# Patient Record
Sex: Female | Born: 1955 | Race: White | Hispanic: No | Marital: Married | Smoking: Former smoker
Health system: Southern US, Community
[De-identification: ages and names within clinical notes are randomized; demographics above are authoritative.]

## PROBLEM LIST (undated history)

## (undated) DIAGNOSIS — R55 Syncope and collapse: Secondary | ICD-10-CM

## (undated) DIAGNOSIS — Z86010 Personal history of colonic polyps: Secondary | ICD-10-CM

## (undated) DIAGNOSIS — F32A Depression, unspecified: Secondary | ICD-10-CM

## (undated) DIAGNOSIS — Z860101 Personal history of adenomatous and serrated colon polyps: Secondary | ICD-10-CM

## (undated) DIAGNOSIS — F329 Major depressive disorder, single episode, unspecified: Secondary | ICD-10-CM

## (undated) DIAGNOSIS — I6523 Occlusion and stenosis of bilateral carotid arteries: Secondary | ICD-10-CM

## (undated) DIAGNOSIS — K219 Gastro-esophageal reflux disease without esophagitis: Secondary | ICD-10-CM

## (undated) DIAGNOSIS — C519 Malignant neoplasm of vulva, unspecified: Secondary | ICD-10-CM

## (undated) DIAGNOSIS — J439 Emphysema, unspecified: Secondary | ICD-10-CM

## (undated) DIAGNOSIS — I2510 Atherosclerotic heart disease of native coronary artery without angina pectoris: Secondary | ICD-10-CM

## (undated) DIAGNOSIS — I495 Sick sinus syndrome: Secondary | ICD-10-CM

## (undated) DIAGNOSIS — R001 Bradycardia, unspecified: Secondary | ICD-10-CM

## (undated) DIAGNOSIS — D649 Anemia, unspecified: Secondary | ICD-10-CM

## (undated) DIAGNOSIS — M1990 Unspecified osteoarthritis, unspecified site: Secondary | ICD-10-CM

## (undated) DIAGNOSIS — I341 Nonrheumatic mitral (valve) prolapse: Secondary | ICD-10-CM

## (undated) DIAGNOSIS — I639 Cerebral infarction, unspecified: Secondary | ICD-10-CM

## (undated) DIAGNOSIS — I4891 Unspecified atrial fibrillation: Secondary | ICD-10-CM

## (undated) DIAGNOSIS — F419 Anxiety disorder, unspecified: Secondary | ICD-10-CM

## (undated) DIAGNOSIS — I10 Essential (primary) hypertension: Secondary | ICD-10-CM

## (undated) DIAGNOSIS — G459 Transient cerebral ischemic attack, unspecified: Secondary | ICD-10-CM

## (undated) DIAGNOSIS — E785 Hyperlipidemia, unspecified: Secondary | ICD-10-CM

## (undated) DIAGNOSIS — F5104 Psychophysiologic insomnia: Secondary | ICD-10-CM

## (undated) HISTORY — PX: BACK SURGERY: SHX140

## (undated) HISTORY — PX: INTESTINAL MALROTATION REPAIR: SHX411

## (undated) HISTORY — PX: BREAST EXCISIONAL BIOPSY: SUR124

## (undated) HISTORY — PX: DILATION AND CURETTAGE OF UTERUS: SHX78

## (undated) HISTORY — PX: LUMBAR DISC SURGERY: SHX700

## (undated) HISTORY — PX: CARDIAC CATHETERIZATION: SHX172

## (undated) HISTORY — PX: DIAGNOSTIC LAPAROSCOPY: SUR761

## (undated) HISTORY — PX: COLONOSCOPY: SHX174

---

## 1995-01-23 HISTORY — PX: OOPHORECTOMY: SHX86

## 1995-01-23 HISTORY — PX: ABDOMINAL HYSTERECTOMY: SHX81

## 1996-01-23 DIAGNOSIS — C519 Malignant neoplasm of vulva, unspecified: Secondary | ICD-10-CM

## 1996-01-23 HISTORY — DX: Malignant neoplasm of vulva, unspecified: C51.9

## 1996-01-23 HISTORY — PX: VULVECTOMY PARTIAL: SHX6187

## 2003-01-23 HISTORY — PX: APPENDECTOMY: SHX54

## 2003-01-23 HISTORY — PX: CHOLECYSTECTOMY OPEN: SUR202

## 2003-03-02 ENCOUNTER — Other Ambulatory Visit: Payer: Self-pay

## 2004-06-21 ENCOUNTER — Observation Stay: Payer: Self-pay | Admitting: Internal Medicine

## 2004-06-21 ENCOUNTER — Other Ambulatory Visit: Payer: Self-pay

## 2006-01-22 DIAGNOSIS — R55 Syncope and collapse: Secondary | ICD-10-CM

## 2006-01-22 HISTORY — DX: Syncope and collapse: R55

## 2006-07-12 ENCOUNTER — Emergency Department (HOSPITAL_COMMUNITY): Admission: EM | Admit: 2006-07-12 | Discharge: 2006-07-13 | Payer: Self-pay | Admitting: Emergency Medicine

## 2006-10-24 ENCOUNTER — Other Ambulatory Visit: Payer: Self-pay

## 2006-10-24 ENCOUNTER — Inpatient Hospital Stay: Payer: Self-pay | Admitting: Internal Medicine

## 2006-10-29 ENCOUNTER — Ambulatory Visit: Payer: Self-pay | Admitting: Internal Medicine

## 2008-11-02 ENCOUNTER — Emergency Department: Payer: Self-pay | Admitting: Emergency Medicine

## 2008-11-03 ENCOUNTER — Ambulatory Visit: Payer: Self-pay | Admitting: Orthopedic Surgery

## 2008-11-05 ENCOUNTER — Ambulatory Visit: Payer: Self-pay | Admitting: Orthopedic Surgery

## 2008-11-12 ENCOUNTER — Ambulatory Visit: Payer: Self-pay | Admitting: Unknown Physician Specialty

## 2008-11-16 ENCOUNTER — Ambulatory Visit: Payer: Self-pay | Admitting: Unknown Physician Specialty

## 2009-05-07 ENCOUNTER — Ambulatory Visit: Payer: Self-pay

## 2009-09-02 ENCOUNTER — Emergency Department: Payer: Self-pay | Admitting: Emergency Medicine

## 2009-11-11 ENCOUNTER — Emergency Department: Payer: Self-pay | Admitting: Emergency Medicine

## 2010-06-02 ENCOUNTER — Ambulatory Visit: Payer: Self-pay | Admitting: Unknown Physician Specialty

## 2010-11-08 LAB — ALKALINE PHOSPHATASE: Alkaline Phosphatase: 80

## 2011-02-01 ENCOUNTER — Emergency Department: Payer: Self-pay | Admitting: Unknown Physician Specialty

## 2011-02-01 LAB — CBC
HGB: 14 g/dL (ref 12.0–16.0)
MCHC: 33.1 g/dL (ref 32.0–36.0)
Platelet: 243 10*3/uL (ref 150–440)
RDW: 14 % (ref 11.5–14.5)

## 2011-02-01 LAB — CK TOTAL AND CKMB (NOT AT ARMC)
CK, Total: 77 U/L (ref 21–215)
CK-MB: 1 ng/mL (ref 0.5–3.6)

## 2011-02-01 LAB — COMPREHENSIVE METABOLIC PANEL
Alkaline Phosphatase: 60 U/L (ref 50–136)
BUN: 19 mg/dL — ABNORMAL HIGH (ref 7–18)
Bilirubin,Total: 0.5 mg/dL (ref 0.2–1.0)
Creatinine: 0.74 mg/dL (ref 0.60–1.30)
EGFR (Non-African Amer.): >60
Glucose: 120 mg/dL — ABNORMAL HIGH (ref 65–99)
SGOT(AST): 17 U/L (ref 15–37)
SGPT (ALT): 23 U/L

## 2011-07-17 ENCOUNTER — Ambulatory Visit: Payer: Self-pay | Admitting: Internal Medicine

## 2012-01-11 ENCOUNTER — Ambulatory Visit: Payer: Self-pay | Admitting: General Practice

## 2012-09-16 ENCOUNTER — Ambulatory Visit: Payer: Self-pay

## 2012-09-26 ENCOUNTER — Ambulatory Visit: Payer: Self-pay

## 2012-10-02 ENCOUNTER — Ambulatory Visit: Payer: Self-pay

## 2012-10-02 HISTORY — PX: BREAST BIOPSY: SHX20

## 2013-03-30 ENCOUNTER — Ambulatory Visit: Payer: Self-pay | Admitting: Unknown Physician Specialty

## 2013-04-02 LAB — PATHOLOGY REPORT

## 2013-09-17 ENCOUNTER — Ambulatory Visit: Payer: Self-pay

## 2014-05-06 ENCOUNTER — Observation Stay
Admit: 2014-05-06 | Disposition: A | Discharge status: Home / Self Care | Payer: Self-pay | Attending: Internal Medicine | Admitting: Internal Medicine

## 2014-05-06 LAB — APTT: Activated PTT: 29 secs (ref 23.6–35.9)

## 2014-05-06 LAB — PROTIME-INR
INR: 1
PROTHROMBIN TIME: 13.6 s

## 2014-05-06 LAB — CBC
HCT: 38.1 % (ref 35.0–47.0)
HGB: 12.6 g/dL (ref 12.0–16.0)
MCH: 28.3 pg (ref 26.0–34.0)
MCHC: 33 g/dL (ref 32.0–36.0)
MCV: 86 fL (ref 80–100)
PLATELETS: 235 10*3/uL (ref 150–440)
RBC: 4.45 10*6/uL (ref 3.80–5.20)
RDW: 14 % (ref 11.5–14.5)
WBC: 7.5 10*3/uL (ref 3.6–11.0)

## 2014-05-06 LAB — BASIC METABOLIC PANEL
ANION GAP: 7 (ref 7–16)
BUN: 18 mg/dL
CALCIUM: 8.7 mg/dL — AB
CHLORIDE: 106 mmol/L
Co2: 24 mmol/L
Creatinine: 0.7 mg/dL
EGFR (African American): >60
Glucose: 124 mg/dL — ABNORMAL HIGH
Potassium: 3.5 mmol/L
Sodium: 137 mmol/L

## 2014-05-06 LAB — TROPONIN I: Troponin-I: <0.03 ng/mL

## 2014-05-06 LAB — CK TOTAL AND CKMB (NOT AT ARMC)
CK, Total: 58 U/L
CK, Total: 57 U/L
CK, Total: 58 U/L
CK-MB: 1.3 ng/mL
CK-MB: 1.4 ng/mL
CK-MB: 1.4 ng/mL

## 2014-05-06 LAB — PRO B NATRIURETIC PEPTIDE: B-TYPE NATIURETIC PEPTID: 65 pg/mL

## 2014-05-07 LAB — LIPID PANEL
CHOLESTEROL: 212 mg/dL — AB
HDL Cholesterol: 57 mg/dL
LDL CHOLESTEROL, CALC: 120 mg/dL — AB
Triglycerides: 177 mg/dL — ABNORMAL HIGH
VLDL CHOLESTEROL, CALC: 35 mg/dL

## 2014-05-07 LAB — BASIC METABOLIC PANEL
Anion Gap: 3 — ABNORMAL LOW (ref 7–16)
BUN: 15 mg/dL
CALCIUM: 8.4 mg/dL — AB
CHLORIDE: 107 mmol/L
CO2: 27 mmol/L
Creatinine: 0.63 mg/dL
EGFR (African American): >60
EGFR (Non-African Amer.): >60
Glucose: 112 mg/dL — ABNORMAL HIGH
POTASSIUM: 3.9 mmol/L
SODIUM: 137 mmol/L

## 2014-05-07 LAB — CBC WITH DIFFERENTIAL/PLATELET
BASOS PCT: 0.4 %
Basophil #: 0 10*3/uL (ref 0.0–0.1)
EOS PCT: 0.9 %
Eosinophil #: 0.1 10*3/uL (ref 0.0–0.7)
HCT: 36.9 % (ref 35.0–47.0)
HGB: 12.2 g/dL (ref 12.0–16.0)
LYMPHS ABS: 1.8 10*3/uL (ref 1.0–3.6)
Lymphocyte %: 26.7 %
MCH: 28.2 pg (ref 26.0–34.0)
MCHC: 33 g/dL (ref 32.0–36.0)
MCV: 86 fL (ref 80–100)
MONO ABS: 0.6 x10 3/mm (ref 0.2–0.9)
Monocyte %: 9.3 %
NEUTROS ABS: 4.3 10*3/uL (ref 1.4–6.5)
Neutrophil %: 62.7 %
Platelet: 227 10*3/uL (ref 150–440)
RBC: 4.32 10*6/uL (ref 3.80–5.20)
RDW: 14 % (ref 11.5–14.5)
WBC: 6.9 10*3/uL (ref 3.6–11.0)

## 2014-05-23 NOTE — Discharge Summary (Signed)
PATIENT NAME:  Renee [name], Renee [name] MR#:  [id number] DATE OF BIRTH:  1955/07/19  DATE OF ADMISSION:  05/06/2014 DATE OF DISCHARGE:  05/07/2014  PRESENTING COMPLAINT: Chest pain.   DISCHARGE DIAGNOSES: 1. Chest pain with cardiac work-up essentially negative.  2. Hypertension.  3. Myoview stress test negative. Ejection fraction 60%.   CODE STATUS: FULL CODE.   MEDICATIONS: 1. Zolpidem 5 mg 1 tablet at bedtime.  2. Aspirin 81 mg daily.  3. Fluoxetine 20 mg 2 tablets p.o. daily.  4. Folic acid 0.8 mg p.o. daily.  5. Acetaminophen/hydrocodone 325/5 one tablet 4 times a day as needed.  6. Ferrous fumarate 50 mg p.o. daily.  7. Losartan 50 mg daily.  8. Multivitamin p.o. daily.  9. Omeprazole 40 mg daily.  10. Premarin 0.625 p.o. daily.  11. Vitamin D3 at 1000 international units p.o. daily.  12. Alprazolam 0.25 p.o. every 8 hours as needed.  13. Zofran 4 mg 3 times a day as needed.   FOLLOW-UP:   1. Follow-up with Serafina Royals, MD, in 1-2 weeks.  2. Follow-up with Adrian Prows, MD next week.   LABORATORY AND IMAGING:  Cardiac enzymes x3 negative. Myoview stress test negative.   CONSULTATIONS: Cardiology consultation, Lujean Amel, MD.   [place] COURSE: Renee [name] is a 59 year old Caucasian female with history of hypertension,  complains of chest pain radiating to the shoulder associated with shortness of breath and dizziness. She was admitted with:  1. Chest pain with negative cardiac work-up including cardiac enzymes, no EKG changes, and  Myoview stress test. She was seen by Dr. Clayborn Bigness and recommends outpatient follow-up with Dr. [name]. She had a cardiac work-up with negative stress test 2 years ago and a cardiac catheterization of a few years ago was negative as well.  2. Hypertension. Resumed home medications.  3. Hyperlipidemia, statins.  4. Chronic low back pain.  P.r.n. Percocet.   Overall, the patient's hospital stay otherwise remained  stable. Discharge plan was discussed with the patient. She is agreeable to it.   TIME SPENT: 40 minutes.      ____________________________ Hart Rochester Posey Pronto, MD sap:tr D: 05/08/2014 06:56:32 ET T: 05/08/2014 12:11:23 ET JOB#: [id number]  cc: Cadon Raczka A. Posey Pronto, MD, <Dictator> Ilda Basset MD ELECTRONICALLY SIGNED 05/12/2014 10:34

## 2014-05-23 NOTE — Consult Note (Signed)
PATIENT NAME:  Renee [name], Renee [name] MR#:  [id number] DATE OF BIRTH:  [birth year]  DATE OF CONSULTATION:  05/06/2014  REFERRING PHYSICIAN:  Nicholes Mango, MD CONSULTING PHYSICIAN:  Dwayne D. Clayborn Bigness, MD  PRIMARY CARE PHYSICIAN: Adrian Prows, MD  INDICATION: Chest pain.   HISTORY OF PRESENT ILLNESS: The patient is a 59 year old female with past history of hypertension, hyperlipidemia, GERD, and family history of cardiac disease who presented to the Emergency Room with recurrent chest pain symptoms, sudden onset, intermittent episodes of shortness of breath and dyspnea, waxing and waning, radiation to the shoulder. The patient complains of dizziness, fainting feeling sensation. Her blood pressure was elevated up to 190. She has a significant family history for significant coronary artery disease. Had some chest discomfort. The patient was given Lovenox and was advised to be admitted. Ruled out for myocardial infarction. Now presents for functional study.   PAST MEDICAL HISTORY: Hypertension, hyperlipidemia, GERD, anxiety, mitral valve prolapse, arthritis, tachybrady syndrome.   PAST SURGICAL HISTORY: Appendectomy, cholecystectomy, hysterectomy, malrotation surgery.   ALLERGIES: MORPHINE, ACE INHIBITOR, PREDNISONE.   SOCIAL HISTORY: Lives with her husband. Stopped smoking a year ago. Occasional intake of alcohol. Denies any illicit drug use.   FAMILY HISTORY: Myocardial infarction, hypertension, coronary artery disease.  REVIEW OF SYSTEMS: No blackout spells or syncope. No nausea, no vomiting. No fever, no chills, no sweats. No weight loss, no weight gain. No hemoptysis or hematemesis. No bright red blood per rectum. No vision change or hearing change. Denies sputum production or cough.   PHYSICAL EXAMINATION: VITAL SIGNS: Blood pressure 175/70, pulse 60, respiratory rate 18, afebrile.  HEENT: Normocephalic, atraumatic. Pupils equal and reactive to light.  NECK: Supple. No significant JVD,  bruits or adenopathy.  LUNGS: Clear to auscultation and percussion. No significant wheeze, rhonchi, or rale.  HEART: Regular rate and rhythm.  ABDOMEN: Positive bowel sounds. No rebound tenderness.  EXTREMITY: Within normal limits.  NEUROLOGIC: Intact.  SKIN: Normal.  HOME MEDICATIONS: Percocet 5/325 four times a day as needed, alprazolam 0.25 every 8 hours for anxiety, iron once a day, fluoxetine 20 mg once a day, folic acid 0.8 mg once a day, losartan 50 mg daily, multivitamin once a day, omeprazole 40 mg a day, Zofran 4 mg 3 times as needed, Premarin 0.625 daily, vitamin D daily, Ambien p.r.n.  DIAGNOSTIC DATA: Chest x-ray negative.   Troponin 0.03. White count normal. PT/INR normal. Glucose 124. BNP 65. BMP normal.   EKG: Normal sinus rhythm with bradycardia, rate of 55, otherwise negative.   ASSESSMENT:  1.  Unstable angina, chest pain. 2.  Hypertension. 3.  Hyperlipidemia. 4.  Reflux. 5.  Chronic lower back pain. 6.  Bradycardia. 7.  Mitral valve prolapse. 8.  Mild obesity.   PLAN: Agree with admit. Rule out for myocardial infarction. Consider functional study. Continue treatment for anxiety. Echocardiogram may be helpful. Continue hypertension control with Losartan. Recommend statin therapy for lipids. Continue omeprazole or Protonix for reflux symptoms. Continue pain management for low back pain. Recommend weight loss and exercise. Consider low-dose beta-blockade for mitral valve prolapse. Continue deep venous thrombosis prophylaxis. Treatment for headaches. Treat the patient conservatively and medically unless symptoms persist or worsen. Have the patient follow up with cardiology as an outpatient.  ____________________________ Loran Senters. Clayborn Bigness, MD ddc:sb D: 05/07/2014 15:35:00 ET T: 05/07/2014 15:52:59 ET JOB#: [id number]  cc: Dwayne D. Clayborn Bigness, MD, <Dictator> Yolonda Kida MD ELECTRONICALLY SIGNED 05/08/2014 9:31

## 2014-05-23 NOTE — H&P (Signed)
PATIENT NAME:  Renee [name], COMMERFORD MR#:  [id number] DATE OF BIRTH:  [birth year]  DATE OF ADMISSION:  05/06/2014  PRIMARY CARE PHYSICIAN: Cheral Marker. Ola Spurr, MD   REFERRING EMERGENCY DEPARTMENT PHYSICIAN: Yetta Numbers. Karma Greaser, MD   CHIEF COMPLAINT: Chest pain.   HISTORY OF PRESENT ILLNESS: The patient is a 59 year old female with past medical history of hypertension, hyperlipidemia, and GERD who has presented to the ED with a chief complaint of chest pain. It is sudden onset with intermittent episodes of shortness of breath, waxing and waning kind of chest pain, radiating to the shoulder, back. The patient was also complaining of dizziness and fainting-like sensation. Her blood pressure is elevated at 187/78. The patient's significant family history, her sister has significant coronary artery disease and CABG. Initial troponin is negative. The patient is still complaining of chest discomfort. The patient was given Lovenox therapeutic dose and hospitalist team is called to admit the patient. During my examination, the patient is feeling better, but some chest discomfort is present, which is vague.  PAST MEDICAL HISTORY: Hypertension, hyperlipidemia, GERD, anxiety, mitral valve prolapse, takes antibiotics for dental procedures and other procedures, arthritis, probable history of tachybrady syndrome.   PAST SURGICAL HISTORY: Appendectomy, cholecystectomy, hysterectomy, malrotation surgery.  ALLERGIES: MORPHINE, ACE INHIBITOR, AND PREDNISONE.   PSYCHOSOCIAL HISTORY: Lives at home with husband. Stopped smoking a year ago. Occasional intake of alcohol. Denies any illicit drug usage.   FAMILY HISTORY: Sister with myocardial infarction at the age of 64 and hypertension runs in her family.  REVIEW OF SYSTEMS: CONSTITUTIONAL: Denies any fever, fatigue, weakness. EYES: Denies blurry vision, double vision. EARS, NOSE, AND THROAT: Denies epistaxis, discharge.  RESPIRATORY: Denies cough, COPD.  CARDIOVASCULAR:  Complaining of chest discomfort, dizziness. No palpitations.  GASTROINTESTINAL: Denies nausea, vomiting, diarrhea, abdominal pain, hematemesis.  GENITOURINARY: No dysuria, hematuria.  ENDOCRINOLOGIC: Denies polyuria, nocturia, thyroid problems.  HEMATOLOGIC AND LYMPHATIC: No anemia, easy bruising, bleeding.  INTEGUMENTARY: No rashes. No lesions.  MUSCULOSKELETAL: No joint pain in the neck and back. Denies any gout.  NEUROLOGIC: Denies vertigo or ataxia.  PSYCHIATRIC: No ADD or OCD.   HOME MEDICATIONS: Percocet 5/325 one tablet p.o. 4 times a day as needed for pain, alprazolam 0.25 mg p.o. every 8 hours as needed for anxiety, aspirin 81 mg once daily, iron sulfate 50 mg p.o. once daily, fluoxetine 20 mg p.o. once daily, fluoxetine 20 mg 2 tablets p.o. once daily, folic acid 0.8 mg once daily, losartan 50 mg p.o. once daily, multivitamin once daily, omeprazole 40 mg p.o. once daily, Zofran 4 mg p.o. 3 times a day as needed for nausea and vomiting, Premarin 0.625 mg 1 p.o. once daily, vitamin D3 at 1000 international units 1 capsule p.o. once daily, Ambien 5 mg p.o. once daily.   PHYSICAL EXAMINATION:  VITAL SIGNS: Temperature is 97.9, pulse 52 ,blood pressure is initially 187/78, subsequently it was 175/66, pulse oximetry is 97%, respirations 20.  GENERAL APPEARANCE: Not in acute distress. Moderately built and obese.  HEENT: Normocephalic, atraumatic. Pupils are equally reacting to light and accommodation. No scleral icterus. No conjunctival injection. No sinus tenderness. No postnasal drip. Moist mucous membranes.  NECK: Supple. No JVD. No thyromegaly. No masses.  LUNGS: Clear to auscultation bilaterally. No crackles, no wheezing.  CARDIAC: S1, S2 normal. Regular rate and rhythm. Positive murmur from mitral valve prolapse. No bruits. GASTROINTESTINAL: Soft, obese. Bowel sounds are positive in all 4 quadrants. Nontender, nondistended. No masses.  NEUROLOGICAL: Awake, alert, oriented x3. Cranial  nerves II-XII are  grossly intact. Motor and sensory are intact. Reflexes are 2+.  EXTREMITIES: No edema. No cyanosis. No clubbing.  SKIN: Warm to touch. Normal turgor. No rashes. No lesions.  MUSCULOSKELETAL: No joint effusion, tenderness, edema. PSYCHIATRIC: Normal mood and affect.   LABORATORY AND IMAGING STUDIES: Chest x-ray: No active disease. Troponin less than 0.03. CBC normal. PT-INR is normal. Glucose 124. BNP 65. The rest of the BMP is normal.   A 12-lead EKG: Sinus bradycardia at 54 beats per minute, normal PR interval, no acute ST-T wave changes.   ASSESSMENT AND PLAN: A 59 year old Caucasian female presenting to the ED with a chief complaint of chest pain radiating to the shoulder associated with shortness of breath and dizziness, somewhat nauseous with initial troponin negative, but still complaining of chest discomfort. Will be admitted with the following assessment and plan:  1.  Unstable angina. Admit her to telemetry. Cycle cardiac biomarkers. The patient will be on aspirin, beta blocker, statin, Lovenox 1 mg/kg subcutaneous q. 12 hours. The patient was seen by Dr. [name] in the past and had cardiac catheterization 9 years ago which was normal, according to the patient. Stress test 2 years ago is normal. We will cycle cardiac biomarkers and repeat stress test in a.m. Cardiology consult is placed with Dr. [name]. Continue Lovenox 1 mg/kg subcutaneous q. 12 hours while cycling cardiac biomarkers.  2.  Hypertension. Resume her home medication of losartan, and the patient will be continued on small dose of beta blocker. Provide nitroglycerin as needed basis.  3.  Hyperlipidemia. Check fasting lipid panel and the patient will be on statin.  4.  Gastroesophageal reflux disease. The patient will be on Protonix.  5.  Chronic low back pain. Pain management with Percocet as needed basis.  6.  Has sinus bradycardia with history of tachybrady syndrome, currently asymptomatic. Will continue  close monitoring on telemetry. 7.  History of mitral valve prolapse and uses prophylactic antibiotics before procedures.  8.  We will provide gastrointestinal prophylaxis. Deep venous thrombosis prophylaxis is not needed as the patient is on full dose of Lovenox.   CODE STATUS:  She is full code. Her husband is the medical power of attorney.   Plan of care discussed in detail with the patient and her husband at bedside. They both verbalized understanding of the plan.   ____________________________ Nicholes Mango, MD ag:TM D: 05/06/2014 17:54:30 ET T: 05/06/2014 19:19:22 ET JOB#: [id number]  cc: Nicholes Mango, MD, <Dictator> Cheral Marker. Ola Spurr, MD Corey Skains, MD Nicholes Mango MD ELECTRONICALLY SIGNED 05/10/2014 17:02

## 2014-07-03 ENCOUNTER — Emergency Department: Payer: PRIVATE HEALTH INSURANCE

## 2014-07-03 ENCOUNTER — Inpatient Hospital Stay
Admit: 2014-07-03 | Discharge: 2014-07-03 | Disposition: A | Discharge status: Home / Self Care | Payer: PRIVATE HEALTH INSURANCE | Attending: Internal Medicine | Admitting: Internal Medicine

## 2014-07-03 ENCOUNTER — Inpatient Hospital Stay: Payer: PRIVATE HEALTH INSURANCE

## 2014-07-03 ENCOUNTER — Encounter: Payer: Self-pay | Admitting: Emergency Medicine

## 2014-07-03 ENCOUNTER — Inpatient Hospital Stay
Admission: EM | Admit: 2014-07-03 | Discharge: 2014-07-05 | DRG: 310 | Disposition: A | Discharge status: Home / Self Care | Payer: PRIVATE HEALTH INSURANCE | Attending: Internal Medicine | Admitting: Internal Medicine

## 2014-07-03 DIAGNOSIS — E669 Obesity, unspecified: Secondary | ICD-10-CM | POA: Diagnosis present

## 2014-07-03 DIAGNOSIS — I4891 Unspecified atrial fibrillation: Principal | ICD-10-CM | POA: Diagnosis present

## 2014-07-03 DIAGNOSIS — R000 Tachycardia, unspecified: Secondary | ICD-10-CM | POA: Diagnosis present

## 2014-07-03 DIAGNOSIS — R001 Bradycardia, unspecified: Secondary | ICD-10-CM | POA: Diagnosis present

## 2014-07-03 DIAGNOSIS — I639 Cerebral infarction, unspecified: Secondary | ICD-10-CM

## 2014-07-03 DIAGNOSIS — I10 Essential (primary) hypertension: Secondary | ICD-10-CM | POA: Diagnosis present

## 2014-07-03 DIAGNOSIS — F419 Anxiety disorder, unspecified: Secondary | ICD-10-CM | POA: Diagnosis present

## 2014-07-03 DIAGNOSIS — I341 Nonrheumatic mitral (valve) prolapse: Secondary | ICD-10-CM | POA: Diagnosis present

## 2014-07-03 DIAGNOSIS — Z87891 Personal history of nicotine dependence: Secondary | ICD-10-CM | POA: Diagnosis not present

## 2014-07-03 DIAGNOSIS — Z9049 Acquired absence of other specified parts of digestive tract: Secondary | ICD-10-CM | POA: Diagnosis present

## 2014-07-03 DIAGNOSIS — Z8249 Family history of ischemic heart disease and other diseases of the circulatory system: Secondary | ICD-10-CM | POA: Diagnosis not present

## 2014-07-03 DIAGNOSIS — Z79899 Other long term (current) drug therapy: Secondary | ICD-10-CM

## 2014-07-03 DIAGNOSIS — K219 Gastro-esophageal reflux disease without esophagitis: Secondary | ICD-10-CM | POA: Diagnosis present

## 2014-07-03 DIAGNOSIS — R42 Dizziness and giddiness: Secondary | ICD-10-CM

## 2014-07-03 DIAGNOSIS — E785 Hyperlipidemia, unspecified: Secondary | ICD-10-CM | POA: Diagnosis present

## 2014-07-03 DIAGNOSIS — Z90710 Acquired absence of both cervix and uterus: Secondary | ICD-10-CM | POA: Diagnosis not present

## 2014-07-03 DIAGNOSIS — Z7982 Long term (current) use of aspirin: Secondary | ICD-10-CM | POA: Diagnosis not present

## 2014-07-03 DIAGNOSIS — Z6834 Body mass index (BMI) 34.0-34.9, adult: Secondary | ICD-10-CM

## 2014-07-03 DIAGNOSIS — I481 Persistent atrial fibrillation: Secondary | ICD-10-CM | POA: Diagnosis not present

## 2014-07-03 HISTORY — DX: Bradycardia, unspecified: R00.1

## 2014-07-03 HISTORY — DX: Nonrheumatic mitral (valve) prolapse: I34.1

## 2014-07-03 HISTORY — DX: Hyperlipidemia, unspecified: E78.5

## 2014-07-03 HISTORY — DX: Essential (primary) hypertension: I10

## 2014-07-03 LAB — BRAIN NATRIURETIC PEPTIDE: B Natriuretic Peptide: 114 pg/mL — ABNORMAL HIGH (ref 0.0–100.0)

## 2014-07-03 LAB — CBC
HCT: 42.8 % (ref 35.0–47.0)
HEMATOCRIT: 42.8 % (ref 35.0–47.0)
HEMOGLOBIN: 14.1 g/dL (ref 12.0–16.0)
Hemoglobin: 14 g/dL (ref 12.0–16.0)
MCH: 28.1 pg (ref 26.0–34.0)
MCH: 28.1 pg (ref 26.0–34.0)
MCHC: 32.9 g/dL (ref 32.0–36.0)
MCHC: 32.8 g/dL (ref 32.0–36.0)
MCV: 85.6 fL (ref 80.0–100.0)
MCV: 85.6 fL (ref 80.0–100.0)
Platelets: 266 10*3/uL (ref 150–440)
Platelets: 262 10*3/uL (ref 150–440)
RBC: 5 MIL/uL (ref 3.80–5.20)
RBC: 5 MIL/uL (ref 3.80–5.20)
RDW: 14 % (ref 11.5–14.5)
RDW: 14.2 % (ref 11.5–14.5)
WBC: 8.5 10*3/uL (ref 3.6–11.0)
WBC: 8 10*3/uL (ref 3.6–11.0)

## 2014-07-03 LAB — TROPONIN I
TROPONIN I: 0.03 ng/mL (ref <0.031)
TROPONIN I: 0.04 ng/mL — AB (ref <0.031)
Troponin I: <0.03 ng/mL (ref <0.031)
Troponin I: 0.03 ng/mL (ref <0.031)

## 2014-07-03 LAB — COMPREHENSIVE METABOLIC PANEL
ALBUMIN: 4 g/dL (ref 3.5–5.0)
ALT: 25 U/L (ref 14–54)
AST: 27 U/L (ref 15–41)
Alkaline Phosphatase: 72 U/L (ref 38–126)
Anion gap: 10 (ref 5–15)
BILIRUBIN TOTAL: 0.4 mg/dL (ref 0.3–1.2)
BUN: 16 mg/dL (ref 6–20)
CO2: 23 mmol/L (ref 22–32)
CREATININE: 0.69 mg/dL (ref 0.44–1.00)
Calcium: 9.3 mg/dL (ref 8.9–10.3)
Chloride: 107 mmol/L (ref 101–111)
GFR calc Af Amer: >60 mL/min (ref >60)
GFR calc non Af Amer: >60 mL/min (ref >60)
GLUCOSE: 112 mg/dL — AB (ref 65–99)
POTASSIUM: 3.8 mmol/L (ref 3.5–5.1)
SODIUM: 140 mmol/L (ref 135–145)
Total Protein: 7.9 g/dL (ref 6.5–8.1)

## 2014-07-03 LAB — MRSA PCR SCREENING: MRSA by PCR: NEGATIVE

## 2014-07-03 LAB — LIPID PANEL
CHOL/HDL RATIO: 3.4 ratio
CHOLESTEROL: 251 mg/dL — AB (ref 0–200)
HDL: 74 mg/dL (ref >40)
LDL CALC: 156 mg/dL — AB (ref 0–99)
Triglycerides: 103 mg/dL (ref <150)
VLDL: 21 mg/dL (ref 0–40)

## 2014-07-03 LAB — CREATININE, SERUM
Creatinine, Ser: 0.68 mg/dL (ref 0.44–1.00)
GFR calc Af Amer: >60 mL/min (ref >60)

## 2014-07-03 LAB — GLUCOSE, CAPILLARY: Glucose-Capillary: 85 mg/dL (ref 65–99)

## 2014-07-03 MED ORDER — SODIUM CHLORIDE 0.9 % IJ SOLN
3.0000 mL | Freq: Two times a day (BID) | INTRAMUSCULAR | Status: DC
  Administered 2014-07-03 – 2014-07-04 (×3): 3 mL via INTRAVENOUS

## 2014-07-03 MED ORDER — ASPIRIN 81 MG PO CHEW: 324.0000 mg | CHEWABLE_TABLET | Freq: Once | ORAL | Status: DC

## 2014-07-03 MED ORDER — ALPRAZOLAM 0.25 MG PO TABS
0.2500 mg | ORAL_TABLET | Freq: Every evening | ORAL | Status: DC | PRN
  Administered 2014-07-04: 0.25 mg via ORAL
  Filled 2014-07-03: qty 1

## 2014-07-03 MED ORDER — ATORVASTATIN CALCIUM 20 MG PO TABS
40.0000 mg | ORAL_TABLET | Freq: Every day | ORAL | Status: DC
  Administered 2014-07-03 – 2014-07-04 (×2): 40 mg via ORAL
  Filled 2014-07-03 (×2): qty 2

## 2014-07-03 MED ORDER — SODIUM CHLORIDE 0.9 % IV SOLN
1000.0000 mL | Freq: Once | INTRAVENOUS | Status: AC
  Administered 2014-07-03: 1000 mL via INTRAVENOUS

## 2014-07-03 MED ORDER — HYDROMORPHONE HCL 1 MG/ML IJ SOLN
0.5000 mg | Freq: Once | INTRAMUSCULAR | Status: AC
  Administered 2014-07-03: 0.5 mg via INTRAVENOUS

## 2014-07-03 MED ORDER — PANTOPRAZOLE SODIUM 40 MG PO TBEC
40.0000 mg | DELAYED_RELEASE_TABLET | Freq: Every day | ORAL | Status: DC
  Administered 2014-07-03 – 2014-07-05 (×3): 40 mg via ORAL
  Filled 2014-07-03 (×3): qty 1

## 2014-07-03 MED ORDER — HEPARIN SODIUM (PORCINE) 5000 UNIT/ML IJ SOLN
5000.0000 [IU] | Freq: Three times a day (TID) | INTRAMUSCULAR | Status: DC
  Administered 2014-07-03 – 2014-07-05 (×7): 5000 [IU] via SUBCUTANEOUS
  Filled 2014-07-03 (×7): qty 1

## 2014-07-03 MED ORDER — AMIODARONE HCL IN DEXTROSE 360-4.14 MG/200ML-% IV SOLN
60.0000 mg/h | INTRAVENOUS | Status: AC
  Administered 2014-07-03: 60 mg/h via INTRAVENOUS
  Filled 2014-07-03: qty 200

## 2014-07-03 MED ORDER — CLOPIDOGREL BISULFATE 75 MG PO TABS
ORAL_TABLET | ORAL | Status: AC
  Filled 2014-07-03: qty 1

## 2014-07-03 MED ORDER — ASPIRIN 81 MG PO CHEW
CHEWABLE_TABLET | ORAL | Status: AC
  Filled 2014-07-03: qty 4

## 2014-07-03 MED ORDER — OXYCODONE HCL 5 MG PO TABS
5.0000 mg | ORAL_TABLET | Freq: Once | ORAL | Status: AC
  Administered 2014-07-03: 5 mg via ORAL

## 2014-07-03 MED ORDER — CLOPIDOGREL BISULFATE 75 MG PO TABS
75.0000 mg | ORAL_TABLET | Freq: Every day | ORAL | Status: DC
  Administered 2014-07-03 – 2014-07-05 (×3): 75 mg via ORAL
  Filled 2014-07-03 (×2): qty 1

## 2014-07-03 MED ORDER — ASPIRIN 81 MG PO CHEW
162.0000 mg | CHEWABLE_TABLET | Freq: Once | ORAL | Status: AC
  Administered 2014-07-03: 162 mg via ORAL

## 2014-07-03 MED ORDER — FLUOXETINE HCL 20 MG PO TABS
40.0000 mg | ORAL_TABLET | Freq: Every day | ORAL | Status: DC
  Administered 2014-07-03 – 2014-07-05 (×3): 40 mg via ORAL
  Filled 2014-07-03 (×6): qty 2

## 2014-07-03 MED ORDER — FUROSEMIDE 10 MG/ML IJ SOLN
INTRAMUSCULAR | Status: AC
  Filled 2014-07-03: qty 4

## 2014-07-03 MED ORDER — VITAMIN D 1000 UNITS PO TABS
1000.0000 [IU] | ORAL_TABLET | Freq: Every day | ORAL | Status: DC
  Administered 2014-07-03 – 2014-07-05 (×3): 1000 [IU] via ORAL
  Filled 2014-07-03 (×3): qty 1

## 2014-07-03 MED ORDER — AMIODARONE HCL IN DEXTROSE 360-4.14 MG/200ML-% IV SOLN
INTRAVENOUS | Status: AC
  Filled 2014-07-03: qty 200

## 2014-07-03 MED ORDER — ONDANSETRON HCL 4 MG/2ML IJ SOLN
INTRAMUSCULAR | Status: AC
  Filled 2014-07-03: qty 2

## 2014-07-03 MED ORDER — HYDROMORPHONE HCL 1 MG/ML IJ SOLN
INTRAMUSCULAR | Status: AC
  Filled 2014-07-03: qty 1

## 2014-07-03 MED ORDER — METOPROLOL TARTRATE 1 MG/ML IV SOLN
2.5000 mg | Freq: Once | INTRAVENOUS | Status: AC
  Administered 2014-07-03: 2.5 mg via INTRAVENOUS

## 2014-07-03 MED ORDER — METOPROLOL TARTRATE 1 MG/ML IV SOLN
INTRAVENOUS | Status: AC
  Filled 2014-07-03: qty 5

## 2014-07-03 MED ORDER — HYDROCODONE-ACETAMINOPHEN 5-325 MG PO TABS
1.0000 | ORAL_TABLET | Freq: Four times a day (QID) | ORAL | Status: DC | PRN
  Administered 2014-07-03 – 2014-07-04 (×2): 1 via ORAL
  Filled 2014-07-03 (×2): qty 1

## 2014-07-03 MED ORDER — ONDANSETRON HCL 4 MG/2ML IJ SOLN
4.0000 mg | Freq: Once | INTRAMUSCULAR | Status: AC
  Administered 2014-07-03: 4 mg via INTRAVENOUS

## 2014-07-03 MED ORDER — ALPRAZOLAM 0.5 MG PO TABS
0.5000 mg | ORAL_TABLET | Freq: Once | ORAL | Status: AC
  Administered 2014-07-05: 0.5 mg via ORAL
  Filled 2014-07-03: qty 1

## 2014-07-03 MED ORDER — FUROSEMIDE 10 MG/ML IJ SOLN
40.0000 mg | Freq: Once | INTRAMUSCULAR | Status: AC
  Administered 2014-07-03: 40 mg via INTRAVENOUS

## 2014-07-03 MED ORDER — AMIODARONE HCL IN DEXTROSE 360-4.14 MG/200ML-% IV SOLN
30.0000 mg/h | INTRAVENOUS | Status: DC
  Administered 2014-07-03 – 2014-07-05 (×4): 30 mg/h via INTRAVENOUS
  Filled 2014-07-03 (×10): qty 200

## 2014-07-03 MED ORDER — FERROUS SULFATE 325 (65 FE) MG PO TABS
325.0000 mg | ORAL_TABLET | Freq: Every day | ORAL | Status: DC
  Administered 2014-07-03 – 2014-07-05 (×3): 325 mg via ORAL
  Filled 2014-07-03 (×3): qty 1

## 2014-07-03 MED ORDER — ASPIRIN EC 81 MG PO TBEC
81.0000 mg | DELAYED_RELEASE_TABLET | Freq: Every day | ORAL | Status: DC
Start: 2014-07-03 — End: 2014-07-05
  Administered 2014-07-03 – 2014-07-05 (×3): 81 mg via ORAL
  Filled 2014-07-03 (×4): qty 1

## 2014-07-03 MED ORDER — ZOLPIDEM TARTRATE 5 MG PO TABS: 5.0000 mg | ORAL_TABLET | Freq: Every evening | ORAL | Status: DC | PRN

## 2014-07-03 MED ORDER — OXYCODONE HCL 5 MG PO TABS
ORAL_TABLET | ORAL | Status: AC
  Filled 2014-07-03: qty 1

## 2014-07-03 NOTE — ED Notes (Signed)
Patient reports "not feeling good" yesterday.  This morning woke with mid sternal chest pain.  Patient reports pain radiates up into right jaw and up the back of her neck.

## 2014-07-03 NOTE — ED Provider Notes (Signed)
[facility] Emergency Department Provider Note   ____________________________________________  Time seen: 7 AM I have reviewed the triage vital signs and the triage nursing note.  HISTORY  Chief Complaint Tachycardia   Historian Patient and spouse  HPI MERSADEZ LINDEN is a 59 y.o. female who woke up this morning around 5 AM feeling a sensation of dizziness and trouble catching her breath and then developed moderate jaw pain which went to her left shoulder. She's never had an MI and had a negative stress test recently. She had a negative catheterization within the last few years. Patient tells me she's had an irregular heartbeat, but can't tell me what it was. She's not on a blood thinner and she is not on a heart rate controlling medication. Patient states that she's had bradycardia. Currently she is not having any jaw pain or chest pain, just draw right-sided numbness of the face. She is currently having a headache.    Past Medical History  Diagnosis Date  . Mitral valve anterior leaflet prolapse   . Hypertension   . Bradycardia     There are no active problems to display for this patient.   Past Surgical History  Procedure Laterality Date  . Appendectomy    . Back surgery    . Abdominal hysterectomy    . Cholecystectomy      Current Outpatient Rx  Name  Route  Sig  Dispense  Refill  . FLUoxetine (PROZAC) 10 MG capsule   Oral   Take by mouth daily.         Marland Kitchen omeprazole (PRILOSEC) 40 MG capsule   Oral   Take 40 mg by mouth daily.           Allergies Ace inhibitors  No family history on file.  Social History History  Substance Use Topics  . Smoking status: Former Smoker    Quit date: 11/01/1996  . Smokeless tobacco: Not on file  . Alcohol Use: 0.6 oz/week    1 Glasses of wine per week    Review of Systems  Constitutional: Negative for fever. Eyes: Negative for visual changes. ENT: Negative for sore  throat. Cardiovascular: Positive for palpitations today Respiratory: Positive for feeling that she can't catch her breath Gastrointestinal: Negative for abdominal pain, vomiting and diarrhea. Genitourinary: Negative for dysuria. Musculoskeletal: Negative for back pain. Skin: Negative for rash. Neurological: Negative for headaches, focal weakness or numbness.  ____________________________________________   PHYSICAL EXAM:  VITAL SIGNS: ED Triage Vitals  Enc Vitals Group     BP 07/03/14 0637 181/106 mmHg     Pulse Rate 07/03/14 0629 132     Resp 07/03/14 0629 25     Temp --      Temp src --      SpO2 --      Weight 07/03/14 0629 240 lb (108.863 kg)     Height 07/03/14 0629 5' 10.5" (1.791 m)     Head Cir --      Peak Flow --      Pain Score 07/03/14 0629 7     Pain Loc --      Pain Edu? --      Excl. in [place]? --      Constitutional: Alert and oriented. Well appearing and in no distress. Appears slightly anxious Eyes: Conjunctivae are normal. PERRL. Normal extraocular movements. ENT   Head: Normocephalic and atraumatic.   Nose: No congestion/rhinnorhea.   Mouth/Throat: Mucous membranes are moist.   Neck: No  stridor. Cardiovascular: Tachycardic and irregularly irregular. No murmurs, rubs, or gallops. Respiratory: Normal respiratory effort without tachypnea nor retractions. Breath sounds are clear and equal bilaterally. No wheezes/rales/rhonchi. Gastrointestinal: Soft. No distention, no guarding, no rebound. Nontender and obese  Genitourinary/rectal: Deferred Musculoskeletal: Nontender with normal range of motion in all extremities. No joint effusions.  No lower extremity tenderness. 1+ lower extremity edema bilaterally Neurologic:  Normal speech and language. Right-sided facial droop, paresthesias of the right face, right arm, and right leg. Bilateral lower extremities are 4-5 strength.  Right upper extremity grip strength 4-5, left upper extremity grip strength 5  out of 5  Skin:  Skin is warm, dry and intact. No rash noted. Psychiatric: Mood and affect are normal. Speech and behavior are normal. Patient exhibits appropriate insight and judgment.  ____________________________________________   EKG  I, Lisa Roca, MD, the attending physician have personally viewed and interpreted this ECG.  154 bpm. Undetermined rhythm that appears to be A. Fib with rapid ventricular response. Due to irregularly irregular. Left axis axis. Narrow QRS. Nonspecific ST and T-wave.  I, Lisa Roca, MD, the attending physician have personally viewed and interpreted this ECG.   126 bpm. A. fib with rapid ventricular response. Left axis deviation. Narrow QRS. Nonspecific ST and T-wave.  ____________________________________________  LABS (pertinent positives/negatives)  White blood count 8.5, hemoglobin 14.1 Troponin less than 0.03 Electrolytes within normal limits BNP 114 ____________________________________________  RADIOLOGY Radiologist results reviewed  Chest x-ray: Lung cardiomegaly with no acute pulmonary process. __________________________________________  PROCEDURES  Procedure(s) performed: None Critical Care performed: None  ____________________________________________   ED COURSE / ASSESSMENT AND PLAN  Pertinent labs & imaging results that were available during my care of the patient were reviewed by me and considered in my medical decision making (see chart for details).  Patient's initial complaints of chest and jaw discomfort with dizziness and heart racing/palpitations seem all symptomatic due to a new onset A. fib with rapid ventricular response. The chest discomfort has eased off, but she still complaining of right jaw numbness. When I look at her she does have a mild right-sided facial droop which the husband does think is probably new. On physical exam she also has paresthesias of the right face arm and leg raising possibly of stroke.  Time of onset is unknown given that she woke up at 5 AM with these symptoms. She is not a TPA candidate due to time of onset being unknown.  Clinically it looks like she has evidence of congestive heart failure with lower extremity edema and the new onset A. fib with RVR. Patient was given Lasix prior to BNP returning.  After head CT confirmed no hemorrhage, the additional 2 baby aspirins were given to supplement the 2 baby aspirin she took at home.  After the dose of metoprolol, her heart rate was anywhere between 106 and 120 which is some improvement.  Hospitalist was consultation for admission.   ___________________________________________   FINAL CLINICAL IMPRESSION(S) / ED DIAGNOSES   Final diagnoses:  Atrial fibrillation with rapid ventricular response  New onset atrial fibrillation  Ischemic stroke      Lisa Roca, MD 07/03/14 [phone]

## 2014-07-03 NOTE — H&P (Signed)
[place] [facility] [place] NAME: Renee [name]    MR#:  [id number]  DATE OF BIRTH:  1956-01-18  DATE OF ADMISSION:  07/03/2014  PRIMARY CARE PHYSICIAN: Dr. Ola Spurr   Primary cardiologist: Dr. [name]  REQUESTING/REFERRING PHYSICIAN: Dr. Reita Cliche  CHIEF COMPLAINT:   Chief Complaint  Patient presents with  . Tachycardia    HISTORY OF PRESENT ILLNESS: Renee [name]  is a 59 y.o. female with a known history of hypertension hyperlipidemia, anxiety, mitral valve prolapse. She was admitted 1 month ago in hospital with complaining of chest pain and dizziness but after negative troponins results was discharged with cardiology fellow IN  the office. She was seen by Dr. [name] but no further workup was ordered.  Since yesterday afternoon she feels more dizzy and weak last night she did not had good sleep because of feeling of palpitation. Her husband was on work as a Administrator in the night, and she called him multiple times because of not feeling well. Around 5 in the morning she woke up with palpitation and heaviness in her chest, also had right arm pain and she felt heaviness on her jaw on the right side. So came to emergency room.  In emergency room she was noted to have atrial fibrillation with heart rate up to 140, right-sided facial and limbs weakness, given injection metoprolol 1 time in ER, checked a CT head which is negative. Hospitalist service was called in to admit her for possible stroke and A. fib.  PAST MEDICAL HISTORY:   Past Medical History  Diagnosis Date  . Mitral valve anterior leaflet prolapse   . Hypertension   . Bradycardia   . Hyperlipidemia     PAST SURGICAL HISTORY:  Past Surgical History  Procedure Laterality Date  . Appendectomy    . Back surgery    . Abdominal hysterectomy    . Cholecystectomy      SOCIAL HISTORY:  History  Substance Use Topics  . Smoking status: Former Smoker    Quit date: 11/01/1996   . Smokeless tobacco: Not on file  . Alcohol Use: 0.6 oz/week    1 Glasses of wine per week    FAMILY HISTORY:  Family History  Problem Relation Age of Onset  . Coronary artery disease Sister  66 years    DRUG ALLERGIES:  Allergies  Allergen Reactions  . 2,4-D Dimethylamine (Amisol) Nausea Only  . Ace Inhibitors Other (See Comments)    Cramps and cough  . Codeine Nausea Only    Other reaction(s): Hallucination  . Nsaids Other (See Comments)    dyspepsia  . Telmisartan     Other reaction(s): Other (See Comments)  . Prednisone Palpitations    tachycardia    REVIEW OF SYSTEMS:   CONSTITUTIONAL: No fever, fatigue or weakness. Positive for headache. EYES: No blurred or double vision.  EARS, NOSE, AND THROAT: No tinnitus or ear pain.  RESPIRATORY: No cough, shortness of breath, wheezing or hemoptysis.  CARDIOVASCULAR: No chest pain, orthopnea, edema. Positive for palpitation. GASTROINTESTINAL: No nausea, vomiting, diarrhea or abdominal pain.  GENITOURINARY: No dysuria, hematuria.  ENDOCRINE: No polyuria, nocturia,  HEMATOLOGY: No anemia, easy bruising or bleeding SKIN: No rash or lesion. MUSCULOSKELETAL: No joint pain or arthritis.   NEUROLOGIC: No tingling, numbness, weakness.  PSYCHIATRY: No anxiety or depression.   MEDICATIONS AT HOME:  Prior to Admission medications   Medication Sig Start Date End Date Taking? Authorizing Provider  ALPRAZolam Duanne Moron) 0.25 MG  tablet Take 0.25 mg by mouth at bedtime as needed. 05/05/14  Yes Historical Provider, MD  aspirin EC 81 MG tablet Take 81 mg by mouth daily.   Yes Historical Provider, MD  Cholecalciferol (VITAMIN D3) 1000 UNITS CAPS Take 1,000 Units by mouth daily.   Yes Historical Provider, MD  ferrous sulfate 325 (65 FE) MG tablet Take 325 mg by mouth daily.   Yes Historical Provider, MD  FLUoxetine (PROZAC) 20 MG tablet Take 40 mg by mouth daily. 03/01/14  Yes Historical Provider, MD  HYDROcodone-acetaminophen (NORCO/VICODIN)  5-325 MG per tablet Take 1 tablet by mouth every 6 (six) hours as needed. 12/23/13  Yes Historical Provider, MD  losartan (COZAAR) 50 MG tablet Take 50 mg by mouth daily. 06/04/14  Yes Historical Provider, MD  Multiple Vitamin (MULTI-VITAMINS) TABS Take 1 tablet by mouth daily.   Yes Historical Provider, MD  omeprazole (PRILOSEC) 40 MG capsule Take 40 mg by mouth daily. 08/17/13  Yes Historical Provider, MD  ondansetron (ZOFRAN) 4 MG tablet Take 4 mg by mouth every 8 (eight) hours as needed. 06/03/14  Yes Historical Provider, MD  PREMARIN 0.625 MG tablet Take 0.625 mg by mouth daily. 06/03/14  Yes Historical Provider, MD  zolpidem (AMBIEN) 5 MG tablet Take 5 mg by mouth daily as needed. 03/01/14  Yes Historical Provider, MD      PHYSICAL EXAMINATION:   VITAL SIGNS: Blood pressure 146/31, pulse 144, temperature 97.7 F (36.5 C), resp. rate 20, height 5' 10.5" (1.791 m), weight 108.863 kg (240 lb), SpO2 97 %.  GENERAL:  59 y.o.-year-old patient lying in the bed with no acute distress.  EYES: Pupils equal, round, reactive to light and accommodation. No scleral icterus. Extraocular muscles intact.  HEENT: Head atraumatic, normocephalic. Oropharynx and nasopharynx clear.  NECK:  Supple, no jugular venous distention. No thyroid enlargement, no tenderness.  LUNGS: Normal breath sounds bilaterally, no wheezing, rales,rhonchi or crepitation. No use of accessory muscles of respiration.  CARDIOVASCULAR: S1, S2 normal.Irregular rhythm. No murmurs, rubs, or gallops.  ABDOMEN: Soft, nontender, nondistended. Bowel sounds present. No organomegaly or mass.  EXTREMITIES: No pedal edema, cyanosis, small echymosis on right lower leg and around ankle.  NEUROLOGIC: Right side Eyelid drooping, facial weakness on right side, right Upper and lower limbs power 4/5. PSYCHIATRIC: The patient is alert and oriented x 3.  SKIN: No obvious rash, lesion, or ulcer.   LABORATORY PANEL:   CBC  Recent Labs Lab 07/03/14 0635   WBC 8.5  HGB 14.1  HCT 42.8  PLT 266  MCV 85.6  MCH 28.1  MCHC 32.9  RDW 14.0   ------------------------------------------------------------------------------------------------------------------  Chemistries   Recent Labs Lab 07/03/14 0635  NA 140  K 3.8  CL 107  CO2 23  GLUCOSE 112*  BUN 16  CREATININE 0.69  CALCIUM 9.3  AST 27  ALT 25  ALKPHOS 72  BILITOT 0.4   ------------------------------------------------------------------------------------------------------------------ estimated creatinine clearance is 104.6 mL/min (by C-G formula based on Cr of 0.69).  Cardiac Enzymes  Recent Labs Lab 07/03/14 0635  TROPONINI <0.03    Urinalysis No results found for: COLORURINE, APPEARANCEUR, LABSPEC, PHURINE, GLUCOSEU, HGBUR, BILIRUBINUR, KETONESUR, PROTEINUR, UROBILINOGEN, NITRITE, LEUKOCYTESUR   RADIOLOGY: Ct Head Wo Contrast  07/03/2014   CLINICAL DATA:  Right-sided facial droop. Right face, arm, and leg tingling.  EXAM: CT HEAD WITHOUT CONTRAST  TECHNIQUE: Contiguous axial images were obtained from the base of the skull through the vertex without intravenous contrast.  COMPARISON:  10/24/2006  FINDINGS: The ventricles  and sulci are within normal limits for age. There is no evidence of acute infarct, intracranial hemorrhage, mass, midline shift, or extra-axial collection.  The orbits are unremarkable. The visualized paranasal sinuses and mastoid air cells are clear. No skull fracture is identified.  IMPRESSION: Unremarkable head CT.  These results were called by telephone at the time of interpretation on 07/03/2014 at 7:56 am to Dr. Lisa Roca , who verbally acknowledged these results.   Electronically Signed   By: Logan Bores   On: 07/03/2014 07:56   Dg Chest Port 1 View  07/03/2014   CLINICAL DATA:  Tachycardia.  History of hypertension, bradycardia.  EXAM: PORTABLE CHEST - 1 VIEW  COMPARISON:  Chest radiograph May 06, 2014  FINDINGS: The cardiac silhouette is  upper limits of normal in size, mediastinal silhouette is unremarkable. The lungs are clear without pleural effusions or focal consolidations. Trachea projects midline and there is no pneumothorax. Soft tissue planes and included osseous structures are non-suspicious.  IMPRESSION: Borderline cardiomegaly, no acute pulmonary process.   Electronically Signed   By: Elon Alas M.D.   On: 07/03/2014 06:54    EKG: Shows Atrial fibrillation with irregular response, and rate around 120-130/ min.  IMPRESSION AND PLAN:  * Atrial fibrillation with rapid ventricular response Already given metoprolol injection by ER, still heart rate is 120 to 130. Will start on amiodarone IV drip and place and stepdown unit. Consult cardiology by Dr. [name], monitor on telemetry, follow serial troponin, echocardiogram. Due to suspicious stroke, I will not start on anticoagulation at this time.  * Suspected stroke CT head negative, check MRI brain, echocardiogram, carotid Doppler studies. She already takes aspirin at home, I will start Plavix. Check lipid panel, start on statins. Continue neuro checks, get physical therapy evaluation.  * Hypertension Currently we will allow her blood pressure to be maintained around 150-160. We may resume hypertensive medication tomorrow.  * Anxiety Continue Xanax and fluoxetine.   All the records are reviewed and case discussed with ED provider. Management plans discussed with the patient, family and they are in agreement.  CODE STATUS: Full  TOTAL TIME TAKING CARE OF THIS PATIENT:critical care 60 minutes.    Vaughan Basta M.D on 07/03/2014   Between 7am to 6pm - Pager - [phone]  After 6pm go to [email or web address] - password [email or web address] [facility]  [facility]  Office  [phone]  CC: Primary care physician; No primary care provider on file.

## 2014-07-03 NOTE — ED Notes (Signed)
Patient with complaint of elevated blood pressure, feeling like her heart is racing and right side jaw pain.

## 2014-07-04 DIAGNOSIS — R42 Dizziness and giddiness: Secondary | ICD-10-CM

## 2014-07-04 DIAGNOSIS — I481 Persistent atrial fibrillation: Secondary | ICD-10-CM

## 2014-07-04 LAB — BASIC METABOLIC PANEL
Anion gap: 7 (ref 5–15)
BUN: 15 mg/dL (ref 6–20)
CALCIUM: 8.8 mg/dL — AB (ref 8.9–10.3)
CO2: 28 mmol/L (ref 22–32)
Chloride: 101 mmol/L (ref 101–111)
Creatinine, Ser: 0.92 mg/dL (ref 0.44–1.00)
GFR calc Af Amer: >60 mL/min (ref >60)
GFR calc non Af Amer: >60 mL/min (ref >60)
Glucose, Bld: 118 mg/dL — ABNORMAL HIGH (ref 65–99)
Potassium: 3.9 mmol/L (ref 3.5–5.1)
Sodium: 136 mmol/L (ref 135–145)

## 2014-07-04 LAB — CBC
HEMATOCRIT: 41.8 % (ref 35.0–47.0)
HEMOGLOBIN: 14 g/dL (ref 12.0–16.0)
MCH: 28.8 pg (ref 26.0–34.0)
MCHC: 33.4 g/dL (ref 32.0–36.0)
MCV: 86.3 fL (ref 80.0–100.0)
Platelets: 242 10*3/uL (ref 150–440)
RBC: 4.85 MIL/uL (ref 3.80–5.20)
RDW: 14.4 % (ref 11.5–14.5)
WBC: 7.2 10*3/uL (ref 3.6–11.0)

## 2014-07-04 MED ORDER — AMIODARONE HCL 200 MG PO TABS
400.0000 mg | ORAL_TABLET | Freq: Every day | ORAL | Status: DC
  Administered 2014-07-04 – 2014-07-05 (×2): 400 mg via ORAL
  Filled 2014-07-04 (×2): qty 2

## 2014-07-04 MED ORDER — HYDROCODONE-ACETAMINOPHEN 5-325 MG PO TABS
1.0000 | ORAL_TABLET | Freq: Four times a day (QID) | ORAL | Status: DC | PRN
Start: 2014-07-04 — End: 2014-07-05

## 2014-07-04 MED ORDER — ONDANSETRON HCL 4 MG/2ML IJ SOLN
4.0000 mg | Freq: Four times a day (QID) | INTRAMUSCULAR | Status: DC | PRN
  Filled 2014-07-04: qty 2

## 2014-07-04 MED ORDER — ONDANSETRON HCL 4 MG/2ML IJ SOLN
4.0000 mg | Freq: Four times a day (QID) | INTRAMUSCULAR | Status: DC | PRN
  Administered 2014-07-04 – 2014-07-05 (×2): 4 mg via INTRAVENOUS

## 2014-07-04 MED ORDER — ONDANSETRON HCL 4 MG/2ML IJ SOLN
INTRAMUSCULAR | Status: AC
  Administered 2014-07-04: 10:00:00
  Filled 2014-07-04: qty 2

## 2014-07-04 NOTE — Evaluation (Signed)
Physical Therapy Evaluation Patient Details Name: Renee [name] MRN: [id number] DOB: July 31, 1955 Today's Date: 07/04/2014   History of Present Illness   (elevated HR and dizziness, chest pain - (-) CT for CVA)  Clinical Impression  Pt has some veering L with ambulation, she reports some head ache/dizziness t/o session, but is able to ambulate >100 ft reporting that she feels close to her baseline.  She is not interested in home or outpatient PT, and though she is functional would benefit from continued PT here in the hospital secondary to having some minimal weakness/concerns.    Follow Up Recommendations No PT follow up    Equipment Recommendations       Recommendations for Other Services       Precautions / Restrictions Precautions Precautions: Fall Restrictions Weight Bearing Restrictions: No      Mobility  Bed Mobility Overal bed mobility: Independent                Transfers Overall transfer level: Independent                  Ambulation/Gait Ambulation/Gait assistance: Min guard Ambulation Distance (Feet): 150 Feet Assistive device: None       General Gait Details: PT with occasional veering to the L, she reports to be near her baseline  Stairs Stairs: Yes Stairs assistance: Min guard Stair Management: One rail Left Number of Stairs: 6 General stair comments: Pt does well on steps, is able to negotiate 2 w/o UEs, reports feeling near her baseline  Wheelchair Mobility    Modified Rankin (Stroke Patients Only)       Balance                                             Pertinent Vitals/Pain Pain Assessment: No/denies pain    Home Living Family/patient expects to be discharged to:: Private residence Living Arrangements: Spouse/significant other Available Help at Discharge: Family Type of Home: House           Additional Comments: 6 steps with rails    Prior Function Level of Independence: Independent                Hand Dominance        Extremity/Trunk Assessment   Upper Extremity Assessment: Generalized weakness           Lower Extremity Assessment: Generalized weakness (L side feels minimally weaker, but is [facility])         Communication   Communication: No difficulties  Cognition Arousal/Alertness: Awake/alert Behavior During Therapy: WFL for tasks assessed/performed Overall Cognitive Status: Within Functional Limits for tasks assessed                      General Comments      Exercises        Assessment/Plan    PT Assessment Patient needs continued PT services  PT Diagnosis Generalized weakness;Difficulty walking   PT Problem List Decreased strength;Decreased balance;Decreased safety awareness;Decreased mobility  PT Treatment Interventions Gait training;Neuromuscular re-education;Therapeutic exercise;Therapeutic activities   PT Goals (Current goals can be found in the Care Plan section) Acute Rehab PT Goals Patient Stated Goal: "I just want to go home PT Goal Formulation: With patient/family Time For Goal Achievement: 07/18/14 Potential to Achieve Goals: Good    Frequency Min 2X/week   Barriers to discharge  Co-evaluation               End of Session Equipment Utilized During Treatment: Gait belt               Time: 8485-9276 PT Time Calculation (min) (ACUTE ONLY): 21 min   Charges:   PT Evaluation $Initial PT Evaluation Tier I: 1 Procedure     PT G Codes:       Wayne Both, PT, DPT [phone]  Kreg Shropshire 07/04/2014, 2:55 PM

## 2014-07-04 NOTE — Progress Notes (Signed)
[place] [facility] [place] NAME: Renee [name]    MR#:  [id number]  DATE OF BIRTH:  Nov 18, 1955  SUBJECTIVE:  CHIEF COMPLAINT:   Chief Complaint  Patient presents with  . Tachycardia   Feels much better today.  REVIEW OF SYSTEMS:  CONSTITUTIONAL: No fever, fatigue or weakness.  EYES: No blurred or double vision.  EARS, NOSE, AND THROAT: No tinnitus or ear pain.  RESPIRATORY: No cough, shortness of breath, wheezing or hemoptysis.  CARDIOVASCULAR: No chest pain, orthopnea, edema.  GASTROINTESTINAL: No nausea, vomiting, diarrhea or abdominal pain.  GENITOURINARY: No dysuria, hematuria.  ENDOCRINE: No polyuria, nocturia,  HEMATOLOGY: No anemia, easy bruising or bleeding SKIN: No rash or lesion. MUSCULOSKELETAL: No joint pain or arthritis.   NEUROLOGIC: No tingling, numbness, slight weakness on right side.  PSYCHIATRY: No anxiety or depression.   ROS  DRUG ALLERGIES:   Allergies  Allergen Reactions  . 2,4-D Dimethylamine (Amisol) Nausea Only  . Ace Inhibitors Other (See Comments)    Cramps and cough  . Codeine Nausea Only    Other reaction(s): Hallucination  . Nsaids Other (See Comments)    dyspepsia  . Telmisartan     Other reaction(s): Other (See Comments)  . Prednisone Palpitations    tachycardia    VITALS:  Blood pressure 130/66, pulse 58, temperature 97.6 F (36.4 C), temperature source Oral, resp. rate 17, height 5\' 10"  (1.778 m), weight 110.678 kg (244 lb), SpO2 97 %.  PHYSICAL EXAMINATION:  GENERAL:  59 y.o.-year-old patient lying in the bed with no acute distress.  EYES: Pupils equal, round, reactive to light and accommodation. No scleral icterus. Extraocular muscles intact.  HEENT: Head atraumatic, normocephalic. Oropharynx and nasopharynx clear.  NECK:  Supple, no jugular venous distention. No thyroid enlargement, no tenderness.  LUNGS: Normal breath sounds bilaterally, no wheezing, rales,rhonchi or  crepitation. No use of accessory muscles of respiration.  CARDIOVASCULAR: S1, S2 normal. No murmurs, rubs, or gallops.  ABDOMEN: Soft, nontender, nondistended. Bowel sounds present. No organomegaly or mass.  EXTREMITIES: No pedal edema, cyanosis, or clubbing.  NEUROLOGIC: right side face weakness. Muscle strength 4/5 in right side extremities. Sensation intact. Gait not checked.  PSYCHIATRIC: The patient is alert and oriented x 3.  SKIN: No obvious rash, lesion, or ulcer.   Physical Exam LABORATORY PANEL:   CBC  Recent Labs Lab 07/04/14 0433  WBC 7.2  HGB 14.0  HCT 41.8  PLT 242   ------------------------------------------------------------------------------------------------------------------  Chemistries   Recent Labs Lab 07/03/14 0635  07/04/14 0433  NA 140  --  136  K 3.8  --  3.9  CL 107  --  101  CO2 23  --  28  GLUCOSE 112*  --  118*  BUN 16  --  15  CREATININE 0.69  < > 0.92  CALCIUM 9.3  --  8.8*  AST 27  --   --   ALT 25  --   --   ALKPHOS 72  --   --   BILITOT 0.4  --   --   < > = values in this interval not displayed. ------------------------------------------------------------------------------------------------------------------  Cardiac Enzymes  Recent Labs Lab 07/03/14 1301 07/03/14 1941  TROPONINI 0.04* 0.03   ------------------------------------------------------------------------------------------------------------------  RADIOLOGY:  Ct Head Wo Contrast  07/03/2014   CLINICAL DATA:  Right-sided facial droop. Right face, arm, and leg tingling.  EXAM: CT HEAD WITHOUT CONTRAST  TECHNIQUE: Contiguous axial images were obtained from the base of the  skull through the vertex without intravenous contrast.  COMPARISON:  10/24/2006  FINDINGS: The ventricles and sulci are within normal limits for age. There is no evidence of acute infarct, intracranial hemorrhage, mass, midline shift, or extra-axial collection.  The orbits are unremarkable. The  visualized paranasal sinuses and mastoid air cells are clear. No skull fracture is identified.  IMPRESSION: Unremarkable head CT.  These results were called by telephone at the time of interpretation on 07/03/2014 at 7:56 am to Dr. Lisa Roca , who verbally acknowledged these results.   Electronically Signed   By: Logan Bores   On: 07/03/2014 07:56   US Carotid Bilateral  07/03/2014   CLINICAL DATA:  59 year old female with right-sided facial droop and right face, arm and leg tingling as well as increased heart rate and dizziness  EXAM: BILATERAL CAROTID DUPLEX ULTRASOUND  TECHNIQUE: Pearline Cables scale imaging, color Doppler and duplex ultrasound were performed of bilateral carotid and vertebral arteries in the neck.  COMPARISON:  Head CT performed earlier today  FINDINGS: Criteria: Quantification of carotid stenosis is based on velocity parameters that correlate the residual internal carotid diameter with NASCET-based stenosis levels, using the diameter of the distal internal carotid lumen as the denominator for stenosis measurement.  The following velocity measurements were obtained:  RIGHT  ICA:  67/33 cm/sec  CCA:  49/44 cm/sec  SYSTOLIC ICA/CCA RATIO:  1.1  DIASTOLIC ICA/CCA RATIO:  1.6  ECA:  89 cm/sec  LEFT  ICA:  90/38 cm/sec  CCA:  96/75 cm/sec  SYSTOLIC ICA/CCA RATIO:  1.6  DIASTOLIC ICA/CCA RATIO:  1.7  ECA:  93 cm/sec  RIGHT CAROTID ARTERY: Mild focal smooth heterogeneous atherosclerotic plaque in the proximal internal carotid artery. By peak systolic velocity criteria the estimated stenosis remains less than 50%. The heart rate is irregular.  RIGHT VERTEBRAL ARTERY: Patent with antegrade flow. The heartbeat is regular.  LEFT CAROTID ARTERY: Trace heterogeneous atherosclerotic plaque in the carotid bifurcation. Intimal medial thickening extends into the proximal internal carotid artery. No evidence of significant stenosis. The heart beat is irregular.  LEFT VERTEBRAL ARTERY:  Patent with antegrade flow.   IMPRESSION: 1. Mild (1-49%) stenosis of the proximal right internal carotid artery secondary to heterogeneous atherosclerotic plaque. 2. Mild heterogeneous plaque in the left carotid bifurcation without significant internal carotid stenosis. 3. Vertebral arteries are patent with antegrade flow bilaterally. 4. Nonspecific cardiac arrhythmia. Signed,  Criselda Peaches, MD  [facility]  [facility]   Electronically Signed   By: Jacqulynn Cadet M.D.   On: 07/03/2014 15:34   Dg Chest Port 1 View  07/03/2014   CLINICAL DATA:  Tachycardia.  History of hypertension, bradycardia.  EXAM: PORTABLE CHEST - 1 VIEW  COMPARISON:  Chest radiograph May 06, 2014  FINDINGS: The cardiac silhouette is upper limits of normal in size, mediastinal silhouette is unremarkable. The lungs are clear without pleural effusions or focal consolidations. Trachea projects midline and there is no pneumothorax. Soft tissue planes and included osseous structures are non-suspicious.  IMPRESSION: Borderline cardiomegaly, no acute pulmonary process.   Electronically Signed   By: Elon Alas M.D.   On: 07/03/2014 06:54     ASSESSMENT AND PLAN:   * Atrial fibrillation with rapid ventricular response Already given metoprolol injection by ER, still heart rate was 120 to 130. started on amiodarone IV drip and place and stepdown unit. Converted to NSR- switched to oral. Consult cardiology by Dr. [name], monitor on telemetry, followed serial troponin, echocardiogram. Due  to suspicious stroke, I will not start on anticoagulation at this time.  * Suspected stroke CT head negative, check MRI brain, echocardiogram, negative carotid Doppler studies. She already takes aspirin at home, start Plavix. Checked lipid panel- LDL high , started on statins. Continue neuro checks, get physical therapy evaluation and neurology consult.  * Hypertension Currently we will allow her blood pressure to  be maintained around 150-160. BP under control.  * Anxiety Continue Xanax and fluoxetine.  All the records are reviewed and case discussed with Care Management/Social Workerr. Management plans discussed with the patient, family and they are in agreement.  CODE STATUS: full  TOTAL TIME TAKING CARE OF THIS PATIENT: 35 minutes.   POSSIBLE D/C IN 1-2 DAYS, DEPENDING ON CLINICAL CONDITION.   Vaughan Basta M.D on 07/04/2014   Between 7am to 6pm - Pager - [phone]  After 6pm go to [email or web address] - password [email or web address] [facility]  [place] [facility]  Office  [phone]  CC: Primary care physician; No primary care provider on file.

## 2014-07-04 NOTE — Consult Note (Signed)
CC: dizziness   HPI: Renee [name] is an 59 y.o. female  known history of hypertension hyperlipidemia anxiety mitral valve prolapse admitted about 2 months ago complained of chest pain. Patient comes in with generalized weakness, dizziness and A-fib with RVR. For A-fib pt was managed on ASA only.   When questioned about dizziness pt does state she has been having non positional dizziness for the past 6-8 months worse last night.    Past Medical History  Diagnosis Date  . Mitral valve anterior leaflet prolapse   . Hypertension   . Bradycardia   . Hyperlipidemia     Past Surgical History  Procedure Laterality Date  . Appendectomy    . Back surgery    . Abdominal hysterectomy    . Cholecystectomy      Family History  Problem Relation Age of Onset  . Coronary artery disease Sister     Social History:  reports that she quit smoking about 17 years ago. She does not have any smokeless tobacco history on file. She reports that she drinks about 0.6 oz of alcohol per week. She reports that she does not use illicit drugs.  Allergies  Allergen Reactions  . 2,4-D Dimethylamine (Amisol) Nausea Only  . Ace Inhibitors Other (See Comments)    Cramps and cough  . Codeine Nausea Only    Other reaction(s): Hallucination  . Nsaids Other (See Comments)    dyspepsia  . Telmisartan     Other reaction(s): Other (See Comments)  . Prednisone Palpitations    tachycardia    Medications: I have reviewed the patient's current medications.  ROS: History obtained from the patient  General ROS: negative for - chills, fatigue, fever, night sweats, weight gain or weight loss Psychological ROS: negative for - behavioral disorder, hallucinations, memory difficulties, mood swings or suicidal ideation Ophthalmic ROS: negative for - blurry vision, double vision, eye pain or loss of vision ENT ROS: negative for - epistaxis, nasal discharge, oral lesions, sore throat, tinnitus or vertigo Allergy and  Immunology ROS: negative for - hives or itchy/watery eyes Hematological and Lymphatic ROS: negative for - bleeding problems, bruising or swollen lymph nodes Endocrine ROS: negative for - galactorrhea, hair pattern changes, polydipsia/polyuria or temperature intolerance Respiratory ROS: negative for - cough, hemoptysis, shortness of breath or wheezing Cardiovascular ROS: negative for - chest pain, dyspnea on exertion, edema or irregular heartbeat Gastrointestinal ROS: negative for - abdominal pain, diarrhea, hematemesis, nausea/vomiting or stool incontinence Genito-Urinary ROS: negative for - dysuria, hematuria, incontinence or urinary frequency/urgency Musculoskeletal ROS: negative for - joint swelling or muscular weakness Neurological ROS: as noted in HPI Dermatological ROS: negative for rash and skin lesion changes  Physical Examination: Blood pressure 130/66, pulse 56, temperature 98.2 F (36.8 C), temperature source Oral, resp. rate 18, height 5\' 10"  (1.778 m), weight 110.678 kg (244 lb), SpO2 98 %.  History obtained from the patient  HEENT-  Normocephalic, no lesions, without obvious abnormality.  Normal external eye and conjunctiva.  Normal TM's bilaterally.  Normal auditory canals and external ears. Normal external nose, mucus membranes and septum.  Normal pharynx. Cardiovascular- irregularly irregular rhythm, pulses palpable throughout   Lungs- Heart exam - S1, S2 normal, no murmur, no gallop, rate regular Abdomen- soft, non-tender; bowel sounds normal; no masses,  no organomegaly Extremities- less then 2 second capillary refill Lymph-no adenopathy palpable Musculoskeletal-no joint tenderness, deformity or swelling Skin-warm and dry, no hyperpigmentation, vitiligo, or suspicious lesions  Neurological Examination Mental Status: Alert, oriented, thought  content appropriate.  Speech fluent without evidence of aphasia.  Able to follow 3 step commands without difficulty. Cranial  Nerves: II: Discs flat bilaterally; Visual fields grossly normal, pupils equal, round, reactive to light and accommodation III,IV, VI: ptosis not present, extra-ocular motions intact bilaterally V,VII: smile symmetric, facial light touch sensation normal bilaterally VIII: hearing normal bilaterally IX,X: gag reflex present XI: bilateral shoulder shrug XII: midline tongue extension Motor: Right : Upper extremity   4+/5    Left:     Upper extremity   4+/5  Lower extremity   5/5     Lower extremity   5/5 Tone and bulk:normal tone throughout; no atrophy noted Sensory: Pinprick and light touch intact throughout, bilaterally Deep Tendon Reflexes: 2+ and symmetric throughout Plantars: Right: downgoing   Left: downgoing Cerebellar: normal finger-to-nose, normal rapid alternating movements and normal heel-to-shin test Gait: normal gait and station     Laboratory Studies:   Basic Metabolic Panel:  Recent Labs Lab 07/03/14 0635 07/03/14 1301 07/04/14 0433  NA 140  --  136  K 3.8  --  3.9  CL 107  --  101  CO2 23  --  28  GLUCOSE 112*  --  118*  BUN 16  --  15  CREATININE 0.69 0.68 0.92  CALCIUM 9.3  --  8.8*    Liver Function Tests:  Recent Labs Lab 07/03/14 0635  AST 27  ALT 25  ALKPHOS 72  BILITOT 0.4  PROT 7.9  ALBUMIN 4.0   No results for input(s): LIPASE, AMYLASE in the last 168 hours. No results for input(s): AMMONIA in the last 168 hours.  CBC:  Recent Labs Lab 07/03/14 0635 07/03/14 1301 07/04/14 0433  WBC 8.5 8.0 7.2  HGB 14.1 14.0 14.0  HCT 42.8 42.8 41.8  MCV 85.6 85.6 86.3  PLT 266 262 242    Cardiac Enzymes:  Recent Labs Lab 07/03/14 0635 07/03/14 1041 07/03/14 1301 07/03/14 1941  TROPONINI <0.03 0.03 0.04* 0.03    BNP: Invalid input(s): POCBNP  CBG:  Recent Labs Lab 07/03/14 1154  GLUCAP 85    Microbiology: Results for orders placed or performed during the hospital encounter of 07/03/14  MRSA PCR Screening     Status:  None   Collection Time: 07/03/14  1:02 PM  Result Value Ref Range Status   MRSA by PCR NEGATIVE NEGATIVE Final    Comment:        The GeneXpert MRSA Assay (FDA approved for NASAL specimens only), is one component of a comprehensive MRSA colonization surveillance program. It is not intended to diagnose MRSA infection nor to guide or monitor treatment for MRSA infections.     Coagulation Studies: No results for input(s): LABPROT, INR in the last 72 hours.  Urinalysis: No results for input(s): COLORURINE, LABSPEC, PHURINE, GLUCOSEU, HGBUR, BILIRUBINUR, KETONESUR, PROTEINUR, UROBILINOGEN, NITRITE, LEUKOCYTESUR in the last 168 hours.  Invalid input(s): APPERANCEUR  Lipid Panel:     Component Value Date/Time   CHOL 251* 07/03/2014 1041   TRIG 103 07/03/2014 1041   HDL 74 07/03/2014 1041   CHOLHDL 3.4 07/03/2014 1041   VLDL 21 07/03/2014 1041   LDLCALC 156* 07/03/2014 1041    HgbA1C: No results found for: HGBA1C  Urine Drug Screen:  No results found for: LABOPIA, COCAINSCRNUR, LABBENZ, AMPHETMU, THCU, LABBARB  Alcohol Level: No results for input(s): ETH in the last 168 hours.  Other results: EKG: atrial fibrillation, rate 130.  Imaging: Ct Head Wo Contrast  07/03/2014  CLINICAL DATA:  Right-sided facial droop. Right face, arm, and leg tingling.  EXAM: CT HEAD WITHOUT CONTRAST  TECHNIQUE: Contiguous axial images were obtained from the base of the skull through the vertex without intravenous contrast.  COMPARISON:  10/24/2006  FINDINGS: The ventricles and sulci are within normal limits for age. There is no evidence of acute infarct, intracranial hemorrhage, mass, midline shift, or extra-axial collection.  The orbits are unremarkable. The visualized paranasal sinuses and mastoid air cells are clear. No skull fracture is identified.  IMPRESSION: Unremarkable head CT.  These results were called by telephone at the time of interpretation on 07/03/2014 at 7:56 am to Dr. Lisa Roca ,  who verbally acknowledged these results.   Electronically Signed   By: Logan Bores   On: 07/03/2014 07:56   US Carotid Bilateral  07/03/2014   CLINICAL DATA:  59 year old female with right-sided facial droop and right face, arm and leg tingling as well as increased heart rate and dizziness  EXAM: BILATERAL CAROTID DUPLEX ULTRASOUND  TECHNIQUE: Pearline Cables scale imaging, color Doppler and duplex ultrasound were performed of bilateral carotid and vertebral arteries in the neck.  COMPARISON:  Head CT performed earlier today  FINDINGS: Criteria: Quantification of carotid stenosis is based on velocity parameters that correlate the residual internal carotid diameter with NASCET-based stenosis levels, using the diameter of the distal internal carotid lumen as the denominator for stenosis measurement.  The following velocity measurements were obtained:  RIGHT  ICA:  67/33 cm/sec  CCA:  25/85 cm/sec  SYSTOLIC ICA/CCA RATIO:  1.1  DIASTOLIC ICA/CCA RATIO:  1.6  ECA:  89 cm/sec  LEFT  ICA:  90/38 cm/sec  CCA:  27/78 cm/sec  SYSTOLIC ICA/CCA RATIO:  1.6  DIASTOLIC ICA/CCA RATIO:  1.7  ECA:  93 cm/sec  RIGHT CAROTID ARTERY: Mild focal smooth heterogeneous atherosclerotic plaque in the proximal internal carotid artery. By peak systolic velocity criteria the estimated stenosis remains less than 50%. The heart rate is irregular.  RIGHT VERTEBRAL ARTERY: Patent with antegrade flow. The heartbeat is regular.  LEFT CAROTID ARTERY: Trace heterogeneous atherosclerotic plaque in the carotid bifurcation. Intimal medial thickening extends into the proximal internal carotid artery. No evidence of significant stenosis. The heart beat is irregular.  LEFT VERTEBRAL ARTERY:  Patent with antegrade flow.  IMPRESSION: 1. Mild (1-49%) stenosis of the proximal right internal carotid artery secondary to heterogeneous atherosclerotic plaque. 2. Mild heterogeneous plaque in the left carotid bifurcation without significant internal carotid stenosis. 3.  Vertebral arteries are patent with antegrade flow bilaterally. 4. Nonspecific cardiac arrhythmia. Signed,  Criselda Peaches, MD  [facility]  [facility]   Electronically Signed   By: Jacqulynn Cadet M.D.   On: 07/03/2014 15:34   Dg Chest Port 1 View  07/03/2014   CLINICAL DATA:  Tachycardia.  History of hypertension, bradycardia.  EXAM: PORTABLE CHEST - 1 VIEW  COMPARISON:  Chest radiograph May 06, 2014  FINDINGS: The cardiac silhouette is upper limits of normal in size, mediastinal silhouette is unremarkable. The lungs are clear without pleural effusions or focal consolidations. Trachea projects midline and there is no pneumothorax. Soft tissue planes and included osseous structures are non-suspicious.  IMPRESSION: Borderline cardiomegaly, no acute pulmonary process.   Electronically Signed   By: Elon Alas M.D.   On: 07/03/2014 06:54     Assessment/Plan: 59 y.o. female  known history of hypertension hyperlipidemia anxiety mitral valve prolapse admitted about 2 months ago complained of chest pain. Patient  comes in with generalized weakness, dizziness and A-fib with RVR. For A-fib pt was managed on ASA only.   When questioned about dizziness pt does state she has been having non positional dizziness for the past 6-8 months worse last night.    - Currently on amiodarone gtt. Started on dual anti platelet therapy - I do not think this is a peripheral problem such as inner ear that is contributing to dizziness, but likely intracranial - MRI ordered, I did order MRA to look at posterior circulation - Pt also has slight drift Right side.  - I do think that pt should be started on anticoagulation, but will hold off until imaging is complete since if there is ischemia, would hold of for 5-7 days.  Leotis Pain    07/04/2014, 5:09 PM

## 2014-07-04 NOTE — Consult Note (Signed)
Reason for Consult: atrial fibrillation, transient ischemic attack, chest pain Referring Physician: Dr Anselm Jungling  primary cardiologists Dr. Tana Conch is an 59 y.o. female.  HPI:  59 year old female known history of hypertension hyperlipidemia anxiety mitral valve prolapse admitted about 2 months ago complained of chest pain dizziness after negative troponins patient was discharged home for follow-up in the office patient had of Myoview in the hospital which was okay. The patient states she had had generalized weakness was unable move the some concern that she may have it may have had a TIA so she was admitted for further evaluation she complained of dizziness weakness fatigue. Patient is had recurrent chest pain symptoms slightly improved now now on amiodarone with palpitation and rhythm much more improved. Patient is seen Dr. [name] in the office and with the last episode of atrial fibrillation she was treated conservatively for now as the 2nd breath and about a month and amiodarone is being added. Patient is not on anticoagulation because of a relatively low [place] score and is being maintained on aspirin only.  Past Medical History  Diagnosis Date  . Mitral valve anterior leaflet prolapse   . Hypertension   . Bradycardia   . Hyperlipidemia     Past Surgical History  Procedure Laterality Date  . Appendectomy    . Back surgery    . Abdominal hysterectomy    . Cholecystectomy      Family History  Problem Relation Age of Onset  . Coronary artery disease Sister     Social History:  reports that she quit smoking about 17 years ago. She does not have any smokeless tobacco history on file. She reports that she drinks about 0.6 oz of alcohol per week. She reports that she does not use illicit drugs.  Allergies:  Allergies  Allergen Reactions  . 2,4-D Dimethylamine (Amisol) Nausea Only  . Ace Inhibitors Other (See Comments)    Cramps and cough  . Codeine Nausea Only     Other reaction(s): Hallucination  . Nsaids Other (See Comments)    dyspepsia  . Telmisartan     Other reaction(s): Other (See Comments)  . Prednisone Palpitations    tachycardia    Medications:  Prior to Admission:  Prescriptions prior to admission  Medication Sig Dispense Refill Last Dose  . ALPRAZolam (XANAX) 0.25 MG tablet Take 0.25 mg by mouth at bedtime as needed.  5 Past Week at Unknown time  . aspirin EC 81 MG tablet Take 81 mg by mouth daily.   07/02/2014 at Unknown time  . Cholecalciferol (VITAMIN D3) 1000 UNITS CAPS Take 1,000 Units by mouth daily.   07/02/2014 at Unknown time  . ferrous sulfate 325 (65 FE) MG tablet Take 325 mg by mouth daily.   07/02/2014 at Unknown time  . FLUoxetine (PROZAC) 20 MG tablet Take 40 mg by mouth daily.   07/02/2014 at Unknown time  . HYDROcodone-acetaminophen (NORCO/VICODIN) 5-325 MG per tablet Take 1 tablet by mouth every 6 (six) hours as needed.   Past Week at Unknown time  . losartan (COZAAR) 50 MG tablet Take 50 mg by mouth daily.  1 07/02/2014 at Unknown time  . Multiple Vitamin (MULTI-VITAMINS) TABS Take 1 tablet by mouth daily.   07/02/2014 at Unknown time  . omeprazole (PRILOSEC) 40 MG capsule Take 40 mg by mouth daily.   07/02/2014 at Unknown time  . ondansetron (ZOFRAN) 4 MG tablet Take 4 mg by mouth every 8 (eight) hours as needed.  11 Past Week at Unknown time  . PREMARIN 0.625 MG tablet Take 0.625 mg by mouth daily.  11 07/02/2014 at Unknown time  . zolpidem (AMBIEN) 5 MG tablet Take 5 mg by mouth daily as needed.   Past Week at Unknown time    Results for orders placed or performed during the hospital encounter of 07/03/14 (from the past 48 hour(s))  CBC     Status: None   Collection Time: 07/03/14  6:35 AM  Result Value Ref Range   WBC 8.5 3.6 - 11.0 K/uL   RBC 5.00 3.80 - 5.20 MIL/uL   Hemoglobin 14.1 12.0 - 16.0 g/dL   HCT 42.8 35.0 - 47.0 %   MCV 85.6 80.0 - 100.0 fL   MCH 28.1 26.0 - 34.0 pg   MCHC 32.9 32.0 - 36.0 g/dL   RDW  14.0 11.5 - 14.5 %   Platelets 266 150 - 440 K/uL  Troponin I     Status: None   Collection Time: 07/03/14  6:35 AM  Result Value Ref Range   Troponin I <0.03 <0.031 ng/mL    Comment:        NO INDICATION OF MYOCARDIAL INJURY.   Comprehensive metabolic panel     Status: Abnormal   Collection Time: 07/03/14  6:35 AM  Result Value Ref Range   Sodium 140 135 - 145 mmol/L   Potassium 3.8 3.5 - 5.1 mmol/L   Chloride 107 101 - 111 mmol/L   CO2 23 22 - 32 mmol/L   Glucose, Bld 112 (H) 65 - 99 mg/dL   BUN 16 6 - 20 mg/dL   Creatinine, Ser 0.69 0.44 - 1.00 mg/dL   Calcium 9.3 8.9 - 10.3 mg/dL   Total Protein 7.9 6.5 - 8.1 g/dL   Albumin 4.0 3.5 - 5.0 g/dL   AST 27 15 - 41 U/L   ALT 25 14 - 54 U/L   Alkaline Phosphatase 72 38 - 126 U/L   Total Bilirubin 0.4 0.3 - 1.2 mg/dL   GFR calc non Af Amer >60 >60 mL/min   GFR calc Af Amer >60 >60 mL/min    Comment: (NOTE) The eGFR has been calculated using the CKD EPI equation. This calculation has not been validated in all clinical situations. eGFR's persistently <60 mL/min signify possible Chronic Kidney Disease.    Anion gap 10 5 - 15  Brain natriuretic peptide     Status: Abnormal   Collection Time: 07/03/14  6:35 AM  Result Value Ref Range   B Natriuretic Peptide 114.0 (H) 0.0 - 100.0 pg/mL  Lipid panel     Status: Abnormal   Collection Time: 07/03/14 10:41 AM  Result Value Ref Range   Cholesterol 251 (H) 0 - 200 mg/dL   Triglycerides 103 <150 mg/dL   HDL 74 >40 mg/dL   Total CHOL/HDL Ratio 3.4 RATIO   VLDL 21 0 - 40 mg/dL   LDL Cholesterol 156 (H) 0 - 99 mg/dL    Comment:        Total Cholesterol/HDL:CHD Risk Coronary Heart Disease Risk Table                     Men   Women  1/2 Average Risk   3.4   3.3  Average Risk       5.0   4.4  2 X Average Risk   9.6   7.1  3 X Average Risk  23.4   11.0  Use the calculated Patient Ratio above and the CHD Risk Table to determine the patient's CHD Risk.        ATP III  CLASSIFICATION (LDL):  <100     mg/dL   Optimal  100-129  mg/dL   Near or Above                    Optimal  130-159  mg/dL   Borderline  160-189  mg/dL   High  >190     mg/dL   Very High   Troponin I     Status: None   Collection Time: 07/03/14 10:41 AM  Result Value Ref Range   Troponin I 0.03 <0.031 ng/mL    Comment:        NO INDICATION OF MYOCARDIAL INJURY.   Glucose, capillary     Status: None   Collection Time: 07/03/14 11:54 AM  Result Value Ref Range   Glucose-Capillary 85 65 - 99 mg/dL  Troponin I     Status: Abnormal   Collection Time: 07/03/14  1:01 PM  Result Value Ref Range   Troponin I 0.04 (H) <0.031 ng/mL    Comment: CRITICAL RESULT CALLED TO, READ BACK BY AND VERIFIED WITH BRITNEY KILLINGSWORTH @ 1411 ON 07/03/2014 [facility]        PERSISTENTLY INCREASED TROPONIN VALUES IN THE RANGE OF 0.04-0.49 ng/mL CAN BE SEEN IN:       -UNSTABLE ANGINA       -CONGESTIVE HEART FAILURE       -MYOCARDITIS       -CHEST TRAUMA       -ARRYHTHMIAS       -LATE PRESENTING MYOCARDIAL INFARCTION       -COPD   CLINICAL FOLLOW-UP RECOMMENDED.   CBC     Status: None   Collection Time: 07/03/14  1:01 PM  Result Value Ref Range   WBC 8.0 3.6 - 11.0 K/uL   RBC 5.00 3.80 - 5.20 MIL/uL   Hemoglobin 14.0 12.0 - 16.0 g/dL   HCT 42.8 35.0 - 47.0 %   MCV 85.6 80.0 - 100.0 fL   MCH 28.1 26.0 - 34.0 pg   MCHC 32.8 32.0 - 36.0 g/dL   RDW 14.2 11.5 - 14.5 %   Platelets 262 150 - 440 K/uL  Creatinine, serum     Status: None   Collection Time: 07/03/14  1:01 PM  Result Value Ref Range   Creatinine, Ser 0.68 0.44 - 1.00 mg/dL   GFR calc non Af Amer >60 >60 mL/min   GFR calc Af Amer >60 >60 mL/min    Comment: (NOTE) The eGFR has been calculated using the CKD EPI equation. This calculation has not been validated in all clinical situations. eGFR's persistently <60 mL/min signify possible Chronic Kidney Disease.   MRSA PCR Screening     Status: None   Collection Time: 07/03/14  1:02 PM   Result Value Ref Range   MRSA by PCR NEGATIVE NEGATIVE    Comment:        The GeneXpert MRSA Assay (FDA approved for NASAL specimens only), is one component of a comprehensive MRSA colonization surveillance program. It is not intended to diagnose MRSA infection nor to guide or monitor treatment for MRSA infections.   Troponin I     Status: None   Collection Time: 07/03/14  7:41 PM  Result Value Ref Range   Troponin I 0.03 <0.031 ng/mL    Comment:  NO INDICATION OF MYOCARDIAL INJURY.   Basic metabolic panel     Status: Abnormal   Collection Time: 07/04/14  4:33 AM  Result Value Ref Range   Sodium 136 135 - 145 mmol/L   Potassium 3.9 3.5 - 5.1 mmol/L   Chloride 101 101 - 111 mmol/L   CO2 28 22 - 32 mmol/L   Glucose, Bld 118 (H) 65 - 99 mg/dL   BUN 15 6 - 20 mg/dL   Creatinine, Ser 0.92 0.44 - 1.00 mg/dL   Calcium 8.8 (L) 8.9 - 10.3 mg/dL   GFR calc non Af Amer >60 >60 mL/min   GFR calc Af Amer >60 >60 mL/min    Comment: (NOTE) The eGFR has been calculated using the CKD EPI equation. This calculation has not been validated in all clinical situations. eGFR's persistently <60 mL/min signify possible Chronic Kidney Disease.    Anion gap 7 5 - 15  CBC     Status: None   Collection Time: 07/04/14  4:33 AM  Result Value Ref Range   WBC 7.2 3.6 - 11.0 K/uL   RBC 4.85 3.80 - 5.20 MIL/uL   Hemoglobin 14.0 12.0 - 16.0 g/dL   HCT 41.8 35.0 - 47.0 %   MCV 86.3 80.0 - 100.0 fL   MCH 28.8 26.0 - 34.0 pg   MCHC 33.4 32.0 - 36.0 g/dL   RDW 14.4 11.5 - 14.5 %   Platelets 242 150 - 440 K/uL    Ct Head Wo Contrast  07/03/2014   CLINICAL DATA:  Right-sided facial droop. Right face, arm, and leg tingling.  EXAM: CT HEAD WITHOUT CONTRAST  TECHNIQUE: Contiguous axial images were obtained from the base of the skull through the vertex without intravenous contrast.  COMPARISON:  10/24/2006  FINDINGS: The ventricles and sulci are within normal limits for age. There is no evidence of  acute infarct, intracranial hemorrhage, mass, midline shift, or extra-axial collection.  The orbits are unremarkable. The visualized paranasal sinuses and mastoid air cells are clear. No skull fracture is identified.  IMPRESSION: Unremarkable head CT.  These results were called by telephone at the time of interpretation on 07/03/2014 at 7:56 am to Dr. Lisa Roca , who verbally acknowledged these results.   Electronically Signed   By: Logan Bores   On: 07/03/2014 07:56   US Carotid Bilateral  07/03/2014   CLINICAL DATA:  59 year old female with right-sided facial droop and right face, arm and leg tingling as well as increased heart rate and dizziness  EXAM: BILATERAL CAROTID DUPLEX ULTRASOUND  TECHNIQUE: Pearline Cables scale imaging, color Doppler and duplex ultrasound were performed of bilateral carotid and vertebral arteries in the neck.  COMPARISON:  Head CT performed earlier today  FINDINGS: Criteria: Quantification of carotid stenosis is based on velocity parameters that correlate the residual internal carotid diameter with NASCET-based stenosis levels, using the diameter of the distal internal carotid lumen as the denominator for stenosis measurement.  The following velocity measurements were obtained:  RIGHT  ICA:  67/33 cm/sec  CCA:  65/68 cm/sec  SYSTOLIC ICA/CCA RATIO:  1.1  DIASTOLIC ICA/CCA RATIO:  1.6  ECA:  89 cm/sec  LEFT  ICA:  90/38 cm/sec  CCA:  12/75 cm/sec  SYSTOLIC ICA/CCA RATIO:  1.6  DIASTOLIC ICA/CCA RATIO:  1.7  ECA:  93 cm/sec  RIGHT CAROTID ARTERY: Mild focal smooth heterogeneous atherosclerotic plaque in the proximal internal carotid artery. By peak systolic velocity criteria the estimated stenosis remains less than 50%. The heart  rate is irregular.  RIGHT VERTEBRAL ARTERY: Patent with antegrade flow. The heartbeat is regular.  LEFT CAROTID ARTERY: Trace heterogeneous atherosclerotic plaque in the carotid bifurcation. Intimal medial thickening extends into the proximal internal carotid artery.  No evidence of significant stenosis. The heart beat is irregular.  LEFT VERTEBRAL ARTERY:  Patent with antegrade flow.  IMPRESSION: 1. Mild (1-49%) stenosis of the proximal right internal carotid artery secondary to heterogeneous atherosclerotic plaque. 2. Mild heterogeneous plaque in the left carotid bifurcation without significant internal carotid stenosis. 3. Vertebral arteries are patent with antegrade flow bilaterally. 4. Nonspecific cardiac arrhythmia. Signed,  Criselda Peaches, MD  [facility]  [facility]   Electronically Signed   By: Jacqulynn Cadet M.D.   On: 07/03/2014 15:34   Dg Chest Port 1 View  07/03/2014   CLINICAL DATA:  Tachycardia.  History of hypertension, bradycardia.  EXAM: PORTABLE CHEST - 1 VIEW  COMPARISON:  Chest radiograph May 06, 2014  FINDINGS: The cardiac silhouette is upper limits of normal in size, mediastinal silhouette is unremarkable. The lungs are clear without pleural effusions or focal consolidations. Trachea projects midline and there is no pneumothorax. Soft tissue planes and included osseous structures are non-suspicious.  IMPRESSION: Borderline cardiomegaly, no acute pulmonary process.   Electronically Signed   By: Elon Alas M.D.   On: 07/03/2014 06:54    Review of Systems  Eyes: Negative.   Respiratory: Negative.   Cardiovascular: Positive for chest pain.  Gastrointestinal: Negative.   Genitourinary: Negative.   Musculoskeletal: Negative.   Skin: Negative.   Neurological: Positive for weakness and headaches.  Endo/Heme/Allergies: Negative.   Psychiatric/Behavioral: Positive for depression. The patient is nervous/anxious.    Blood pressure 130/66, pulse 56, temperature 98.2 F (36.8 C), temperature source Oral, resp. rate 18, height _0  (1.778 m), weight 110.678 kg (244 lb), SpO2 98 %. Physical Exam  Constitutional: She is oriented to person, place, and time. She appears well-developed and  well-nourished.  HENT:  Head: Normocephalic and atraumatic.  Right Ear: External ear normal.  Eyes: Conjunctivae are normal. Pupils are equal, round, and reactive to light.  Neck: Normal range of motion. Neck supple.  Cardiovascular: S1 normal, S2 normal, intact distal pulses and normal pulses.  An irregularly irregular rhythm present.  Respiratory: Effort normal and breath sounds normal.  GI: Soft. Bowel sounds are normal.  Musculoskeletal: Normal range of motion.  Neurological: She is alert and oriented to person, place, and time.  Skin: Skin is warm and dry.  Psychiatric: She has a normal mood and affect. Her behavior is normal.    Assessment/Plan:  atrial fibrillation rapid ventricular response  hypertension  obesity  GERD  mitral valve prolapse  hyperlipidemia  anxiety  near-syncope  possible TIA  chest pain  possible angina . PLAN  agree with the mid left myocardial infarction  agree with amiodarone loading and switched to p.o. At a dose of maybe 200 once a day  continue lipid management  With Lipitor  continue anticoagulation with aspirin and Plavix  consider long-term anticoagulation if it is determined that this episode was a TIA  awaiting for MRI results  And Neurology consukt  continue Xanax for anxiety symptoms  recommend Neurology consultation evaluation of possible near-syncope  consider cardiac catheterization because of recurrent chest pain and possible anginal symptoms recently had a negative Myoview  continue Protonix for GERD symptoms  CALLWOOD,DWAYNE D. 07/04/2014, 3:19 PM

## 2014-07-05 ENCOUNTER — Inpatient Hospital Stay: Payer: PRIVATE HEALTH INSURANCE

## 2014-07-05 MED ORDER — APIXABAN 5 MG PO TABS: 5.0000 mg | ORAL_TABLET | Freq: Two times a day (BID) | ORAL | Status: DC

## 2014-07-05 MED ORDER — ATORVASTATIN CALCIUM 40 MG PO TABS: 40.0000 mg | ORAL_TABLET | Freq: Every day | ORAL | Status: DC

## 2014-07-05 NOTE — Progress Notes (Signed)
Discharge instructions explained to pt/ verbalized an understanding/ iv and tele removed/ stroke prevention education given to pt/ transported off unit via wheelchair.

## 2014-07-05 NOTE — Discharge Summary (Signed)
[facility] NAME: Renee [name]    MR#:  [id number]  DATE OF BIRTH:  1956-01-03  DATE OF ADMISSION:  07/03/2014 ADMITTING PHYSICIAN: Vaughan Basta, MD  DATE OF DISCHARGE: 07/05/2014  PRIMARY CARE PHYSICIAN: Dr. Ola Spurr   ADMISSION DIAGNOSIS:  CVA (cerebral infarction) [I63.9] New onset atrial fibrillation [I48.91] Atrial fibrillation with rapid ventricular response [I48.91] Ischemic stroke [I63.50]  DISCHARGE DIAGNOSIS:  Principal Problem:   Atrial fibrillation Active Problems:   CVA (cerebral vascular accident)- ruled out by negative MRI and MRA.   Atrial fibrillation with RVR   SECONDARY DIAGNOSIS:   Past Medical History  Diagnosis Date  . Mitral valve anterior leaflet prolapse   . Hypertension   . Bradycardia   . Hyperlipidemia     HOSPITAL COURSE:   * Atrial fibrillation with rapid ventricular response Already given metoprolol injection by ER, still heart rate was 120 to 130. started on amiodarone IV drip and place and stepdown unit. Converted to NSR- switched to oral. Consult cardiology by Dr. Clayborn Bigness, monitor on telemetry, followed serial troponin, echocardiogram. As stroke ruled out, but she is started on eliquis for future stoke prevention per neurology recommendations.  * Suspected stroke CT head negative, negative MRI brain, echocardiogram, negative carotid Doppler studies. She already takes aspirin at home, Checked lipid panel- LDL high , started on statins. Continue neuro checks, physical therapy evaluation and neurology consult done.   Stroke ruled out by negative studies.  * Hypertension Currently we will allow her blood pressure to be maintained around 150-160. BP under control.  * Anxiety Continue Xanax and fluoxetine.  DISCHARGE CONDITIONS:   Stable.  CONSULTS OBTAINED:  Treatment Team:  Leotis Pain, MD Yolonda Kida, MD Corey Skains, MD  DRUG  ALLERGIES:   Allergies  Allergen Reactions  . 2,4-D Dimethylamine (Amisol) Nausea Only  . Ace Inhibitors Other (See Comments)    Cramps and cough  . Codeine Nausea Only    Other reaction(s): Hallucination  . Nsaids Other (See Comments)    dyspepsia  . Telmisartan     Other reaction(s): Other (See Comments)  . Prednisone Palpitations    tachycardia    DISCHARGE MEDICATIONS:   Current Discharge Medication List    START taking these medications   Details  apixaban (ELIQUIS) 5 MG TABS tablet Take 1 tablet (5 mg total) by mouth 2 (two) times daily. Qty: 60 tablet, Refills: 0    atorvastatin (LIPITOR) 40 MG tablet Take 1 tablet (40 mg total) by mouth daily at 6 PM. Qty: 30 tablet, Refills: 0      CONTINUE these medications which have NOT CHANGED   Details  ALPRAZolam (XANAX) 0.25 MG tablet Take 0.25 mg by mouth at bedtime as needed. Refills: 5    aspirin EC 81 MG tablet Take 81 mg by mouth daily.    Cholecalciferol (VITAMIN D3) 1000 UNITS CAPS Take 1,000 Units by mouth daily.    ferrous sulfate 325 (65 FE) MG tablet Take 325 mg by mouth daily.    FLUoxetine (PROZAC) 20 MG tablet Take 40 mg by mouth daily.    HYDROcodone-acetaminophen (NORCO/VICODIN) 5-325 MG per tablet Take 1 tablet by mouth every 6 (six) hours as needed.    losartan (COZAAR) 50 MG tablet Take 50 mg by mouth daily. Refills: 1    Multiple Vitamin (MULTI-VITAMINS) TABS Take 1 tablet by mouth daily.    omeprazole (PRILOSEC) 40 MG capsule Take 40 mg by mouth daily.  ondansetron (ZOFRAN) 4 MG tablet Take 4 mg by mouth every 8 (eight) hours as needed. Refills: 11    PREMARIN 0.625 MG tablet Take 0.625 mg by mouth daily. Refills: 11    zolpidem (AMBIEN) 5 MG tablet Take 5 mg by mouth daily as needed.         DISCHARGE INSTRUCTIONS:    Follow with cardiology clinic in 2 weeks.  If you experience worsening of your admission symptoms, develop shortness of breath, life threatening emergency,  suicidal or homicidal thoughts you must seek medical attention immediately by calling 911 or calling your MD immediately  if symptoms less severe.  You Must read complete instructions/literature along with all the possible adverse reactions/side effects for all the Medicines you take and that have been prescribed to you. Take any new Medicines after you have completely understood and accept all the possible adverse reactions/side effects.   Please note  You were cared for by a hospitalist during your hospital stay. If you have any questions about your discharge medications or the care you received while you were in the hospital after you are discharged, you can call the unit and asked to speak with the hospitalist on call if the hospitalist that took care of you is not available. Once you are discharged, your primary care physician will handle any further medical issues. Please note that NO REFILLS for any discharge medications will be authorized once you are discharged, as it is imperative that you return to your primary care physician (or establish a relationship with a primary care physician if you do not have one) for your aftercare needs so that they can reassess your need for medications and monitor your lab values.    Today   CHIEF COMPLAINT:   Chief Complaint  Patient presents with  . Tachycardia    HISTORY OF PRESENT ILLNESS:  Renee [name]  is a 59 y.o. female with a known history of hypertension hyperlipidemia, anxiety, mitral valve prolapse. She was admitted 1 month ago in hospital with complaining of chest pain and dizziness but after negative troponins results was discharged with cardiology fellow IN the office. She was seen by Dr. [name] but no further workup was ordered.  Since yesterday afternoon she feels more dizzy and weak last night she did not had good sleep because of feeling of palpitation. Her husband was on work as a Administrator in the night, and she called him  multiple times because of not feeling well. Around 5 in the morning she woke up with palpitation and heaviness in her chest, also had right arm pain and she felt heaviness on her jaw on the right side. So came to emergency room.  In emergency room she was noted to have atrial fibrillation with heart rate up to 140, right-sided facial and limbs weakness, given injection metoprolol 1 time in ER, checked a CT head which is negative. Hospitalist service was called in to admit her for possible stroke and A. fib.   VITAL SIGNS:  Blood pressure 134/71, pulse 56, temperature 97.6 F (36.4 C), temperature source Oral, resp. rate 18, height 5\' 10"  (1.778 m), weight 110.406 kg (243 lb 6.4 oz), SpO2 97 %.  I/O:   Intake/Output Summary (Last 24 hours) at 07/05/14 1405 Last data filed at 07/05/14 1350  Gross per 24 hour  Intake    360 ml  Output   2650 ml  Net  -2290 ml    PHYSICAL EXAMINATION:  GENERAL: 59 y.o.-year-old patient lying  in the bed with no acute distress.  EYES: Pupils equal, round, reactive to light and accommodation. No scleral icterus. Extraocular muscles intact.  HEENT: Head atraumatic, normocephalic. Oropharynx and nasopharynx clear.  NECK: Supple, no jugular venous distention. No thyroid enlargement, no tenderness.  LUNGS: Normal breath sounds bilaterally, no wheezing, rales,rhonchi or crepitation. No use of accessory muscles of respiration.  CARDIOVASCULAR: S1, S2 normal. No murmurs, rubs, or gallops.  ABDOMEN: Soft, nontender, nondistended. Bowel sounds present. No organomegaly or mass.  EXTREMITIES: No pedal edema, cyanosis, or clubbing.  NEUROLOGIC: right side face weakness almost resolved now. Muscle strength 4/5 in right side extremities. Sensation intact. Gait not checked.  PSYCHIATRIC: The patient is alert and oriented x 3.  SKIN: No obvious rash, lesion, or ulcer.   DATA REVIEW:   CBC  Recent Labs Lab 07/04/14 0433  WBC 7.2  HGB 14.0  HCT 41.8  PLT  242    Chemistries   Recent Labs Lab 07/03/14 0635  07/04/14 0433  NA 140  --  136  K 3.8  --  3.9  CL 107  --  101  CO2 23  --  28  GLUCOSE 112*  --  118*  BUN 16  --  15  CREATININE 0.69  < > 0.92  CALCIUM 9.3  --  8.8*  AST 27  --   --   ALT 25  --   --   ALKPHOS 72  --   --   BILITOT 0.4  --   --   < > = values in this interval not displayed.  Cardiac Enzymes  Recent Labs Lab 07/03/14 1941  TROPONINI 0.03    Microbiology Results  Results for orders placed or performed during the hospital encounter of 07/03/14  MRSA PCR Screening     Status: None   Collection Time: 07/03/14  1:02 PM  Result Value Ref Range Status   MRSA by PCR NEGATIVE NEGATIVE Final    Comment:        The GeneXpert MRSA Assay (FDA approved for NASAL specimens only), is one component of a comprehensive MRSA colonization surveillance program. It is not intended to diagnose MRSA infection nor to guide or monitor treatment for MRSA infections.     RADIOLOGY:  Mr Virgel Paling Wo Contrast  07/05/2014   CLINICAL DATA:  59 year old female with dizziness, shortness of breath, right side jaw pain and numbness, headache, tachycardia. Initial encounter.  EXAM: MRI HEAD WITHOUT CONTRAST  MRA HEAD WITHOUT CONTRAST  TECHNIQUE: Multiplanar, multiecho pulse sequences of the brain and surrounding structures were obtained without intravenous contrast. Angiographic images of the head were obtained using MRA technique without contrast.  COMPARISON:  Head CT without contrast 07/03/2014 and earlier  FINDINGS: MRI HEAD FINDINGS  Cerebral volume is within normal limits for age. No restricted diffusion to suggest acute infarction. No midline shift, mass effect, evidence of mass lesion, ventriculomegaly, extra-axial collection or acute intracranial hemorrhage. Cervicomedullary junction and pituitary are within normal limits. Major intracranial vascular flow voids are within normal limits. Negative visualized cervical spine.   Scattered small mostly subcortical cerebral white matter T2 and FLAIR hyperintensity in both hemispheres. No chronic blood products or cortical encephalomalacia. Deep gray matter nuclei, brainstem and cerebellum are normal. Visible internal auditory structures appear normal. Mastoids are clear.  Visualized orbit soft tissues are within normal limits. Trace paranasal sinus mucosal thickening. Visualized scalp soft tissues are within normal limits. Normal bone marrow signal. No right mandible abnormality is evident.  MRA HEAD FINDINGS  Antegrade flow in the posterior circulation. Codominant distal vertebral arteries. Normal left PICA origin. The right PICA origin is not identified but the visible right PICA appears normal. Patent vertebrobasilar junction and basilar artery without stenosis. SCA and PCA origins are normal. Posterior communicating arteries are present. Bilateral PCA branches are normal.  Antegrade flow in both ICA siphons no siphon stenosis. Ophthalmic and posterior communicating artery origins are normal. Normal carotid termini, MCA and ACA origins. Diminutive anterior communicating artery. Visualized bilateral ACA and MCA branches are within normal limits.  IMPRESSION: 1.  No acute intracranial abnormality. 2. Mild to moderate for age nonspecific cerebral white matter signal changes. Otherwise normal noncontrast MRI appearance of the brain. 3.  Negative intracranial MRA.   Electronically Signed   By: Genevie Ann M.D.   On: 07/05/2014 08:52   Mr Brain Wo Contrast  07/05/2014   CLINICAL DATA:  59 year old female with dizziness, shortness of breath, right side jaw pain and numbness, headache, tachycardia. Initial encounter.  EXAM: MRI HEAD WITHOUT CONTRAST  MRA HEAD WITHOUT CONTRAST  TECHNIQUE: Multiplanar, multiecho pulse sequences of the brain and surrounding structures were obtained without intravenous contrast. Angiographic images of the head were obtained using MRA technique without contrast.   COMPARISON:  Head CT without contrast 07/03/2014 and earlier  FINDINGS: MRI HEAD FINDINGS  Cerebral volume is within normal limits for age. No restricted diffusion to suggest acute infarction. No midline shift, mass effect, evidence of mass lesion, ventriculomegaly, extra-axial collection or acute intracranial hemorrhage. Cervicomedullary junction and pituitary are within normal limits. Major intracranial vascular flow voids are within normal limits. Negative visualized cervical spine.  Scattered small mostly subcortical cerebral white matter T2 and FLAIR hyperintensity in both hemispheres. No chronic blood products or cortical encephalomalacia. Deep gray matter nuclei, brainstem and cerebellum are normal. Visible internal auditory structures appear normal. Mastoids are clear.  Visualized orbit soft tissues are within normal limits. Trace paranasal sinus mucosal thickening. Visualized scalp soft tissues are within normal limits. Normal bone marrow signal. No right mandible abnormality is evident.  MRA HEAD FINDINGS  Antegrade flow in the posterior circulation. Codominant distal vertebral arteries. Normal left PICA origin. The right PICA origin is not identified but the visible right PICA appears normal. Patent vertebrobasilar junction and basilar artery without stenosis. SCA and PCA origins are normal. Posterior communicating arteries are present. Bilateral PCA branches are normal.  Antegrade flow in both ICA siphons no siphon stenosis. Ophthalmic and posterior communicating artery origins are normal. Normal carotid termini, MCA and ACA origins. Diminutive anterior communicating artery. Visualized bilateral ACA and MCA branches are within normal limits.  IMPRESSION: 1.  No acute intracranial abnormality. 2. Mild to moderate for age nonspecific cerebral white matter signal changes. Otherwise normal noncontrast MRI appearance of the brain. 3.  Negative intracranial MRA.   Electronically Signed   By: Genevie Ann M.D.   On:  07/05/2014 08:52    Management plans discussed with the patient, family and they are in agreement.  CODE STATUS:     Code Status Orders        Start     Ordered   07/03/14 1205  Full code   Continuous     07/03/14 1204      TOTAL TIME TAKING CARE OF THIS PATIENT: 40 minutes.   Vaughan Basta M.D on 07/05/2014 at 2:05 PM  Between 7am to 6pm - Pager - [phone]  After 6pm go to [email or web address] - password [email or web address] [facility]  [facility]  Office  [phone]  CC: Primary care physician; No primary care provider on file.

## 2014-07-05 NOTE — Consult Note (Signed)
CC: dizziness   HPI: Renee [name] is an 59 y.o. female  known history of hypertension hyperlipidemia anxiety mitral valve prolapse admitted about 2 months ago complained of chest pain. Patient comes in with generalized weakness, dizziness and A-fib with RVR. For A-fib pt was managed on ASA only.   When questioned about dizziness pt does state she has been having non positional dizziness for the past 6-8 months worse last night.    HA has resolved s/p steroids and magnesium  Past Medical History  Diagnosis Date  . Mitral valve anterior leaflet prolapse   . Hypertension   . Bradycardia   . Hyperlipidemia     Past Surgical History  Procedure Laterality Date  . Appendectomy    . Back surgery    . Abdominal hysterectomy    . Cholecystectomy      Family History  Problem Relation Age of Onset  . Coronary artery disease Sister     Social History:  reports that she quit smoking about 17 years ago. She does not have any smokeless tobacco history on file. She reports that she drinks about 0.6 oz of alcohol per week. She reports that she does not use illicit drugs.  Allergies  Allergen Reactions  . 2,4-D Dimethylamine (Amisol) Nausea Only  . Ace Inhibitors Other (See Comments)    Cramps and cough  . Codeine Nausea Only    Other reaction(s): Hallucination  . Nsaids Other (See Comments)    dyspepsia  . Telmisartan     Other reaction(s): Other (See Comments)  . Prednisone Palpitations    tachycardia    Medications: I have reviewed the patient's current medications.  ROS: History obtained from the patient  General ROS: negative for - chills, fatigue, fever, night sweats, weight gain or weight loss Psychological ROS: negative for - behavioral disorder, hallucinations, memory difficulties, mood swings or suicidal ideation Ophthalmic ROS: negative for - blurry vision, double vision, eye pain or loss of vision ENT ROS: negative for - epistaxis, nasal discharge, oral lesions,  sore throat, tinnitus or vertigo Allergy and Immunology ROS: negative for - hives or itchy/watery eyes Hematological and Lymphatic ROS: negative for - bleeding problems, bruising or swollen lymph nodes Endocrine ROS: negative for - galactorrhea, hair pattern changes, polydipsia/polyuria or temperature intolerance Respiratory ROS: negative for - cough, hemoptysis, shortness of breath or wheezing Cardiovascular ROS: negative for - chest pain, dyspnea on exertion, edema or irregular heartbeat Gastrointestinal ROS: negative for - abdominal pain, diarrhea, hematemesis, nausea/vomiting or stool incontinence Genito-Urinary ROS: negative for - dysuria, hematuria, incontinence or urinary frequency/urgency Musculoskeletal ROS: negative for - joint swelling or muscular weakness Neurological ROS: as noted in HPI Dermatological ROS: negative for rash and skin lesion changes  Physical Examination: Blood pressure 134/71, pulse 56, temperature 97.6 F (36.4 C), temperature source Oral, resp. rate 18, height 5\' 10"  (1.778 m), weight 110.406 kg (243 lb 6.4 oz), SpO2 97 %.  History obtained from the patient  HEENT-  Normocephalic, no lesions, without obvious abnormality.  Normal external eye and conjunctiva.  Normal TM's bilaterally.  Normal auditory canals and external ears. Normal external nose, mucus membranes and septum.  Normal pharynx. Cardiovascular- irregularly irregular rhythm, pulses palpable throughout   Lungs- Heart exam - S1, S2 normal, no murmur, no gallop, rate regular Abdomen- soft, non-tender; bowel sounds normal; no masses,  no organomegaly Extremities- less then 2 second capillary refill Lymph-no adenopathy palpable Musculoskeletal-no joint tenderness, deformity or swelling Skin-warm and dry, no hyperpigmentation, vitiligo, or  suspicious lesions  Neurological Examination Mental Status: Alert, oriented, thought content appropriate.  Speech fluent without evidence of aphasia.  Able to  follow 3 step commands without difficulty. Cranial Nerves: II: Discs flat bilaterally; Visual fields grossly normal, pupils equal, round, reactive to light and accommodation III,IV, VI: ptosis not present, extra-ocular motions intact bilaterally V,VII: smile symmetric, facial light touch sensation normal bilaterally VIII: hearing normal bilaterally IX,X: gag reflex present XI: bilateral shoulder shrug XII: midline tongue extension Motor: Right : Upper extremity   4+/5    Left:     Upper extremity   4+/5  Lower extremity   5/5     Lower extremity   5/5 Tone and bulk:normal tone throughout; no atrophy noted Sensory: Pinprick and light touch intact throughout, bilaterally Deep Tendon Reflexes: 2+ and symmetric throughout Plantars: Right: downgoing   Left: downgoing Cerebellar: normal finger-to-nose, normal rapid alternating movements and normal heel-to-shin test Gait: normal gait and station     Laboratory Studies:   Basic Metabolic Panel:  Recent Labs Lab 07/03/14 0635 07/03/14 1301 07/04/14 0433  NA 140  --  136  K 3.8  --  3.9  CL 107  --  101  CO2 23  --  28  GLUCOSE 112*  --  118*  BUN 16  --  15  CREATININE 0.69 0.68 0.92  CALCIUM 9.3  --  8.8*    Liver Function Tests:  Recent Labs Lab 07/03/14 0635  AST 27  ALT 25  ALKPHOS 72  BILITOT 0.4  PROT 7.9  ALBUMIN 4.0   No results for input(s): LIPASE, AMYLASE in the last 168 hours. No results for input(s): AMMONIA in the last 168 hours.  CBC:  Recent Labs Lab 07/03/14 0635 07/03/14 1301 07/04/14 0433  WBC 8.5 8.0 7.2  HGB 14.1 14.0 14.0  HCT 42.8 42.8 41.8  MCV 85.6 85.6 86.3  PLT 266 262 242    Cardiac Enzymes:  Recent Labs Lab 07/03/14 0635 07/03/14 1041 07/03/14 1301 07/03/14 1941  TROPONINI <0.03 0.03 0.04* 0.03    BNP: Invalid input(s): POCBNP  CBG:  Recent Labs Lab 07/03/14 1154  GLUCAP 85    Microbiology: Results for orders placed or performed during the hospital  encounter of 07/03/14  MRSA PCR Screening     Status: None   Collection Time: 07/03/14  1:02 PM  Result Value Ref Range Status   MRSA by PCR NEGATIVE NEGATIVE Final    Comment:        The GeneXpert MRSA Assay (FDA approved for NASAL specimens only), is one component of a comprehensive MRSA colonization surveillance program. It is not intended to diagnose MRSA infection nor to guide or monitor treatment for MRSA infections.     Coagulation Studies: No results for input(s): LABPROT, INR in the last 72 hours.  Urinalysis: No results for input(s): COLORURINE, LABSPEC, PHURINE, GLUCOSEU, HGBUR, BILIRUBINUR, KETONESUR, PROTEINUR, UROBILINOGEN, NITRITE, LEUKOCYTESUR in the last 168 hours.  Invalid input(s): APPERANCEUR  Lipid Panel:     Component Value Date/Time   CHOL 251* 07/03/2014 1041   TRIG 103 07/03/2014 1041   HDL 74 07/03/2014 1041   CHOLHDL 3.4 07/03/2014 1041   VLDL 21 07/03/2014 1041   LDLCALC 156* 07/03/2014 1041    HgbA1C: No results found for: HGBA1C  Urine Drug Screen:  No results found for: LABOPIA, COCAINSCRNUR, LABBENZ, AMPHETMU, THCU, LABBARB  Alcohol Level: No results for input(s): ETH in the last 168 hours.  Other results: EKG: atrial fibrillation, rate 130.  Imaging: Mr Virgel Paling Wo Contrast  07/05/2014   CLINICAL DATA:  59 year old female with dizziness, shortness of breath, right side jaw pain and numbness, headache, tachycardia. Initial encounter.  EXAM: MRI HEAD WITHOUT CONTRAST  MRA HEAD WITHOUT CONTRAST  TECHNIQUE: Multiplanar, multiecho pulse sequences of the brain and surrounding structures were obtained without intravenous contrast. Angiographic images of the head were obtained using MRA technique without contrast.  COMPARISON:  Head CT without contrast 07/03/2014 and earlier  FINDINGS: MRI HEAD FINDINGS  Cerebral volume is within normal limits for age. No restricted diffusion to suggest acute infarction. No midline shift, mass effect, evidence of  mass lesion, ventriculomegaly, extra-axial collection or acute intracranial hemorrhage. Cervicomedullary junction and pituitary are within normal limits. Major intracranial vascular flow voids are within normal limits. Negative visualized cervical spine.  Scattered small mostly subcortical cerebral white matter T2 and FLAIR hyperintensity in both hemispheres. No chronic blood products or cortical encephalomalacia. Deep gray matter nuclei, brainstem and cerebellum are normal. Visible internal auditory structures appear normal. Mastoids are clear.  Visualized orbit soft tissues are within normal limits. Trace paranasal sinus mucosal thickening. Visualized scalp soft tissues are within normal limits. Normal bone marrow signal. No right mandible abnormality is evident.  MRA HEAD FINDINGS  Antegrade flow in the posterior circulation. Codominant distal vertebral arteries. Normal left PICA origin. The right PICA origin is not identified but the visible right PICA appears normal. Patent vertebrobasilar junction and basilar artery without stenosis. SCA and PCA origins are normal. Posterior communicating arteries are present. Bilateral PCA branches are normal.  Antegrade flow in both ICA siphons no siphon stenosis. Ophthalmic and posterior communicating artery origins are normal. Normal carotid termini, MCA and ACA origins. Diminutive anterior communicating artery. Visualized bilateral ACA and MCA branches are within normal limits.  IMPRESSION: 1.  No acute intracranial abnormality. 2. Mild to moderate for age nonspecific cerebral white matter signal changes. Otherwise normal noncontrast MRI appearance of the brain. 3.  Negative intracranial MRA.   Electronically Signed   By: Genevie Ann M.D.   On: 07/05/2014 08:52   Mr Brain Wo Contrast  07/05/2014   CLINICAL DATA:  59 year old female with dizziness, shortness of breath, right side jaw pain and numbness, headache, tachycardia. Initial encounter.  EXAM: MRI HEAD WITHOUT  CONTRAST  MRA HEAD WITHOUT CONTRAST  TECHNIQUE: Multiplanar, multiecho pulse sequences of the brain and surrounding structures were obtained without intravenous contrast. Angiographic images of the head were obtained using MRA technique without contrast.  COMPARISON:  Head CT without contrast 07/03/2014 and earlier  FINDINGS: MRI HEAD FINDINGS  Cerebral volume is within normal limits for age. No restricted diffusion to suggest acute infarction. No midline shift, mass effect, evidence of mass lesion, ventriculomegaly, extra-axial collection or acute intracranial hemorrhage. Cervicomedullary junction and pituitary are within normal limits. Major intracranial vascular flow voids are within normal limits. Negative visualized cervical spine.  Scattered small mostly subcortical cerebral white matter T2 and FLAIR hyperintensity in both hemispheres. No chronic blood products or cortical encephalomalacia. Deep gray matter nuclei, brainstem and cerebellum are normal. Visible internal auditory structures appear normal. Mastoids are clear.  Visualized orbit soft tissues are within normal limits. Trace paranasal sinus mucosal thickening. Visualized scalp soft tissues are within normal limits. Normal bone marrow signal. No right mandible abnormality is evident.  MRA HEAD FINDINGS  Antegrade flow in the posterior circulation. Codominant distal vertebral arteries. Normal left PICA origin. The right PICA origin is not identified but the visible right PICA appears normal.  Patent vertebrobasilar junction and basilar artery without stenosis. SCA and PCA origins are normal. Posterior communicating arteries are present. Bilateral PCA branches are normal.  Antegrade flow in both ICA siphons no siphon stenosis. Ophthalmic and posterior communicating artery origins are normal. Normal carotid termini, MCA and ACA origins. Diminutive anterior communicating artery. Visualized bilateral ACA and MCA branches are within normal limits.   IMPRESSION: 1.  No acute intracranial abnormality. 2. Mild to moderate for age nonspecific cerebral white matter signal changes. Otherwise normal noncontrast MRI appearance of the brain. 3.  Negative intracranial MRA.   Electronically Signed   By: Genevie Ann M.D.   On: 07/05/2014 08:52     Assessment/Plan: 59 y.o. female  known history of hypertension hyperlipidemia anxiety mitral valve prolapse admitted about 2 months ago complained of chest pain. Patient comes in with generalized weakness, dizziness and A-fib with RVR. For A-fib pt was managed on ASA only.     HA resolved currently back to baseline.   - d/c ASA and plavix - start elaquis 5mg  BID - d/c planning - on PO amiodarone - MRI and MRA no acute abnormality - d/w cardiology.    Leotis Pain    07/05/2014, 2:20 PM

## 2014-07-05 NOTE — Progress Notes (Signed)
Physical Therapy Treatment Patient Details Name: Renee [name] MRN: [id number] DOB: [birth year] Today's Date: 07/05/2014    History of Present Illness presented to ER secondary to elevated HR and dizziness, chest pain; admitted with a-fib and to rule out CVA (CT negative)    PT Comments    HR stable and WFL (50-60s) throughout session; negative orthostatics.  Minimal/no reports of dizziness this date.  Continues with mild instability during dynamic gait components (esp with speed modulation and vertical head turns), but patient able to self-correct and self-initiate compensatory strategies as needed. Continues to decline follow up PT services upon discharge.  Follow Up Recommendations  No PT follow up (patient declining, "I have to work")     Equipment Recommendations       Recommendations for Other Services       Precautions / Restrictions Precautions Precautions: Fall Restrictions Weight Bearing Restrictions: No    Mobility  Bed Mobility Overal bed mobility: Independent                Transfers Overall transfer level: Independent                  Ambulation/Gait Ambulation/Gait assistance: Min guard Ambulation Distance (Feet): 440 Feet Assistive device: None       General Gait Details: reciprocal stepping with mild inconsistency in foot placement, occasionally scissoring and crossing midline (able to self-correct LOB).  No noted episodes of veering laterally this date, though generally unsteady with dynamic gait components.   Stairs            Wheelchair Mobility    Modified Rankin (Stroke Patients Only)       Balance                                    Cognition                            Exercises Other Exercises Other Exercises: Additional exercises deferred this date; patients lunch tray present    General Comments        Pertinent Vitals/Pain Pain Assessment: No/denies pain    Home Living                       Prior Function            PT Goals (current goals can now be found in the care plan section) Acute Rehab PT Goals Patient Stated Goal: "I just want to go home PT Goal Formulation: With patient/family Time For Goal Achievement: 07/18/14 Potential to Achieve Goals: Good Progress towards PT goals: Progressing toward goals    Frequency  Min 2X/week    PT Plan Current plan remains appropriate    Co-evaluation             End of Session Equipment Utilized During Treatment: Gait belt Activity Tolerance: Patient tolerated treatment well Patient left: in chair;with call bell/phone within reach     Time: 1141-1158 PT Time Calculation (min) (ACUTE ONLY): 17 min  Charges:  $Gait Training: 8-22 mins                    G Codes:      Amarya Kuehl H. Owens Shark, PT, DPT 07/05/2014, 1:10 PM [phone]

## 2014-07-06 NOTE — Progress Notes (Signed)
I spoke to pt about missing amiodarone prescription. Got her pharmacy's number- [facility] at [address]- [phone] I called in prescription for amiodarone 200 mg oral daily for pt to pick up from pharmacy.

## 2014-08-06 ENCOUNTER — Ambulatory Visit
Admission: RE | Admit: 2014-08-06 | Payer: No Typology Code available for payment source | Source: Ambulatory Visit | Admitting: Unknown Physician Specialty

## 2014-08-06 ENCOUNTER — Encounter: Admission: RE | Payer: Self-pay | Source: Ambulatory Visit

## 2014-08-06 SURGERY — COLONOSCOPY WITH PROPOFOL
Anesthesia: General

## 2014-10-22 ENCOUNTER — Other Ambulatory Visit: Payer: Self-pay | Admitting: Infectious Diseases

## 2014-10-22 DIAGNOSIS — Z1231 Encounter for screening mammogram for malignant neoplasm of breast: Secondary | ICD-10-CM

## 2014-10-22 DIAGNOSIS — Z1239 Encounter for other screening for malignant neoplasm of breast: Secondary | ICD-10-CM

## 2014-11-03 ENCOUNTER — Ambulatory Visit
Admission: RE | Admit: 2014-11-03 | Discharge: 2014-11-03 | Disposition: A | Discharge status: Home / Self Care | Payer: PRIVATE HEALTH INSURANCE | Source: Ambulatory Visit | Attending: Infectious Diseases | Admitting: Infectious Diseases

## 2014-11-03 DIAGNOSIS — Z1231 Encounter for screening mammogram for malignant neoplasm of breast: Secondary | ICD-10-CM | POA: Insufficient documentation

## 2014-11-03 DIAGNOSIS — Z1239 Encounter for other screening for malignant neoplasm of breast: Secondary | ICD-10-CM

## 2014-11-04 ENCOUNTER — Other Ambulatory Visit: Payer: Self-pay | Admitting: Infectious Diseases

## 2014-11-04 DIAGNOSIS — R928 Other abnormal and inconclusive findings on diagnostic imaging of breast: Secondary | ICD-10-CM

## 2014-11-10 ENCOUNTER — Other Ambulatory Visit: Payer: No Typology Code available for payment source

## 2014-11-10 ENCOUNTER — Ambulatory Visit
Admission: RE | Admit: 2014-11-10 | Discharge: 2014-11-10 | Disposition: A | Discharge status: Home / Self Care | Payer: PRIVATE HEALTH INSURANCE | Source: Ambulatory Visit | Attending: Infectious Diseases | Admitting: Infectious Diseases

## 2014-11-10 DIAGNOSIS — R928 Other abnormal and inconclusive findings on diagnostic imaging of breast: Secondary | ICD-10-CM | POA: Diagnosis not present

## 2015-03-18 ENCOUNTER — Encounter: Payer: Self-pay | Admitting: *Deleted

## 2015-03-21 ENCOUNTER — Encounter: Payer: Self-pay | Admitting: Anesthesiology

## 2015-03-21 ENCOUNTER — Ambulatory Visit
Admission: RE | Admit: 2015-03-21 | Discharge: 2015-03-21 | Disposition: A | Discharge status: Home / Self Care | Payer: Managed Care, Other (non HMO) | Source: Ambulatory Visit | Attending: Unknown Physician Specialty | Admitting: Unknown Physician Specialty

## 2015-03-21 ENCOUNTER — Ambulatory Visit: Payer: Managed Care, Other (non HMO) | Admitting: Anesthesiology

## 2015-03-21 ENCOUNTER — Encounter
Admission: RE | Disposition: A | Discharge status: Home / Self Care | Payer: Self-pay | Source: Ambulatory Visit | Attending: Unknown Physician Specialty

## 2015-03-21 DIAGNOSIS — Z1211 Encounter for screening for malignant neoplasm of colon: Secondary | ICD-10-CM | POA: Insufficient documentation

## 2015-03-21 DIAGNOSIS — Z87891 Personal history of nicotine dependence: Secondary | ICD-10-CM | POA: Insufficient documentation

## 2015-03-21 DIAGNOSIS — Z8673 Personal history of transient ischemic attack (TIA), and cerebral infarction without residual deficits: Secondary | ICD-10-CM | POA: Diagnosis not present

## 2015-03-21 DIAGNOSIS — I10 Essential (primary) hypertension: Secondary | ICD-10-CM | POA: Insufficient documentation

## 2015-03-21 DIAGNOSIS — Z888 Allergy status to other drugs, medicaments and biological substances status: Secondary | ICD-10-CM | POA: Diagnosis not present

## 2015-03-21 DIAGNOSIS — E785 Hyperlipidemia, unspecified: Secondary | ICD-10-CM | POA: Insufficient documentation

## 2015-03-21 DIAGNOSIS — Z7982 Long term (current) use of aspirin: Secondary | ICD-10-CM | POA: Diagnosis not present

## 2015-03-21 DIAGNOSIS — Z79899 Other long term (current) drug therapy: Secondary | ICD-10-CM | POA: Diagnosis not present

## 2015-03-21 DIAGNOSIS — Z885 Allergy status to narcotic agent status: Secondary | ICD-10-CM | POA: Diagnosis not present

## 2015-03-21 DIAGNOSIS — I4891 Unspecified atrial fibrillation: Secondary | ICD-10-CM | POA: Insufficient documentation

## 2015-03-21 DIAGNOSIS — K648 Other hemorrhoids: Secondary | ICD-10-CM | POA: Diagnosis not present

## 2015-03-21 DIAGNOSIS — D120 Benign neoplasm of cecum: Secondary | ICD-10-CM | POA: Diagnosis not present

## 2015-03-21 DIAGNOSIS — Z7901 Long term (current) use of anticoagulants: Secondary | ICD-10-CM | POA: Diagnosis not present

## 2015-03-21 DIAGNOSIS — Z886 Allergy status to analgesic agent status: Secondary | ICD-10-CM | POA: Diagnosis not present

## 2015-03-21 DIAGNOSIS — K219 Gastro-esophageal reflux disease without esophagitis: Secondary | ICD-10-CM | POA: Insufficient documentation

## 2015-03-21 DIAGNOSIS — F329 Major depressive disorder, single episode, unspecified: Secondary | ICD-10-CM | POA: Insufficient documentation

## 2015-03-21 HISTORY — DX: Sick sinus syndrome: I49.5

## 2015-03-21 HISTORY — PX: COLONOSCOPY WITH PROPOFOL: SHX5780

## 2015-03-21 HISTORY — DX: Gastro-esophageal reflux disease without esophagitis: K21.9

## 2015-03-21 HISTORY — DX: Transient cerebral ischemic attack, unspecified: G45.9

## 2015-03-21 HISTORY — DX: Unspecified atrial fibrillation: I48.91

## 2015-03-21 HISTORY — DX: Depression, unspecified: F32.A

## 2015-03-21 HISTORY — DX: Major depressive disorder, single episode, unspecified: F32.9

## 2015-03-21 HISTORY — DX: Personal history of adenomatous and serrated colon polyps: Z86.0101

## 2015-03-21 HISTORY — DX: Syncope and collapse: R55

## 2015-03-21 HISTORY — DX: Personal history of colonic polyps: Z86.010

## 2015-03-21 SURGERY — COLONOSCOPY WITH PROPOFOL
Anesthesia: General

## 2015-03-21 MED ORDER — PROPOFOL 10 MG/ML IV BOLUS
INTRAVENOUS | Status: DC | PRN
  Administered 2015-03-21: 20 mg via INTRAVENOUS
  Administered 2015-03-21: 50 mg via INTRAVENOUS
  Administered 2015-03-21 (×2): 20 mg via INTRAVENOUS

## 2015-03-21 MED ORDER — SODIUM CHLORIDE 0.9 % IV SOLN: INTRAVENOUS | Status: DC

## 2015-03-21 MED ORDER — LIDOCAINE HCL (PF) 2 % IJ SOLN
INTRAMUSCULAR | Status: DC | PRN
  Administered 2015-03-21: 60 mg

## 2015-03-21 MED ORDER — ONDANSETRON HCL 4 MG/2ML IJ SOLN
4.0000 mg | Freq: Once | INTRAMUSCULAR | Status: AC | PRN
  Administered 2015-03-21: 4 mg via INTRAVENOUS

## 2015-03-21 MED ORDER — PROPOFOL 500 MG/50ML IV EMUL
INTRAVENOUS | Status: DC | PRN
  Administered 2015-03-21: 100 ug/kg/min via INTRAVENOUS

## 2015-03-21 MED ORDER — FENTANYL CITRATE (PF) 100 MCG/2ML IJ SOLN
INTRAMUSCULAR | Status: DC | PRN
  Administered 2015-03-21: 50 ug via INTRAVENOUS
  Administered 2015-03-21: 25 ug via INTRAVENOUS
  Administered 2015-03-21: 50 ug via INTRAVENOUS

## 2015-03-21 MED ORDER — FENTANYL CITRATE (PF) 100 MCG/2ML IJ SOLN: 25.0000 ug | INTRAMUSCULAR | Status: DC | PRN

## 2015-03-21 MED ORDER — SODIUM CHLORIDE 0.9 % IV SOLN
INTRAVENOUS | Status: DC
  Administered 2015-03-21: 1000 mL via INTRAVENOUS

## 2015-03-21 MED ORDER — MIDAZOLAM HCL 5 MG/5ML IJ SOLN
INTRAMUSCULAR | Status: DC | PRN
  Administered 2015-03-21 (×3): 1 mg via INTRAVENOUS

## 2015-03-21 NOTE — Op Note (Addendum)
[facility] Patient Name: Renee [name] Procedure Date: 03/21/2015 10:01 AM MRN: [id number] Account #: [id number] Date of Birth: March 24, 1955 Admit Type: Outpatient Age: 60 Room: [facility] ENDO ROOM 1 Gender: Female Note Status: Supervisor Override Procedure:            Colonoscopy Indications:          High risk colon cancer surveillance: Personal history                        of colonic polyps Providers:            Manya Silvas, MD Referring MD:         Youlanda Roys. Ola Spurr, MD (Referring MD) Medicines:            Propofol per Anesthesia Complications:        No immediate complications. Procedure:            Pre-Anesthesia Assessment:                       - After reviewing the risks and benefits, the patient                        was deemed in satisfactory condition to undergo the                        procedure.                       After obtaining informed consent, the colonoscope was                        passed under direct vision. Throughout the procedure,                        the patient's blood pressure, pulse, and oxygen                        saturations were monitored continuously. The                        Colonoscope was introduced through the anus and                        advanced to the the cecum, identified by appendiceal                        orifice and ileocecal valve. The colonoscopy was                        technically difficult and complex due to significant                        looping and a tortuous colon. Successful completion of                        the procedure was aided by applying abdominal pressure.                        The patient tolerated the procedure well. The quality  of the bowel preparation was excellent. Findings:      The colon was long and tortuous and difficult with the 190 scope it was       easier than the previous exam.      A 12 mm polyp was found in the  cecum. The polyp was sessile. The polyp       was removed with a hot snare. Resection and retrieval were complete. To       prevent bleeding after the polypectomy, two hemostatic clips were       successfully placed. There was no bleeding during, or at the end, of the       procedure. Done with 3 bites of the snare.      A small polyp was found in the cecum. The polyp was sessile. The polyp       was removed with a hot snare. Resection and retrieval were complete. To       prevent bleeding after the polypectomy, two hemostatic clips were       successfully placed. There was no bleeding during, or at the end, of the       procedure.      Internal hemorrhoids were found during endoscopy. The hemorrhoids were       small. Impression:           - One 12 mm polyp in the cecum, removed with a hot                        snare. Resected and retrieved. Clips were placed.                       - One small polyp in the cecum, removed with a hot                        snare. Resected and retrieved. Clips were placed.                       - Internal hemorrhoids. Recommendation:       - Await pathology results. Manya Silvas, MD 03/21/2015 11:00:18 AM This report has been signed electronically. Number of Addenda: 0 Note Initiated On: 03/21/2015 10:01 AM Scope Withdrawal Time: 0 hours 18 minutes 25 seconds  Total Procedure Duration: 0 hours 48 minutes 38 seconds       [facility]
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
## 2015-03-21 NOTE — OR Nursing (Signed)
Patient admitted to postop with c/o nausea, no vomiting.  zofran 4mg . Iv given as ordered.  Patient c/o abd cramping with some relief after turning on side with pillow to abdominal area. Tolerating sips of water per patient request despite nausea.

## 2015-03-21 NOTE — OR Nursing (Signed)
Patient asleep. Appears comfortable. Husband at bedside

## 2015-03-21 NOTE — Anesthesia Postprocedure Evaluation (Signed)
Anesthesia Post Note  Patient: Renee [name]  Procedure(s) Performed: Procedure(s) (LRB): COLONOSCOPY WITH PROPOFOL (N/A)  Patient location during evaluation: PACU Anesthesia Type: General Level of consciousness: awake and alert and oriented Pain management: pain level controlled Vital Signs Assessment: post-procedure vital signs reviewed and stable Respiratory status: spontaneous breathing Cardiovascular status: blood pressure returned to baseline Anesthetic complications: no    Last Vitals:  Filed Vitals:   03/21/15 1142 03/21/15 1152  BP: 125/52 107/58  Pulse: 55 52  Temp:    Resp: 13 19    Last Pain:  Filed Vitals:   03/21/15 1156  PainSc: Asleep                 Tycho Cheramie

## 2015-03-21 NOTE — Transfer of Care (Signed)
Immediate Anesthesia Transfer of Care Note  Patient: Renee [name]  Procedure(s) Performed: Procedure(s): COLONOSCOPY WITH PROPOFOL (N/A)  Patient Location: PACU  Anesthesia Type:General  Level of Consciousness: sedated  Airway & Oxygen Therapy: Patient Spontanous Breathing and Patient connected to nasal cannula oxygen  Post-op Assessment: Report given to RN and Post -op Vital signs reviewed and stable  Post vital signs: Reviewed and stable  Last Vitals:  Filed Vitals:   03/21/15 0941  BP: 191/92  Pulse: 56  Temp: 36.8 C  Resp: 16    Complications: No apparent anesthesia complications

## 2015-03-21 NOTE — Anesthesia Preprocedure Evaluation (Signed)
Anesthesia Evaluation  Patient identified by MRN, date of birth, ID band Patient awake    Reviewed: Allergy & Precautions, NPO status , Patient's Chart, lab work & pertinent test results  Airway Mallampati: II  TM Distance: <3 FB Neck ROM: Full    Dental  (+) Partial Upper, Caps   Pulmonary former smoker,    Pulmonary exam normal        Cardiovascular hypertension, Pt. on medications + angina with exertion Normal cardiovascular exam+ dysrhythmias Atrial Fibrillation + Valvular Problems/Murmurs MVP      Neuro/Psych Depression TIA   GI/Hepatic Neg liver ROS, GERD  Medicated and Controlled,Colon polyp   Endo/Other  negative endocrine ROS  Renal/GU negative Renal ROS  negative genitourinary   Musculoskeletal negative musculoskeletal ROS (+)   Abdominal Normal abdominal exam  (+)   Peds negative pediatric ROS (+)  Hematology negative hematology ROS (+)   Anesthesia Other Findings   Reproductive/Obstetrics                             Anesthesia Physical Anesthesia Plan  ASA: III  Anesthesia Plan: General   Post-op Pain Management:    Induction: Intravenous  Airway Management Planned: Nasal Cannula  Additional Equipment:   Intra-op Plan:   Post-operative Plan:   Informed Consent: I have reviewed the patients History and Physical, chart, labs and discussed the procedure including the risks, benefits and alternatives for the proposed anesthesia with the patient or authorized representative who has indicated his/her understanding and acceptance.   Dental advisory given  Plan Discussed with: CRNA and Surgeon  Anesthesia Plan Comments:         Anesthesia Quick Evaluation

## 2015-03-22 LAB — SURGICAL PATHOLOGY

## 2015-03-23 ENCOUNTER — Encounter: Payer: Self-pay | Admitting: Unknown Physician Specialty

## 2015-04-06 ENCOUNTER — Encounter: Payer: Self-pay | Admitting: Medical Oncology

## 2015-04-06 ENCOUNTER — Emergency Department: Payer: Managed Care, Other (non HMO)

## 2015-04-06 ENCOUNTER — Emergency Department
Admission: EM | Admit: 2015-04-06 | Discharge: 2015-04-06 | Disposition: A | Discharge status: Home / Self Care | Payer: Managed Care, Other (non HMO) | Attending: Emergency Medicine | Admitting: Emergency Medicine

## 2015-04-06 DIAGNOSIS — G5621 Lesion of ulnar nerve, right upper limb: Secondary | ICD-10-CM

## 2015-04-06 DIAGNOSIS — Z79899 Other long term (current) drug therapy: Secondary | ICD-10-CM | POA: Diagnosis not present

## 2015-04-06 DIAGNOSIS — Z87891 Personal history of nicotine dependence: Secondary | ICD-10-CM | POA: Diagnosis not present

## 2015-04-06 DIAGNOSIS — Z7982 Long term (current) use of aspirin: Secondary | ICD-10-CM | POA: Insufficient documentation

## 2015-04-06 DIAGNOSIS — M79601 Pain in right arm: Secondary | ICD-10-CM | POA: Insufficient documentation

## 2015-04-06 DIAGNOSIS — R51 Headache: Secondary | ICD-10-CM | POA: Diagnosis present

## 2015-04-06 DIAGNOSIS — M6281 Muscle weakness (generalized): Secondary | ICD-10-CM | POA: Diagnosis not present

## 2015-04-06 DIAGNOSIS — I10 Essential (primary) hypertension: Secondary | ICD-10-CM | POA: Diagnosis not present

## 2015-04-06 DIAGNOSIS — G629 Polyneuropathy, unspecified: Secondary | ICD-10-CM | POA: Diagnosis not present

## 2015-04-06 DIAGNOSIS — J209 Acute bronchitis, unspecified: Secondary | ICD-10-CM | POA: Diagnosis not present

## 2015-04-06 DIAGNOSIS — R0789 Other chest pain: Secondary | ICD-10-CM | POA: Insufficient documentation

## 2015-04-06 DIAGNOSIS — Z7901 Long term (current) use of anticoagulants: Secondary | ICD-10-CM | POA: Diagnosis not present

## 2015-04-06 LAB — CBC WITH DIFFERENTIAL/PLATELET
BASOS ABS: 0 10*3/uL (ref 0–0.1)
BASOS PCT: 1 %
Eosinophils Absolute: 0.1 10*3/uL (ref 0–0.7)
Eosinophils Relative: 2 %
HEMATOCRIT: 37.5 % (ref 35.0–47.0)
HEMOGLOBIN: 12.4 g/dL (ref 12.0–16.0)
Lymphocytes Relative: 20 %
Lymphs Abs: 1 10*3/uL (ref 1.0–3.6)
MCH: 27.9 pg (ref 26.0–34.0)
MCHC: 33 g/dL (ref 32.0–36.0)
MCV: 84.5 fL (ref 80.0–100.0)
MONOS PCT: 9 %
Monocytes Absolute: 0.4 10*3/uL (ref 0.2–0.9)
NEUTROS ABS: 3.4 10*3/uL (ref 1.4–6.5)
NEUTROS PCT: 68 %
Platelets: 184 10*3/uL (ref 150–440)
RBC: 4.44 MIL/uL (ref 3.80–5.20)
RDW: 14.5 % (ref 11.5–14.5)
WBC: 4.9 10*3/uL (ref 3.6–11.0)

## 2015-04-06 LAB — COMPREHENSIVE METABOLIC PANEL
ALBUMIN: 3.8 g/dL (ref 3.5–5.0)
ALK PHOS: 69 U/L (ref 38–126)
ALT: 20 U/L (ref 14–54)
AST: 26 U/L (ref 15–41)
Anion gap: 5 (ref 5–15)
BILIRUBIN TOTAL: 0.5 mg/dL (ref 0.3–1.2)
BUN: 10 mg/dL (ref 6–20)
CALCIUM: 8.5 mg/dL — AB (ref 8.9–10.3)
CO2: 28 mmol/L (ref 22–32)
Chloride: 101 mmol/L (ref 101–111)
Creatinine, Ser: 0.88 mg/dL (ref 0.44–1.00)
GFR calc Af Amer: >60 mL/min (ref >60)
GFR calc non Af Amer: >60 mL/min (ref >60)
GLUCOSE: 114 mg/dL — AB (ref 65–99)
Potassium: 3.5 mmol/L (ref 3.5–5.1)
Sodium: 134 mmol/L — ABNORMAL LOW (ref 135–145)
TOTAL PROTEIN: 7.1 g/dL (ref 6.5–8.1)

## 2015-04-06 LAB — TROPONIN I: Troponin I: <0.03 ng/mL (ref <0.031)

## 2015-04-06 MED ORDER — ALBUTEROL SULFATE HFA 108 (90 BASE) MCG/ACT IN AERS: 2.0000 | INHALATION_SPRAY | Freq: Four times a day (QID) | RESPIRATORY_TRACT | Status: DC | PRN

## 2015-04-06 MED ORDER — IOHEXOL 300 MG/ML  SOLN
75.0000 mL | Freq: Once | INTRAMUSCULAR | Status: AC | PRN
  Administered 2015-04-06: 75 mL via INTRAVENOUS

## 2015-04-06 MED ORDER — LORAZEPAM 1 MG PO TABS: 1.0000 mg | ORAL_TABLET | Freq: Two times a day (BID) | ORAL | Status: DC | PRN

## 2015-04-06 MED ORDER — LORAZEPAM 2 MG/ML IJ SOLN
0.5000 mg | Freq: Once | INTRAMUSCULAR | Status: AC
  Administered 2015-04-06: 0.5 mg via INTRAVENOUS
  Filled 2015-04-06: qty 1

## 2015-04-06 MED ORDER — IPRATROPIUM-ALBUTEROL 0.5-2.5 (3) MG/3ML IN SOLN
3.0000 mL | Freq: Once | RESPIRATORY_TRACT | Status: AC
  Administered 2015-04-06: 3 mL via RESPIRATORY_TRACT
  Filled 2015-04-06: qty 3

## 2015-04-06 NOTE — Discharge Instructions (Signed)
How to Use an Inhaler Proper inhaler technique is very important. Good technique ensures that the medicine reaches the lungs. Poor technique results in depositing the medicine on the tongue and back of the throat rather than in the airways. If you do not use the inhaler with good technique, the medicine will not help you. STEPS TO FOLLOW IF USING AN INHALER WITHOUT AN EXTENSION TUBE  Remove the cap from the inhaler.  If you are using the inhaler for the first time, you will need to prime it. Shake the inhaler for 5 seconds and release four puffs into the air, away from your face. Ask your health care provider or pharmacist if you have questions about priming your inhaler.  Shake the inhaler for 5 seconds before each breath in (inhalation).  Position the inhaler so that the top of the canister faces up.  Put your index finger on the top of the medicine canister. Your thumb supports the bottom of the inhaler.  Open your mouth.  Either place the inhaler between your teeth and place your lips tightly around the mouthpiece, or hold the inhaler 1-2 inches away from your open mouth. If you are unsure of which technique to use, ask your health care provider.  Breathe out (exhale) normally and as completely as possible.  Press the canister down with your index finger to release the medicine.  At the same time as the canister is pressed, inhale deeply and slowly until your lungs are completely filled. This should take 4-6 seconds. Keep your tongue down.  Hold the medicine in your lungs for 5-10 seconds (10 seconds is best). This helps the medicine get into the small airways of your lungs.  Breathe out slowly, through pursed lips. Whistling is an example of pursed lips.  Wait at least 15-30 seconds between puffs. Continue with the above steps until you have taken the number of puffs your health care provider has ordered. Do not use the inhaler more than your health care provider tells  you.  Replace the cap on the inhaler.  Follow the directions from your health care provider or the inhaler insert for cleaning the inhaler. STEPS TO FOLLOW IF USING AN INHALER WITH AN EXTENSION (SPACER)  Remove the cap from the inhaler.  If you are using the inhaler for the first time, you will need to prime it. Shake the inhaler for 5 seconds and release four puffs into the air, away from your face. Ask your health care provider or pharmacist if you have questions about priming your inhaler.  Shake the inhaler for 5 seconds before each breath in (inhalation).  Place the open end of the spacer onto the mouthpiece of the inhaler.  Position the inhaler so that the top of the canister faces up and the spacer mouthpiece faces you.  Put your index finger on the top of the medicine canister. Your thumb supports the bottom of the inhaler and the spacer.  Breathe out (exhale) normally and as completely as possible.  Immediately after exhaling, place the spacer between your teeth and into your mouth. Close your lips tightly around the spacer.  Press the canister down with your index finger to release the medicine.  At the same time as the canister is pressed, inhale deeply and slowly until your lungs are completely filled. This should take 4-6 seconds. Keep your tongue down and out of the way.  Hold the medicine in your lungs for 5-10 seconds (10 seconds is best). This helps the  medicine get into the small airways of your lungs. Exhale.  Repeat inhaling deeply through the spacer mouthpiece. Again hold that breath for up to 10 seconds (10 seconds is best). Exhale slowly. If it is difficult to take this second deep breath through the spacer, breathe normally several times through the spacer. Remove the spacer from your mouth.  Wait at least 15-30 seconds between puffs. Continue with the above steps until you have taken the number of puffs your health care provider has ordered. Do not use the  inhaler more than your health care provider tells you.  Remove the spacer from the inhaler, and place the cap on the inhaler.  Follow the directions from your health care provider or the inhaler insert for cleaning the inhaler and spacer. If you are using different kinds of inhalers, use your quick relief medicine to open the airways 10-15 minutes before using a steroid if instructed to do so by your health care provider. If you are unsure which inhalers to use and the order of using them, ask your health care provider, nurse, or respiratory therapist. If you are using a steroid inhaler, always rinse your mouth with water after your last puff, then gargle and spit out the water. Do not swallow the water. AVOID:  Inhaling before or after starting the spray of medicine. It takes practice to coordinate your breathing with triggering the spray.  Inhaling through the nose (rather than the mouth) when triggering the spray. HOW TO DETERMINE IF YOUR INHALER IS FULL OR NEARLY EMPTY You cannot know when an inhaler is empty by shaking it. A few inhalers are now being made with dose counters. Ask your health care provider for a prescription that has a dose counter if you feel you need that extra help. If your inhaler does not have a counter, ask your health care provider to help you determine the date you need to refill your inhaler. Write the refill date on a calendar or your inhaler canister. Refill your inhaler 7-10 days before it runs out. Be sure to keep an adequate supply of medicine. This includes making sure it is not expired, and that you have a spare inhaler.  SEEK MEDICAL CARE IF:   Your symptoms are only partially relieved with your inhaler.  You are having trouble using your inhaler.  You have some increase in phlegm. SEEK IMMEDIATE MEDICAL CARE IF:   You feel little or no relief with your inhalers. You are still wheezing and are feeling shortness of breath or tightness in your chest or  both.  You have dizziness, headaches, or a fast heart rate.  You have chills, fever, or night sweats.  You have a noticeable increase in phlegm production, or there is blood in the phlegm. MAKE SURE YOU:   Understand these instructions.  Will watch your condition.  Will get help right away if you are not doing well or get worse.   This information is not intended to replace advice given to you by your health care provider. Make sure you discuss any questions you have with your health care provider.   Document Released: 01/06/2000 Document Revised: 10/29/2012 Document Reviewed: 08/07/2012 Elsevier Interactive Patient Education 2016 Elsevier Inc.  Upper Respiratory Infection, Adult Most upper respiratory infections (URIs) are caused by a virus. A URI affects the nose, throat, and upper air passages. The most common type of URI is often called "the common cold." HOME CARE   Take medicines only as told by your doctor.  Gargle warm saltwater or take cough drops to comfort your throat as told by your doctor.  Use a warm mist humidifier or inhale steam from a shower to increase air moisture. This may make it easier to breathe.  Drink enough fluid to keep your pee (urine) clear or pale yellow.  Eat soups and other clear broths.  Have a healthy diet.  Rest as needed.  Go back to work when your fever is gone or your doctor says it is okay.  You may need to stay home longer to avoid giving your URI to others.  You can also wear a face mask and wash your hands often to prevent spread of the virus.  Use your inhaler more if you have asthma.  Do not use any tobacco products, including cigarettes, chewing tobacco, or electronic cigarettes. If you need help quitting, ask your doctor. GET HELP IF:  You are getting worse, not better.  Your symptoms are not helped by medicine.  You have chills.  You are getting more short of breath.  You have brown or red mucus.  You have  yellow or brown discharge from your nose.  You have pain in your face, especially when you bend forward.  You have a fever.  You have puffy (swollen) neck glands.  You have pain while swallowing.  You have white areas in the back of your throat. GET HELP RIGHT AWAY IF:   You have very bad or constant:  Headache.  Ear pain.  Pain in your forehead, behind your eyes, and over your cheekbones (sinus pain).  Chest pain.  You have long-lasting (chronic) lung disease and any of the following:  Wheezing.  Long-lasting cough.  Coughing up blood.  A change in your usual mucus.  You have a stiff neck.  You have changes in your:  Vision.  Hearing.  Thinking.  Mood. MAKE SURE YOU:   Understand these instructions.  Will watch your condition.  Will get help right away if you are not doing well or get worse.   This information is not intended to replace advice given to you by your health care provider. Make sure you discuss any questions you have with your health care provider.   Document Released: 06/27/2007 Document Revised: 05/25/2014 Document Reviewed: 04/15/2013 Elsevier Interactive Patient Education [employer].  Please return immediately if condition worsens. Please contact her primary physician or the physician you were given for referral. If you have any specialist physicians involved in her treatment and plan please also contact them. Thank you for using [facility].
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
## 2015-04-06 NOTE — ED Notes (Signed)
Pt reports this am she began having rt arm pain that radiates into shoulder and up into neck and base of her head. Pt reports she has also been having dizziness and central chest tightness. Pt states that she was seen yesterday by pcp and told she had bronchitis, was placed on abx.

## 2015-10-09 ENCOUNTER — Encounter (HOSPITAL_COMMUNITY): Payer: Self-pay | Admitting: Neurology

## 2015-10-09 ENCOUNTER — Inpatient Hospital Stay (HOSPITAL_COMMUNITY)
Admission: EM | Admit: 2015-10-09 | Discharge: 2015-10-11 | DRG: 287 | Disposition: A | Discharge status: Home / Self Care | Payer: Managed Care, Other (non HMO) | Attending: Cardiology | Admitting: Cardiology

## 2015-10-09 DIAGNOSIS — I209 Angina pectoris, unspecified: Secondary | ICD-10-CM

## 2015-10-09 DIAGNOSIS — Z8673 Personal history of transient ischemic attack (TIA), and cerebral infarction without residual deficits: Secondary | ICD-10-CM

## 2015-10-09 DIAGNOSIS — M8580 Other specified disorders of bone density and structure, unspecified site: Secondary | ICD-10-CM

## 2015-10-09 DIAGNOSIS — Z79899 Other long term (current) drug therapy: Secondary | ICD-10-CM

## 2015-10-09 DIAGNOSIS — K219 Gastro-esophageal reflux disease without esophagitis: Secondary | ICD-10-CM | POA: Diagnosis present

## 2015-10-09 DIAGNOSIS — Z823 Family history of stroke: Secondary | ICD-10-CM | POA: Diagnosis not present

## 2015-10-09 DIAGNOSIS — I69354 Hemiplegia and hemiparesis following cerebral infarction affecting left non-dominant side: Secondary | ICD-10-CM

## 2015-10-09 DIAGNOSIS — I69311 Memory deficit following cerebral infarction: Secondary | ICD-10-CM | POA: Diagnosis not present

## 2015-10-09 DIAGNOSIS — I10 Essential (primary) hypertension: Secondary | ICD-10-CM | POA: Diagnosis present

## 2015-10-09 DIAGNOSIS — I4891 Unspecified atrial fibrillation: Secondary | ICD-10-CM

## 2015-10-09 DIAGNOSIS — Z8249 Family history of ischemic heart disease and other diseases of the circulatory system: Secondary | ICD-10-CM | POA: Diagnosis not present

## 2015-10-09 DIAGNOSIS — F329 Major depressive disorder, single episode, unspecified: Secondary | ICD-10-CM | POA: Diagnosis present

## 2015-10-09 DIAGNOSIS — I208 Other forms of angina pectoris: Secondary | ICD-10-CM | POA: Diagnosis not present

## 2015-10-09 DIAGNOSIS — R55 Syncope and collapse: Secondary | ICD-10-CM | POA: Diagnosis present

## 2015-10-09 DIAGNOSIS — I248 Other forms of acute ischemic heart disease: Secondary | ICD-10-CM | POA: Diagnosis present

## 2015-10-09 DIAGNOSIS — I480 Paroxysmal atrial fibrillation: Secondary | ICD-10-CM | POA: Diagnosis present

## 2015-10-09 DIAGNOSIS — Z87891 Personal history of nicotine dependence: Secondary | ICD-10-CM | POA: Diagnosis not present

## 2015-10-09 DIAGNOSIS — I69328 Other speech and language deficits following cerebral infarction: Secondary | ICD-10-CM

## 2015-10-09 DIAGNOSIS — E785 Hyperlipidemia, unspecified: Secondary | ICD-10-CM | POA: Diagnosis present

## 2015-10-09 DIAGNOSIS — I341 Nonrheumatic mitral (valve) prolapse: Secondary | ICD-10-CM | POA: Diagnosis present

## 2015-10-09 DIAGNOSIS — Z7901 Long term (current) use of anticoagulants: Secondary | ICD-10-CM

## 2015-10-09 LAB — COMPREHENSIVE METABOLIC PANEL
ALBUMIN: 3.7 g/dL (ref 3.5–5.0)
ALT: 19 U/L (ref 14–54)
ALT: 19 U/L (ref 14–54)
ANION GAP: 10 (ref 5–15)
AST: 27 U/L (ref 15–41)
AST: 26 U/L (ref 15–41)
Albumin: 3.4 g/dL — ABNORMAL LOW (ref 3.5–5.0)
Alkaline Phosphatase: 68 U/L (ref 38–126)
Alkaline Phosphatase: 65 U/L (ref 38–126)
Anion gap: 10 (ref 5–15)
BUN: 10 mg/dL (ref 6–20)
BUN: 9 mg/dL (ref 6–20)
CO2: 22 mmol/L (ref 22–32)
CO2: 22 mmol/L (ref 22–32)
Calcium: 9.4 mg/dL (ref 8.9–10.3)
Calcium: 9 mg/dL (ref 8.9–10.3)
Chloride: 107 mmol/L (ref 101–111)
Chloride: 106 mmol/L (ref 101–111)
Creatinine, Ser: 0.78 mg/dL (ref 0.44–1.00)
Creatinine, Ser: 0.84 mg/dL (ref 0.44–1.00)
GFR calc Af Amer: >60 mL/min (ref >60)
GFR calc non Af Amer: >60 mL/min (ref >60)
GFR calc non Af Amer: >60 mL/min (ref >60)
GLUCOSE: 121 mg/dL — AB (ref 65–99)
Glucose, Bld: 151 mg/dL — ABNORMAL HIGH (ref 65–99)
POTASSIUM: 3.6 mmol/L (ref 3.5–5.1)
Potassium: 3.2 mmol/L — ABNORMAL LOW (ref 3.5–5.1)
SODIUM: 139 mmol/L (ref 135–145)
Sodium: 138 mmol/L (ref 135–145)
Total Bilirubin: 0.8 mg/dL (ref 0.3–1.2)
Total Bilirubin: 0.6 mg/dL (ref 0.3–1.2)
Total Protein: 7 g/dL (ref 6.5–8.1)
Total Protein: 6.5 g/dL (ref 6.5–8.1)

## 2015-10-09 LAB — CBC WITH DIFFERENTIAL/PLATELET
BASOS PCT: 0 %
Basophils Absolute: 0 10*3/uL (ref 0.0–0.1)
EOS ABS: 0.1 10*3/uL (ref 0.0–0.7)
EOS PCT: 1 %
HCT: 42.1 % (ref 36.0–46.0)
Hemoglobin: 13.6 g/dL (ref 12.0–15.0)
Lymphocytes Relative: 26 %
Lymphs Abs: 1.6 10*3/uL (ref 0.7–4.0)
MCH: 28.1 pg (ref 26.0–34.0)
MCHC: 32.3 g/dL (ref 30.0–36.0)
MCV: 87 fL (ref 78.0–100.0)
MONO ABS: 0.4 10*3/uL (ref 0.1–1.0)
MONOS PCT: 6 %
NEUTROS PCT: 67 %
Neutro Abs: 4.1 10*3/uL (ref 1.7–7.7)
PLATELETS: 242 10*3/uL (ref 150–400)
RBC: 4.84 MIL/uL (ref 3.87–5.11)
RDW: 13.7 % (ref 11.5–15.5)
WBC: 6.1 10*3/uL (ref 4.0–10.5)

## 2015-10-09 LAB — CBC
HCT: 40.4 % (ref 36.0–46.0)
Hemoglobin: 13 g/dL (ref 12.0–15.0)
MCH: 28.1 pg (ref 26.0–34.0)
MCHC: 32.2 g/dL (ref 30.0–36.0)
MCV: 87.4 fL (ref 78.0–100.0)
Platelets: 235 10*3/uL (ref 150–400)
RBC: 4.62 MIL/uL (ref 3.87–5.11)
RDW: 13.7 % (ref 11.5–15.5)
WBC: 7.7 10*3/uL (ref 4.0–10.5)

## 2015-10-09 LAB — URINALYSIS, ROUTINE W REFLEX MICROSCOPIC
BILIRUBIN URINE: NEGATIVE
GLUCOSE, UA: NEGATIVE mg/dL
Hgb urine dipstick: NEGATIVE
KETONES UR: NEGATIVE mg/dL
LEUKOCYTES UA: NEGATIVE
NITRITE: NEGATIVE
PH: 8.5 — AB (ref 5.0–8.0)
PROTEIN: NEGATIVE mg/dL
Specific Gravity, Urine: 1.006 (ref 1.005–1.030)

## 2015-10-09 LAB — I-STAT CHEM 8, ED
BUN: 10 mg/dL (ref 6–20)
CALCIUM ION: 1.06 mmol/L — AB (ref 1.15–1.40)
CREATININE: 0.7 mg/dL (ref 0.44–1.00)
Chloride: 106 mmol/L (ref 101–111)
GLUCOSE: 117 mg/dL — AB (ref 65–99)
HCT: 42 % (ref 36.0–46.0)
Hemoglobin: 14.3 g/dL (ref 12.0–15.0)
Potassium: 3.6 mmol/L (ref 3.5–5.1)
SODIUM: 140 mmol/L (ref 135–145)
TCO2: 23 mmol/L (ref 0–100)

## 2015-10-09 LAB — I-STAT VENOUS BLOOD GAS, ED
ACID-BASE EXCESS: 2 mmol/L (ref 0.0–2.0)
Bicarbonate: 22.5 mmol/L (ref 20.0–28.0)
O2 SAT: 97 %
PCO2 VEN: 24.4 mmHg — AB (ref 44.0–60.0)
PO2 VEN: 74 mmHg — AB (ref 32.0–45.0)
TCO2: 23 mmol/L (ref 0–100)
pH, Ven: 7.573 — ABNORMAL HIGH (ref 7.250–7.430)

## 2015-10-09 LAB — TROPONIN I: Troponin I: 0.04 ng/mL (ref <0.03)

## 2015-10-09 LAB — MRSA PCR SCREENING: MRSA by PCR: NEGATIVE

## 2015-10-09 LAB — I-STAT TROPONIN, ED: TROPONIN I, POC: 0.01 ng/mL (ref 0.00–0.08)

## 2015-10-09 LAB — TSH: TSH: 2.515 u[IU]/mL (ref 0.350–4.500)

## 2015-10-09 LAB — I-STAT CG4 LACTIC ACID, ED: Lactic Acid, Venous: 2.69 mmol/L (ref 0.5–1.9)

## 2015-10-09 LAB — I-STAT BETA HCG BLOOD, ED (MC, WL, AP ONLY)

## 2015-10-09 LAB — MAGNESIUM: Magnesium: 1.7 mg/dL (ref 1.7–2.4)

## 2015-10-09 LAB — BRAIN NATRIURETIC PEPTIDE: B NATRIURETIC PEPTIDE 5: 105.5 pg/mL — AB (ref 0.0–100.0)

## 2015-10-09 MED ORDER — FERROUS SULFATE 325 (65 FE) MG PO TABS
325.0000 mg | ORAL_TABLET | Freq: Every day | ORAL | Status: DC
  Administered 2015-10-09 – 2015-10-11 (×3): 325 mg via ORAL
  Filled 2015-10-09 (×3): qty 1

## 2015-10-09 MED ORDER — HEPARIN (PORCINE) IN NACL 100-0.45 UNIT/ML-% IJ SOLN
1250.0000 [IU]/h | INTRAMUSCULAR | Status: DC
  Administered 2015-10-09: 1250 [IU]/h via INTRAVENOUS
  Filled 2015-10-09: qty 250

## 2015-10-09 MED ORDER — ASPIRIN EC 81 MG PO TBEC
81.0000 mg | DELAYED_RELEASE_TABLET | Freq: Every day | ORAL | Status: DC
  Administered 2015-10-10 – 2015-10-11 (×2): 81 mg via ORAL
  Filled 2015-10-09 (×3): qty 1

## 2015-10-09 MED ORDER — ALPRAZOLAM 0.25 MG PO TABS
0.2500 mg | ORAL_TABLET | Freq: Every evening | ORAL | Status: DC | PRN
Start: 2015-10-09 — End: 2015-10-10
  Administered 2015-10-09: 0.25 mg via ORAL
  Filled 2015-10-09: qty 1

## 2015-10-09 MED ORDER — VITAMIN D3 25 MCG (1000 UT) PO CAPS: 1000.0000 [IU] | ORAL_CAPSULE | Freq: Every day | ORAL | Status: DC

## 2015-10-09 MED ORDER — ASPIRIN EC 81 MG PO TBEC: 81.0000 mg | DELAYED_RELEASE_TABLET | Freq: Every day | ORAL | Status: DC

## 2015-10-09 MED ORDER — ADULT MULTIVITAMIN W/MINERALS CH
1.0000 | ORAL_TABLET | Freq: Every day | ORAL | Status: DC
  Administered 2015-10-09 – 2015-10-11 (×3): 1 via ORAL
  Filled 2015-10-09 (×4): qty 1

## 2015-10-09 MED ORDER — ATORVASTATIN CALCIUM 40 MG PO TABS
40.0000 mg | ORAL_TABLET | Freq: Every day | ORAL | Status: DC
  Administered 2015-10-09 – 2015-10-10 (×2): 40 mg via ORAL
  Filled 2015-10-09 (×3): qty 1

## 2015-10-09 MED ORDER — ESTROGENS CONJUGATED 0.625 MG PO TABS
0.6250 mg | ORAL_TABLET | Freq: Every day | ORAL | Status: DC
  Administered 2015-10-10 – 2015-10-11 (×2): 0.625 mg via ORAL
  Filled 2015-10-09 (×3): qty 1

## 2015-10-09 MED ORDER — DILTIAZEM HCL 25 MG/5ML IV SOLN
10.0000 mg | Freq: Once | INTRAVENOUS | Status: AC
  Administered 2015-10-09: 10 mg via INTRAVENOUS
  Filled 2015-10-09: qty 5

## 2015-10-09 MED ORDER — PREGABALIN 25 MG PO CAPS
50.0000 mg | ORAL_CAPSULE | Freq: Every evening | ORAL | Status: DC | PRN
  Administered 2015-10-09 – 2015-10-11 (×3): 50 mg via ORAL
  Filled 2015-10-09: qty 1
  Filled 2015-10-09 (×2): qty 2

## 2015-10-09 MED ORDER — PANTOPRAZOLE SODIUM 40 MG PO TBEC
40.0000 mg | DELAYED_RELEASE_TABLET | Freq: Every day | ORAL | Status: DC
Start: 2015-10-09 — End: 2015-10-11
  Administered 2015-10-09 – 2015-10-11 (×3): 40 mg via ORAL
  Filled 2015-10-09 (×3): qty 1

## 2015-10-09 MED ORDER — MAGNESIUM OXIDE 400 (241.3 MG) MG PO TABS
400.0000 mg | ORAL_TABLET | Freq: Every day | ORAL | Status: DC
  Administered 2015-10-09 – 2015-10-11 (×3): 400 mg via ORAL
  Filled 2015-10-09 (×3): qty 1

## 2015-10-09 MED ORDER — APIXABAN 5 MG PO TABS: 5.0000 mg | ORAL_TABLET | Freq: Two times a day (BID) | ORAL | Status: DC

## 2015-10-09 MED ORDER — LORAZEPAM 1 MG PO TABS: 1.0000 mg | ORAL_TABLET | Freq: Two times a day (BID) | ORAL | Status: DC | PRN

## 2015-10-09 MED ORDER — MAGNESIUM OXIDE 400 MG PO TABS: 400.0000 mg | ORAL_TABLET | Freq: Every day | ORAL | Status: DC

## 2015-10-09 MED ORDER — ESMOLOL HCL-SODIUM CHLORIDE 2000 MG/100ML IV SOLN
25.0000 ug/kg/min | INTRAVENOUS | Status: DC
  Filled 2015-10-09: qty 100

## 2015-10-09 MED ORDER — LOSARTAN POTASSIUM 50 MG PO TABS
50.0000 mg | ORAL_TABLET | Freq: Every day | ORAL | Status: DC
  Administered 2015-10-09 – 2015-10-11 (×3): 50 mg via ORAL
  Filled 2015-10-09 (×3): qty 1

## 2015-10-09 MED ORDER — VITAMIN D 1000 UNITS PO TABS
1000.0000 [IU] | ORAL_TABLET | Freq: Every day | ORAL | Status: DC
  Administered 2015-10-09 – 2015-10-11 (×3): 1000 [IU] via ORAL
  Filled 2015-10-09 (×3): qty 1

## 2015-10-09 MED ORDER — ALBUTEROL SULFATE (2.5 MG/3ML) 0.083% IN NEBU: 2.5000 mg | INHALATION_SOLUTION | RESPIRATORY_TRACT | Status: DC | PRN

## 2015-10-09 MED ORDER — APIXABAN 5 MG PO TABS
5.0000 mg | ORAL_TABLET | Freq: Once | ORAL | Status: AC
  Administered 2015-10-09: 5 mg via ORAL
  Filled 2015-10-09: qty 1

## 2015-10-09 MED ORDER — LEVALBUTEROL TARTRATE 45 MCG/ACT IN AERO: 2.0000 | INHALATION_SPRAY | RESPIRATORY_TRACT | Status: DC | PRN

## 2015-10-09 MED ORDER — FLUOXETINE HCL 20 MG PO CAPS
40.0000 mg | ORAL_CAPSULE | Freq: Every day | ORAL | Status: DC
  Administered 2015-10-09 – 2015-10-11 (×3): 40 mg via ORAL
  Filled 2015-10-09 (×5): qty 2

## 2015-10-09 MED ORDER — ALBUTEROL SULFATE (5 MG/ML) 0.5% IN NEBU: 2.5000 mg | INHALATION_SOLUTION | RESPIRATORY_TRACT | Status: DC | PRN
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
## 2015-10-09 NOTE — ED Notes (Signed)
Attempted to give report 
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
## 2015-10-09 NOTE — ED Triage Notes (Signed)
Per ems- Pt comes from home onset CP last night. In AFIB 60-180. Tried 30 cardizem, 18 adenosine with no change. EMS found pt to be pale, diaphoretic initially, sob. Has improved, she is alert BP 150/80, 100% 2 L [place], CBG 102. Given 324 aspirin.

## 2015-10-09 NOTE — ED Notes (Signed)
Transported to 2H11 by Randall Hiss, RN.  Pt given apple juice, [place] sandwich, and crackers.  Attempted to order dinner tray and was instructed to have pt call and order once she gets to room.  Becky, NS updated pt about meal tray.

## 2015-10-09 NOTE — ED Notes (Signed)
Attempted report 
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
## 2015-10-09 NOTE — ED Notes (Signed)
Neurology consult was mistake per Dr. Ellender Hose, no swallow evaluation needed.
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
## 2015-10-09 NOTE — ED Notes (Signed)
Orders were changed to ICU bed. Pt updated.

## 2015-10-10 ENCOUNTER — Encounter (HOSPITAL_COMMUNITY)
Admission: EM | Disposition: A | Discharge status: Home / Self Care | Payer: Self-pay | Source: Home / Self Care | Attending: Cardiology

## 2015-10-10 ENCOUNTER — Encounter (HOSPITAL_COMMUNITY): Payer: Self-pay

## 2015-10-10 DIAGNOSIS — I248 Other forms of acute ischemic heart disease: Secondary | ICD-10-CM

## 2015-10-10 DIAGNOSIS — Z8673 Personal history of transient ischemic attack (TIA), and cerebral infarction without residual deficits: Secondary | ICD-10-CM

## 2015-10-10 DIAGNOSIS — Z7901 Long term (current) use of anticoagulants: Secondary | ICD-10-CM

## 2015-10-10 DIAGNOSIS — I208 Other forms of angina pectoris: Secondary | ICD-10-CM | POA: Diagnosis present

## 2015-10-10 HISTORY — PX: CARDIAC CATHETERIZATION: SHX172

## 2015-10-10 LAB — BASIC METABOLIC PANEL
Anion gap: 7 (ref 5–15)
BUN: 11 mg/dL (ref 6–20)
CO2: 25 mmol/L (ref 22–32)
Calcium: 9.1 mg/dL (ref 8.9–10.3)
Chloride: 106 mmol/L (ref 101–111)
Creatinine, Ser: 0.76 mg/dL (ref 0.44–1.00)
GFR calc Af Amer: >60 mL/min (ref >60)
GFR calc non Af Amer: >60 mL/min (ref >60)
Glucose, Bld: 113 mg/dL — ABNORMAL HIGH (ref 65–99)
Potassium: 4.1 mmol/L (ref 3.5–5.1)
Sodium: 138 mmol/L (ref 135–145)

## 2015-10-10 LAB — CBC
HEMATOCRIT: 38.9 % (ref 36.0–46.0)
Hemoglobin: 12.4 g/dL (ref 12.0–15.0)
MCH: 27.9 pg (ref 26.0–34.0)
MCHC: 31.9 g/dL (ref 30.0–36.0)
MCV: 87.4 fL (ref 78.0–100.0)
Platelets: 232 10*3/uL (ref 150–400)
RBC: 4.45 MIL/uL (ref 3.87–5.11)
RDW: 13.9 % (ref 11.5–15.5)
WBC: 7.7 10*3/uL (ref 4.0–10.5)

## 2015-10-10 LAB — APTT
aPTT: 31 seconds (ref 24–36)
aPTT: 64 seconds — ABNORMAL HIGH (ref 24–36)

## 2015-10-10 LAB — TROPONIN I
Troponin I: 0.04 ng/mL (ref <0.03)
Troponin I: 0.03 ng/mL (ref <0.03)

## 2015-10-10 LAB — HEPARIN LEVEL (UNFRACTIONATED): Heparin Unfractionated: 1.14 IU/mL — ABNORMAL HIGH (ref 0.30–0.70)

## 2015-10-10 LAB — PROTIME-INR
INR: 1.09
Prothrombin Time: 14.1 seconds (ref 11.4–15.2)

## 2015-10-10 SURGERY — LEFT HEART CATH AND CORONARY ANGIOGRAPHY

## 2015-10-10 MED ORDER — IOPAMIDOL (ISOVUE-370) INJECTION 76%
INTRAVENOUS | Status: DC | PRN
  Administered 2015-10-10: 70 mL via INTRA_ARTERIAL

## 2015-10-10 MED ORDER — SODIUM CHLORIDE 0.9 % WEIGHT BASED INFUSION
3.0000 mL/kg/h | INTRAVENOUS | Status: DC
Start: 2015-10-11 — End: 2015-10-10

## 2015-10-10 MED ORDER — ASPIRIN 81 MG PO CHEW: 81.0000 mg | CHEWABLE_TABLET | ORAL | Status: DC

## 2015-10-10 MED ORDER — ACETAMINOPHEN 325 MG PO TABS: 650.0000 mg | ORAL_TABLET | ORAL | Status: DC | PRN

## 2015-10-10 MED ORDER — SODIUM CHLORIDE 0.9 % WEIGHT BASED INFUSION
3.0000 mL/kg/h | INTRAVENOUS | Status: DC
  Administered 2015-10-10: 3 mL/kg/h via INTRAVENOUS

## 2015-10-10 MED ORDER — FENTANYL CITRATE (PF) 100 MCG/2ML IJ SOLN
INTRAMUSCULAR | Status: AC
  Filled 2015-10-10: qty 2

## 2015-10-10 MED ORDER — LIDOCAINE HCL (PF) 1 % IJ SOLN
INTRAMUSCULAR | Status: AC
  Filled 2015-10-10: qty 30

## 2015-10-10 MED ORDER — HEPARIN SODIUM (PORCINE) 1000 UNIT/ML IJ SOLN
INTRAMUSCULAR | Status: AC
  Filled 2015-10-10: qty 1

## 2015-10-10 MED ORDER — SODIUM CHLORIDE 0.9% FLUSH: 3.0000 mL | INTRAVENOUS | Status: DC | PRN

## 2015-10-10 MED ORDER — VERAPAMIL HCL 2.5 MG/ML IV SOLN
INTRAVENOUS | Status: DC | PRN
  Administered 2015-10-10: 16:00:00 via INTRA_ARTERIAL

## 2015-10-10 MED ORDER — VERAPAMIL HCL 2.5 MG/ML IV SOLN
INTRAVENOUS | Status: AC
  Filled 2015-10-10: qty 2

## 2015-10-10 MED ORDER — MIDAZOLAM HCL 2 MG/2ML IJ SOLN
INTRAMUSCULAR | Status: AC
  Filled 2015-10-10: qty 2

## 2015-10-10 MED ORDER — MIDAZOLAM HCL 2 MG/2ML IJ SOLN
INTRAMUSCULAR | Status: DC | PRN
  Administered 2015-10-10 (×2): 1 mg via INTRAVENOUS

## 2015-10-10 MED ORDER — SODIUM CHLORIDE 0.9 % IV SOLN: 250.0000 mL | INTRAVENOUS | Status: DC | PRN

## 2015-10-10 MED ORDER — LIDOCAINE HCL (PF) 1 % IJ SOLN
INTRAMUSCULAR | Status: DC | PRN
  Administered 2015-10-10: 2 mL

## 2015-10-10 MED ORDER — SODIUM CHLORIDE 0.9 % WEIGHT BASED INFUSION
1.0000 mL/kg/h | INTRAVENOUS | Status: DC
Start: 2015-10-11 — End: 2015-10-10

## 2015-10-10 MED ORDER — SODIUM CHLORIDE 0.9% FLUSH
3.0000 mL | Freq: Two times a day (BID) | INTRAVENOUS | Status: DC
  Administered 2015-10-10: 3 mL via INTRAVENOUS

## 2015-10-10 MED ORDER — ONDANSETRON HCL 4 MG/2ML IJ SOLN
4.0000 mg | Freq: Four times a day (QID) | INTRAMUSCULAR | Status: DC | PRN
  Administered 2015-10-10: 23:00:00 4 mg via INTRAVENOUS
  Filled 2015-10-10: qty 2

## 2015-10-10 MED ORDER — FENTANYL CITRATE (PF) 100 MCG/2ML IJ SOLN
INTRAMUSCULAR | Status: DC | PRN
  Administered 2015-10-10 (×2): 25 ug via INTRAVENOUS

## 2015-10-10 MED ORDER — ALPRAZOLAM 0.25 MG PO TABS
0.2500 mg | ORAL_TABLET | Freq: Three times a day (TID) | ORAL | Status: DC | PRN
  Administered 2015-10-10: 0.25 mg via ORAL
  Filled 2015-10-10: qty 1

## 2015-10-10 MED ORDER — HEPARIN (PORCINE) IN NACL 2-0.9 UNIT/ML-% IJ SOLN
INTRAMUSCULAR | Status: AC
  Filled 2015-10-10: qty 1000

## 2015-10-10 MED ORDER — HEPARIN SODIUM (PORCINE) 1000 UNIT/ML IJ SOLN
INTRAMUSCULAR | Status: DC | PRN
  Administered 2015-10-10: 5500 [IU] via INTRAVENOUS

## 2015-10-10 MED ORDER — SODIUM CHLORIDE 0.9% FLUSH: 3.0000 mL | Freq: Two times a day (BID) | INTRAVENOUS | Status: DC

## 2015-10-10 MED ORDER — APIXABAN 5 MG PO TABS
5.0000 mg | ORAL_TABLET | Freq: Two times a day (BID) | ORAL | Status: DC
  Administered 2015-10-10 – 2015-10-11 (×2): 5 mg via ORAL
  Filled 2015-10-10 (×2): qty 1

## 2015-10-10 MED ORDER — IOPAMIDOL (ISOVUE-370) INJECTION 76%
INTRAVENOUS | Status: AC
  Filled 2015-10-10: qty 100

## 2015-10-10 MED ORDER — HEPARIN (PORCINE) IN NACL 100-0.45 UNIT/ML-% IJ SOLN: 1300.0000 [IU]/h | INTRAMUSCULAR | Status: DC

## 2015-10-10 MED ORDER — SODIUM CHLORIDE 0.9 % WEIGHT BASED INFUSION: 1.0000 mL/kg/h | INTRAVENOUS | Status: DC

## 2015-10-10 MED ORDER — SODIUM CHLORIDE 0.9 % WEIGHT BASED INFUSION
3.0000 mL/kg/h | INTRAVENOUS | Status: AC
  Administered 2015-10-10: 17:00:00 3 mL/kg/h via INTRAVENOUS

## 2015-10-10 MED ORDER — HEPARIN (PORCINE) IN NACL 2-0.9 UNIT/ML-% IJ SOLN
INTRAMUSCULAR | Status: DC | PRN
  Administered 2015-10-10: 1000 mL

## 2015-10-10 SURGICAL SUPPLY — 10 items
CATH IMPULSE 5F ANG/FL3.5 (CATHETERS) ×3 IMPLANT
DEVICE RAD COMP TR BAND LRG (VASCULAR PRODUCTS) ×3 IMPLANT
GLIDESHEATH SLEND SS 6F .021 (SHEATH) ×2 IMPLANT
HOVERMATT SINGLE USE (MISCELLANEOUS) ×2 IMPLANT
KIT HEART LEFT (KITS) ×3 IMPLANT
PACK CARDIAC CATHETERIZATION (CUSTOM PROCEDURE TRAY) ×3 IMPLANT
SYR MEDRAD MARK V 150ML (SYRINGE) ×3 IMPLANT
TRANSDUCER W/STOPCOCK (MISCELLANEOUS) ×3 IMPLANT
TUBING CIL FLEX 10 FLL-RA (TUBING) ×3 IMPLANT
WIRE SAFE-T 1.5MM-J .035X260CM (WIRE) ×2 IMPLANT

## 2015-10-10 NOTE — H&P (View-Only) (Signed)
Patient Name: Renee [name] Date of Encounter: 10/10/2015  Primary Cardiologist: Dr. [name] in [facility] Problem List     Active Problems:   Paroxysmal atrial fibrillation with RVR ([place])     Subjective   Still having chest pain, heaviness, no SOB. Converted to NSR  Inpatient Medications    Scheduled Meds: . aspirin EC  81 mg Oral Daily  . atorvastatin  40 mg Oral q1800  . cholecalciferol  1,000 Units Oral Daily  . estrogens (conjugated)  0.625 mg Oral Daily  . ferrous sulfate  325 mg Oral Daily  . FLUoxetine  40 mg Oral Daily  . losartan  50 mg Oral Daily  . magnesium oxide  400 mg Oral Daily  . multivitamin with minerals  1 tablet Oral Daily  . pantoprazole  40 mg Oral Daily   Continuous Infusions: . heparin 1,250 Units/hr (10/10/15 0600)   PRN Meds:.albuterol, ALPRAZolam, pregabalin   Vital Signs    Vitals:   10/10/15 0600 10/10/15 0700 10/10/15 0800 10/10/15 0900  BP: (!) 115/52 129/62 130/66 136/72  Pulse: (!) 54 64 (!) 55 64  Resp: 17 20 18  (!) 22  Temp:      TempSrc:      SpO2: 95% 98% 96% 95%  Weight:      Height:        Intake/Output Summary (Last 24 hours) at 10/10/15 1018 Last data filed at 10/10/15 0900  Gross per 24 hour  Intake           1167.5 ml  Output             1200 ml  Net            -32.5 ml   Filed Weights   10/09/15 1345 10/09/15 1729  Weight: 235 lb (106.6 kg) 243 lb 6.2 oz (110.4 kg)    Physical Exam    GEN: Well nourished, well developed, in no acute distress.  HEENT: Grossly normal.  Neck: Supple, no JVD, carotid bruits, or masses. Cardiac: RRR, no murmurs, rubs, or gallops. No clubbing, cyanosis, edema.  Radials/DP/PT 2+ and equal bilaterally.  Respiratory:  Respirations regular and unlabored, clear to auscultation bilaterally. GI: Soft, nontender, nondistended, BS + x 4. obese MS: no deformity or atrophy. Skin: warm and dry, no rash. Neuro:  Strength and sensation are intact. Psych: AAOx3.  Normal  affect.  Labs    CBC  Recent Labs  10/09/15 1305  10/09/15 1847 10/10/15 0830  WBC 6.1  --  7.7 7.7  NEUTROABS 4.1  --   --   --   HGB 13.6  < > 13.0 12.4  HCT 42.1  < > 40.4 38.9  MCV 87.0  --  87.4 87.4  PLT 242  --  235 232  < > = values in this interval not displayed. Basic Metabolic Panel  Recent Labs  10/09/15 1305  10/09/15 1847 10/10/15 0525  NA 139  < > 138 138  K 3.6  < > 3.2* 4.1  CL 107  < > 106 106  CO2 22  --  22 25  GLUCOSE 121*  < > 151* 113*  BUN 10  < > 9 11  CREATININE 0.78  < > 0.84 0.76  CALCIUM 9.4  --  9.0 9.1  MG 1.7  --   --   --   < > = values in this interval not displayed. Liver Function Tests  Recent Labs  10/09/15 1305 10/09/15  1847  AST 27 26  ALT 19 19  ALKPHOS 68 65  BILITOT 0.8 0.6  PROT 7.0 6.5  ALBUMIN 3.7 3.4*   No results for input(s): LIPASE, AMYLASE in the last 72 hours. Cardiac Enzymes  Recent Labs  10/09/15 1847 10/10/15 0002 10/10/15 0525  TROPONINI 0.04* 0.04* 0.03*   Recent Labs  10/09/15 1847  TSH 2.515    Telemetry    PAF>>>now NSR - Personally Reviewed  ECG    AF - Personally Reviewed  Radiology    No results found.   Cardiac Studies  Echocardiogram: 06/2014 - Left ventricle: The cavity size was normal. Systolic function was vigorous. The estimated ejection fraction was in the range of 65% to 70%. Wall motion was normal; there were no regional wall motion abnormalities.  Impressions: - Normal study. There was no evidence of a vegetation.   Patient Profile     39 with PAF, CP, mildly elevated troponin, syncope. Now NSR  Assessment & Plan    Paroxysmal atrial fibrillation with RVR and syncopal episode  - Now back in NSR.  - Await ECHO  - On heparin IV  - Has tried amiodarone in the past - stopped because of weakness. No other anti-arrhythmic tried.   - Has been on Eliquis as well  - Has had multiple episodes of PAF managed by Dr. [name].   - Prior NUC low risk. If  cath normal could try Flecainide.   Chronic anticoagulation  - CHADS-VASC 4 (Stroke,Female + HTN)  - anticoagulation warranted, (Eliquis on hold for cath)  - No ischemia one year ago on stress test.   Demand ischemia  - mildly low level troponin elevation (0.04-0.03) in the setting of AFIB with RVR.   - Prior stress test normal, low risk.  - However, still having chest pain, heaviness. Will proceed with LHC. Risks of CVA, death, renal, bleeding discussed. Willing to proceed.   Chest pain - appears typical exertional with dizziness, she has very significant family history of premature coronary artery disease, -Cardiac cath (last one 10 years ago).  History of CVA  - thought to be secondary to AFIB.   - One year ago  Signed, Candee Furbish, MD  10/10/2015, 10:18 AM

## 2015-10-10 NOTE — Care Management Note (Signed)
Case Management Note  Patient Details  Name: Renee [name] MRN: [id number] Date of Birth: [birth year]  Subjective/Objective:  Patient is s/p heart cath, already on eliquis at home.  NCM will cont to follow for dc needs.                   Action/Plan:   Expected Discharge Date:                  Expected Discharge Plan:  Home/Self Care  In-House Referral:     Discharge planning Services  CM Consult  Post Acute Care Choice:    Choice offered to:     DME Arranged:    DME Agency:     HH Arranged:    HH Agency:     Status of Service:  In process, will continue to follow  If discussed at Long Length of Stay Meetings, dates discussed:    Additional Comments:  Zenon Mayo, RN 10/10/2015, 4:57 PM

## 2015-10-10 NOTE — Interval H&P Note (Signed)
History and Physical Interval Note:  10/10/2015 3:54 PM  Renee [name]  has presented today for surgery, with the diagnosis of cp  The various methods of treatment have been discussed with the patient and family. After consideration of risks, benefits and other options for treatment, the patient has consented to  Procedure(s): Left Heart Cath and Coronary Angiography (N/A) as a surgical intervention .  The patient's history has been reviewed, patient examined, no change in status, stable for surgery.  I have reviewed the patient's chart and labs.  Questions were answered to the patient's satisfaction.   Cath Lab Visit (complete for each Cath Lab visit)  Clinical Evaluation Leading to the Procedure:   ACS: Yes.    Non-ACS:    Anginal Classification: CCS III  Anti-ischemic medical therapy: Maximal Therapy (2 or more classes of medications)  Non-Invasive Test Results: No non-invasive testing performed  Prior CABG: No previous CABG        Collier [facility] 10/10/2015 3:54 PM

## 2015-10-10 NOTE — Progress Notes (Signed)
Patient Name: Renee [name] Date of Encounter: 10/10/2015  Primary Cardiologist: Dr. [name] in [facility] Problem List     Active Problems:   Paroxysmal atrial fibrillation with RVR ([place])     Subjective   Still having chest pain, heaviness, no SOB. Converted to NSR  Inpatient Medications    Scheduled Meds: . aspirin EC  81 mg Oral Daily  . atorvastatin  40 mg Oral q1800  . cholecalciferol  1,000 Units Oral Daily  . estrogens (conjugated)  0.625 mg Oral Daily  . ferrous sulfate  325 mg Oral Daily  . FLUoxetine  40 mg Oral Daily  . losartan  50 mg Oral Daily  . magnesium oxide  400 mg Oral Daily  . multivitamin with minerals  1 tablet Oral Daily  . pantoprazole  40 mg Oral Daily   Continuous Infusions: . heparin 1,250 Units/hr (10/10/15 0600)   PRN Meds:.albuterol, ALPRAZolam, pregabalin   Vital Signs    Vitals:   10/10/15 0600 10/10/15 0700 10/10/15 0800 10/10/15 0900  BP: (!) 115/52 129/62 130/66 136/72  Pulse: (!) 54 64 (!) 55 64  Resp: 17 20 18  (!) 22  Temp:      TempSrc:      SpO2: 95% 98% 96% 95%  Weight:      Height:        Intake/Output Summary (Last 24 hours) at 10/10/15 1018 Last data filed at 10/10/15 0900  Gross per 24 hour  Intake           1167.5 ml  Output             1200 ml  Net            -32.5 ml   Filed Weights   10/09/15 1345 10/09/15 1729  Weight: 235 lb (106.6 kg) 243 lb 6.2 oz (110.4 kg)    Physical Exam    GEN: Well nourished, well developed, in no acute distress.  HEENT: Grossly normal.  Neck: Supple, no JVD, carotid bruits, or masses. Cardiac: RRR, no murmurs, rubs, or gallops. No clubbing, cyanosis, edema.  Radials/DP/PT 2+ and equal bilaterally.  Respiratory:  Respirations regular and unlabored, clear to auscultation bilaterally. GI: Soft, nontender, nondistended, BS + x 4. obese MS: no deformity or atrophy. Skin: warm and dry, no rash. Neuro:  Strength and sensation are intact. Psych: AAOx3.  Normal  affect.  Labs    CBC  Recent Labs  10/09/15 1305  10/09/15 1847 10/10/15 0830  WBC 6.1  --  7.7 7.7  NEUTROABS 4.1  --   --   --   HGB 13.6  < > 13.0 12.4  HCT 42.1  < > 40.4 38.9  MCV 87.0  --  87.4 87.4  PLT 242  --  235 232  < > = values in this interval not displayed. Basic Metabolic Panel  Recent Labs  10/09/15 1305  10/09/15 1847 10/10/15 0525  NA 139  < > 138 138  K 3.6  < > 3.2* 4.1  CL 107  < > 106 106  CO2 22  --  22 25  GLUCOSE 121*  < > 151* 113*  BUN 10  < > 9 11  CREATININE 0.78  < > 0.84 0.76  CALCIUM 9.4  --  9.0 9.1  MG 1.7  --   --   --   < > = values in this interval not displayed. Liver Function Tests  Recent Labs  10/09/15 1305 10/09/15  1847  AST 27 26  ALT 19 19  ALKPHOS 68 65  BILITOT 0.8 0.6  PROT 7.0 6.5  ALBUMIN 3.7 3.4*   No results for input(s): LIPASE, AMYLASE in the last 72 hours. Cardiac Enzymes  Recent Labs  10/09/15 1847 10/10/15 0002 10/10/15 0525  TROPONINI 0.04* 0.04* 0.03*   Recent Labs  10/09/15 1847  TSH 2.515    Telemetry    PAF>>>now NSR - Personally Reviewed  ECG    AF - Personally Reviewed  Radiology    No results found.   Cardiac Studies  Echocardiogram: 06/2014 - Left ventricle: The cavity size was normal. Systolic function was vigorous. The estimated ejection fraction was in the range of 65% to 70%. Wall motion was normal; there were no regional wall motion abnormalities.  Impressions: - Normal study. There was no evidence of a vegetation.   Patient Profile     8 with PAF, CP, mildly elevated troponin, syncope. Now NSR  Assessment & Plan    Paroxysmal atrial fibrillation with RVR and syncopal episode  - Now back in NSR.  - Await ECHO  - On heparin IV  - Has tried amiodarone in the past - stopped because of weakness. No other anti-arrhythmic tried.   - Has been on Eliquis as well  - Has had multiple episodes of PAF managed by Dr. [name].   - Prior NUC low risk. If  cath normal could try Flecainide.   Chronic anticoagulation  - CHADS-VASC 4 (Stroke,Female + HTN)  - anticoagulation warranted, (Eliquis on hold for cath)  - No ischemia one year ago on stress test.   Demand ischemia  - mildly low level troponin elevation (0.04-0.03) in the setting of AFIB with RVR.   - Prior stress test normal, low risk.  - However, still having chest pain, heaviness. Will proceed with LHC. Risks of CVA, death, renal, bleeding discussed. Willing to proceed.   Chest pain - appears typical exertional with dizziness, she has very significant family history of premature coronary artery disease, -Cardiac cath (last one 10 years ago).  History of CVA  - thought to be secondary to AFIB.   - One year ago  Signed, Candee Furbish, MD  10/10/2015, 10:18 AM

## 2015-10-10 NOTE — Progress Notes (Signed)
ANTICOAGULATION CONSULT NOTE - Follow Up Consult  Pharmacy Consult for Heparin Indication: atrial fibrillation and chest pain/ACS  Allergies  Allergen Reactions  . 2,4-D Dimethylamine (Amisol) Nausea Only  . Ace Inhibitors Other (See Comments)    Cramps and cough  . Codeine Nausea Only    Other reaction(s): Hallucination  . Nsaids Other (See Comments)    dyspepsia  . Telmisartan     Other reaction(s): Other (See Comments)  . Prednisone Palpitations    tachycardia    Patient Measurements: Height: 5\' 8"  (172.7 cm) Weight: 243 lb 6.2 oz (110.4 kg) IBW/kg (Calculated) : 63.9 Heparin Dosing Weight:88kg  Vital Signs: Temp: 97.5 F (36.4 C) (09/18 0800) Temp Source: Oral (09/18 0800) BP: 136/72 (09/18 0900) Pulse Rate: 64 (09/18 0900)  Labs:  Recent Labs  10/09/15 1305 10/09/15 1334 10/09/15 1847 10/10/15 0002 10/10/15 0525 10/10/15 0830  HGB 13.6 14.3 13.0  --   --  12.4  HCT 42.1 42.0 40.4  --   --  38.9  PLT 242  --  235  --   --  232  APTT  --   --   --  31  --  64*  LABPROT  --   --   --  14.1  --   --   INR  --   --   --  1.09  --   --   HEPARINUNFRC  --   --   --   --   --  1.14*  CREATININE 0.78 0.70 0.84  --  0.76  --   TROPONINI  --   --  0.04* 0.04* 0.03*  --     Estimated Creatinine Clearance: 99.8 mL/min (by C-G formula based on SCr of 0.76 mg/dL).   Medications:  Heparin @ 1250 units/hr  Assessment: 60yof continues on IV heparin for afib, chest pain while apixaban on hold (last dose 9/17 @ 1430). Initial heparin level is elevated at 1.14 indicating apixaban still on board. Initial aPTT is slightly below goal at 64 seconds. CBC stable. No bleeding. Noted plan for cath.  Goal of Therapy:  APTT 66-102 seconds Heparin level 0.3-0.7 units/ml Monitor platelets by anticoagulation protocol: Yes   Plan:  1) Increase heparin to 1300 units/hr 2) Check 6 hour aPTT/heparin level versus follow up after cath  Deboraha Sprang 10/10/2015,11:02 AM

## 2015-10-11 ENCOUNTER — Encounter (HOSPITAL_COMMUNITY): Payer: Self-pay | Admitting: Cardiology

## 2015-10-11 DIAGNOSIS — Z7901 Long term (current) use of anticoagulants: Secondary | ICD-10-CM

## 2015-10-11 LAB — CBC
HCT: 38 % (ref 36.0–46.0)
Hemoglobin: 11.8 g/dL — ABNORMAL LOW (ref 12.0–15.0)
MCH: 27.4 pg (ref 26.0–34.0)
MCHC: 31.1 g/dL (ref 30.0–36.0)
MCV: 88.4 fL (ref 78.0–100.0)
PLATELETS: 237 10*3/uL (ref 150–400)
RBC: 4.3 MIL/uL (ref 3.87–5.11)
RDW: 14 % (ref 11.5–15.5)
WBC: 7.9 10*3/uL (ref 4.0–10.5)

## 2015-10-11 NOTE — Progress Notes (Signed)
Offered Pt a bath, Pt stated she will take care of bath. Tech gave Pt all bath supplies.

## 2015-10-11 NOTE — Progress Notes (Signed)
Patient Name: Renee [name] Date of Encounter: 10/11/2015  Primary Cardiologist: Dr. [name] in [facility] Problem List     Active Problems:   Paroxysmal atrial fibrillation with RVR [facility])   History of CVA (cerebrovascular accident)   Chronic anticoagulation   Angina decubitus ([facility])   Demand ischemia ([facility])     Subjective   No CP, no SOB.  Converted to NSR 9/17. Thankful.   Inpatient Medications    Scheduled Meds: . apixaban  5 mg Oral BID  . aspirin EC  81 mg Oral Daily  . atorvastatin  40 mg Oral q1800  . cholecalciferol  1,000 Units Oral Daily  . estrogens (conjugated)  0.625 mg Oral Daily  . ferrous sulfate  325 mg Oral Daily  . FLUoxetine  40 mg Oral Daily  . losartan  50 mg Oral Daily  . magnesium oxide  400 mg Oral Daily  . multivitamin with minerals  1 tablet Oral Daily  . pantoprazole  40 mg Oral Daily  . sodium chloride flush  3 mL Intravenous Q12H   Continuous Infusions:   PRN Meds:.sodium chloride, acetaminophen, albuterol, ALPRAZolam, ondansetron (ZOFRAN) IV, pregabalin, sodium chloride flush   Vital Signs    Vitals:   10/10/15 1900 10/10/15 1943 10/10/15 2000 10/11/15 0338  BP: (!) 133/103 (!) 134/59  (!) 130/39  Pulse: 68 (!) 59 (!) 57 (!) 57  Resp: 18 13 13 19   Temp:  97.5 F (36.4 C)  97.8 F (36.6 C)  TempSrc:  Oral  Oral  SpO2: 97% 98% 98% 93%  Weight:    242 lb 8.1 oz (110 kg)  Height:        Intake/Output Summary (Last 24 hours) at 10/11/15 0746 Last data filed at 10/10/15 2000  Gross per 24 hour  Intake          2000.25 ml  Output              850 ml  Net          1150.25 ml   Filed Weights   10/09/15 1345 10/09/15 1729 10/11/15 0338  Weight: 235 lb (106.6 kg) 243 lb 6.2 oz (110.4 kg) 242 lb 8.1 oz (110 kg)    Physical Exam    GEN: Well nourished, well developed, in no acute distress.  HEENT: Grossly normal.  Neck: Supple, no JVD, carotid bruits, or masses. Cardiac: brady reg, no murmurs, rubs, or gallops.  No clubbing, cyanosis, edema.  Radials/DP/PT 2+ and equal bilaterally. Cath site normal Respiratory:  Respirations regular and unlabored, clear to auscultation bilaterally. GI: Soft, nontender, nondistended, BS + x 4. obese MS: no deformity or atrophy. Skin: warm and dry, no rash. Neuro:  Strength and sensation are intact. Psych: AAOx3.  Normal affect.  Labs    CBC  Recent Labs  10/09/15 1305  10/10/15 0830 10/11/15 0335  WBC 6.1  < > 7.7 7.9  NEUTROABS 4.1  --   --   --   HGB 13.6  < > 12.4 11.8*  HCT 42.1  < > 38.9 38.0  MCV 87.0  < > 87.4 88.4  PLT 242  < > 232 237  < > = values in this interval not displayed. Basic Metabolic Panel  Recent Labs  10/09/15 1305  10/09/15 1847 10/10/15 0525  NA 139  < > 138 138  K 3.6  < > 3.2* 4.1  CL 107  < > 106 106  CO2 22  --  22  25  GLUCOSE 121*  < > 151* 113*  BUN 10  < > 9 11  CREATININE 0.78  < > 0.84 0.76  CALCIUM 9.4  --  9.0 9.1  MG 1.7  --   --   --   < > = values in this interval not displayed. Liver Function Tests  Recent Labs  10/09/15 1305 10/09/15 1847  AST 27 26  ALT 19 19  ALKPHOS 68 65  BILITOT 0.8 0.6  PROT 7.0 6.5  ALBUMIN 3.7 3.4*   No results for input(s): LIPASE, AMYLASE in the last 72 hours. Cardiac Enzymes  Recent Labs  10/09/15 1847 10/10/15 0002 10/10/15 0525  TROPONINI 0.04* 0.04* 0.03*    Recent Labs  10/09/15 1847  TSH 2.515    Telemetry    PAF>>>now NSR - Personally Reviewed  ECG    AF - Personally Reviewed  Radiology    No results found.   Cardiac Studies  Echocardiogram: 06/2014 - Left ventricle: The cavity size was normal. Systolic function was vigorous. The estimated ejection fraction was in the range of 65% to 70%. Wall motion was normal; there were no regional wall motion abnormalities.  Impressions: - Normal study. There was no evidence of a vegetation.   Patient Profile     89 with PAF, CP, mildly elevated troponin, syncope. Now  NSR  Assessment & Plan    Paroxysmal atrial fibrillation with RVR and syncopal episode  - Now back in NSR, sinus brady.  - Cath normal EF  - Has tried amiodarone in the past - stopped because of weakness. No other anti-arrhythmic tried.   - Has been on Eliquis as well  - Has had multiple episodes of PAF managed by Dr. [name].   - Cath reassuring - could potentially try Flecainide. Will defer to Dr. [name]. She mentioned ablation. Also potential. Consider EP evaluation in the future.   Chronic anticoagulation  - CHADS-VASC 4 (Stroke,Female + HTN)  - anticoagulation warranted Eliquis   - Cath reassuring. Back on Eliquis this AM.   - no ASA because of Eliquis.   Demand ischemia  - mildly low level troponin elevation (0.04-0.03) in the setting of AFIB with RVR.   - Prior stress test normal, low risk. Heart cath as below - reassuring.   Chest pain - reassuring Cardiac cath   Ost LAD to Prox LAD lesion, 15 %stenosed.  The left ventricular systolic function is normal.  LV end diastolic pressure is normal.  The left ventricular ejection fraction is 55-65% by visual estimate.   1. Minimal nonobstructive CAD 2. Normal LV function.    History of CVA  - thought to be secondary to AFIB.   - One year ago  OK for DC. Follow up with Dr. [name] in 2-4 weeks.   Signed, Candee Furbish, MD  10/11/2015, 7:46 AM

## 2015-10-11 NOTE — Care Management Note (Signed)
Case Management Note  Patient Details  Name: Renee [name] MRN: [id number] Date of Birth: Oct 22, 1955  Subjective/Objective:  Patient is s/p heart cath, already on eliquis at home. For dc today, no needs.                  Action/Plan:   Expected Discharge Date:                  Expected Discharge Plan:  Home/Self Care  In-House Referral:     Discharge planning Services  CM Consult  Post Acute Care Choice:    Choice offered to:     DME Arranged:    DME Agency:     HH Arranged:    HH Agency:     Status of Service:  Completed, signed off  If discussed at [employer] of Stay Meetings, dates discussed:    Additional Comments:  Zenon Mayo, RN 10/11/2015, 11:16 AM

## 2015-10-11 NOTE — Discharge Summary (Signed)
Discharge Summary    Patient ID: Renee [name],  MRN: [id number], DOB/AGE: 1955-06-23 60 y.o.  Admit date: 10/09/2015 Discharge date: 10/11/2015  Primary Care Provider: FITZGERALD, DAVID P Primary Cardiologist: Dr. [name] in [facility]  Discharge Diagnoses    Active Problems:   Paroxysmal atrial fibrillation with RVR ([name])   Syncope   History of CVA (cerebrovascular accident)   Chronic anticoagulation   Angina decubitus ([name])   Demand ischemia ([facility])   Allergies Allergies  Allergen Reactions  . 2,4-D Dimethylamine (Amisol) Nausea Only  . Ace Inhibitors Other (See Comments)    Cramps and cough  . Codeine Nausea Only    Other reaction(s): Hallucination  . Nsaids Other (See Comments)    dyspepsia  . Telmisartan     Other reaction(s): Other (See Comments)  . Prednisone Palpitations    tachycardia    Diagnostic Studies/Procedures   Left Heart Cath and Coronary Angiography 10/10/15  Conclusion     Ost LAD to Prox LAD lesion, 15 %stenosed.  The left ventricular systolic function is normal.  LV end diastolic pressure is normal.  The left ventricular ejection fraction is 55-65% by visual estimate.   1. Minimal nonobstructive CAD 2. Normal LV function.       History of Present Illness     This is a very pleasant 60 year old female with complicated prior history, she had on stroke in June 2016 when she was diagnosed with atrial fibrillation and started on Eliquis, amiodarone, aspirin and atorvastatin. She states that amiodarone was recently discontinued because it caused her profound fatigue. In the last year she has had several episodes of paroxysmal atrial fibrillation and has been followed by Dr. [name] and his nurse [name]. The patient has remaining leg weakness from her stroke as well as slurred speech and some mild memory impairment.  The patient states that she has very significant family history of premature coronary artery disease, specifically  her sister died of massive myocardial infarction at age of 66, her brother had aortic dissection age of 52 and that he survived, her mother had multiple myocardial infarctions in her early 75s followed by strokes and she has passed already.  The patient states that she has been noticing worsening fatigue, dyspnea and chest depression on exertion associated with dizziness and presyncope, she states that has been going on for several weeks. Her has been adds up that she seems to be much more tired than usual. She went to bed with chest pain 9/16 and woke up early 9/17 morning with significant palpitations associated with severe retrosternal chest pain, she called EMS, upon EMS arrival her heart rate was in 140s and increased to 190s at which point she passed out in front of EMS people. An adenosine was administered for heart rate management as well as Cardizem 2 and she was transferred to the hospital. she was given her dose of Eliquis was in the hospital. She currently has 3 out of 10 retrosternal pressure-like chest pain that is known radiating she denies any shortness of breath at rest, she feels significantly tired and continues having palpitations.  She has had cardiac catheterization over 10 years ago she doesn't remember results of it, she has had 3 stress tests in the last 10 years the last one approximately year ago that was negative for ischemia.  Hospital Course     Consultants: None  1. Paroxysmal atrial fibrillation with RVR and syncopal episode - patient was hypertensive however states that her blood pressure  tends to drop down with antihypertensive medication, thus started on esmolol drip for rate control. She was given a dose of Elliquis today in the ER, however held for possible cardiac catheterization and started on IV heparin. Converted to NSR with sinus brady. Cath showed normal EF. She has tried amiodarone in the past - stopped because of weakness. No other anti-arrhythmic tried. Has  had multiple episodes of PAF managed by Dr. [name]. Cath reassuring - could potentially try Flecainide. Will defer to Dr. [name]. She mentioned ablation. Also potential. Consider EP evaluation in the future. Continue Eliquis.  2. Chronic anticoagulation  - CHADS-VASC 4 (Stroke,Female + HTN). Anticoagulation warranted Eliquis. Cath reassuring. Back on Eliquis this AM. No ASA because of Eliquis.   3. Demand ischemia - Mildly low level troponin elevation (0.04-0.03) in the setting of AFIB with RVR. Prior stress test normal, low risk. Heart cath as below - reassuring.   4. Chest pain - reassuring Cardiac cath as above    The patient has been seen by Dr. [name] today and deemed ready for discharge home. All follow-up appointments have been scheduled. Discharge medications are listed below.    Discharge Vitals Blood pressure 124/66, pulse (!) 53, temperature 97.9 F (36.6 C), temperature source Oral, resp. rate (!) 21, height 5\' 8"  (1.727 m), weight 242 lb 8.1 oz (110 kg), SpO2 97 %.  Filed Weights   10/09/15 1345 10/09/15 1729 10/11/15 0338  Weight: 235 lb (106.6 kg) 243 lb 6.2 oz (110.4 kg) 242 lb 8.1 oz (110 kg)    Labs & Radiologic Studies     CBC  Recent Labs  10/09/15 1305  10/10/15 0830 10/11/15 0335  WBC 6.1  < > 7.7 7.9  NEUTROABS 4.1  --   --   --   HGB 13.6  < > 12.4 11.8*  HCT 42.1  < > 38.9 38.0  MCV 87.0  < > 87.4 88.4  PLT 242  < > 232 237  < > = values in this interval not displayed. Basic Metabolic Panel  Recent Labs  10/09/15 1305  10/09/15 1847 10/10/15 0525  NA 139  < > 138 138  K 3.6  < > 3.2* 4.1  CL 107  < > 106 106  CO2 22  --  22 25  GLUCOSE 121*  < > 151* 113*  BUN 10  < > 9 11  CREATININE 0.78  < > 0.84 0.76  CALCIUM 9.4  --  9.0 9.1  MG 1.7  --   --   --   < > = values in this interval not displayed. Liver Function Tests  Recent Labs  10/09/15 1305 10/09/15 1847  AST 27 26  ALT 19 19  ALKPHOS 68 65  BILITOT 0.8 0.6  PROT  7.0 6.5  ALBUMIN 3.7 3.4*   No results for input(s): LIPASE, AMYLASE in the last 72 hours. Cardiac Enzymes  Recent Labs  10/09/15 1847 10/10/15 0002 10/10/15 0525  TROPONINI 0.04* 0.04* 0.03*   BNP Invalid input(s): POCBNP D-Dimer No results for input(s): DDIMER in the last 72 hours. Hemoglobin A1C No results for input(s): HGBA1C in the last 72 hours. Fasting Lipid Panel No results for input(s): CHOL, HDL, LDLCALC, TRIG, CHOLHDL, LDLDIRECT in the last 72 hours. Thyroid Function Tests  Recent Labs  10/09/15 1847  TSH 2.515    No results found.  Disposition   Pt is being discharged home today in good condition.  Follow-up Plans & Appointments  Follow-up Information    Lamar Blinks, MD. Schedule an appointment as soon as possible for a visit in 2 week(s).   Specialty:  Cardiology Why:  for post hospital Contact information: [address] [facility] [place] [address] [phone]          Discharge Instructions    Diet - low sodium heart healthy    Complete by:  As directed    Discharge instructions    Complete by:  As directed    No driving for 48 hours. No lifting over 5 lbs for 1 week. No sexual activity for 1 week. You may return to work on 10/17/15. Keep procedure site clean & dry. If you notice increased pain, swelling, bleeding or pus, call/return!  You may shower, but no soaking baths/hot tubs/pools for 1 week.   Increase activity slowly    Complete by:  As directed       Discharge Medications   Current Discharge Medication List    CONTINUE these medications which have NOT CHANGED   Details  albuterol (PROVENTIL HFA;VENTOLIN HFA) 108 (90 Base) MCG/ACT inhaler Inhale 2 puffs into the lungs every 6 (six) hours as needed for wheezing or shortness of breath. Qty: 1 Inhaler, Refills: 2    ALPRAZolam (XANAX) 0.25 MG tablet Take 0.25 mg by mouth at bedtime as needed. Refills: 5    apixaban (ELIQUIS) 5 MG  TABS tablet Take 1 tablet (5 mg total) by mouth 2 (two) times daily. Qty: 60 tablet, Refills: 0    atorvastatin (LIPITOR) 40 MG tablet Take 1 tablet (40 mg total) by mouth daily at 6 PM. Qty: 30 tablet, Refills: 0    Cholecalciferol (VITAMIN D3) 1000 UNITS CAPS Take 1,000 Units by mouth daily.    ferrous sulfate 325 (65 FE) MG tablet Take 325 mg by mouth daily.    FLUoxetine (PROZAC) 20 MG tablet Take 40 mg by mouth daily.    HYDROcodone-acetaminophen (NORCO/VICODIN) 5-325 MG per tablet Take 1 tablet by mouth every 6 (six) hours as needed.    levalbuterol (XOPENEX HFA) 45 MCG/ACT inhaler Inhale 2 puffs into the lungs every 4 (four) hours as needed for wheezing.    LORazepam (ATIVAN) 1 MG tablet Take 1 tablet (1 mg total) by mouth 2 (two) times daily as needed for anxiety. Qty: 10 tablet, Refills: 0    losartan (COZAAR) 50 MG tablet Take 50 mg by mouth daily. Refills: 1    magnesium oxide (MAG-OX) 400 MG tablet Take 400 mg by mouth daily.    Multiple Vitamin (MULTI-VITAMINS) TABS Take 1 tablet by mouth daily.    omeprazole (PRILOSEC) 40 MG capsule Take 40 mg by mouth daily.    ondansetron (ZOFRAN) 4 MG tablet Take 4 mg by mouth every 8 (eight) hours as needed. Refills: 11    pregabalin (LYRICA) 50 MG capsule Take 50 mg by mouth at bedtime as needed (pain).    PREMARIN 0.625 MG tablet Take 0.625 mg by mouth daily. Refills: 11    zolpidem (AMBIEN) 5 MG tablet Take 5 mg by mouth daily as needed.      STOP taking these medications     aspirin EC 81 MG tablet           Outstanding Labs/Studies   None  Duration of Discharge Encounter   Greater than 30 minutes including physician time.  Signed, Bhagat,Bhavinkumar PA-C 10/11/2015, 8:27 AM  Personally seen and examined. Agree with above.   Patient Name: Renee [name]  Date of Encounter: 10/11/2015  Primary Cardiologist: Dr. [name] in [facility] Problem List     Active Problems:   Paroxysmal  atrial fibrillation with RVR ([place])   History of CVA (cerebrovascular accident)   Chronic anticoagulation   Angina decubitus (H[facility])   Demand ischemia ([facility])     Subjective   No CP, no SOB.  Converted to NSR 9/17. Thankful.   Inpatient Medications    Scheduled Meds: . apixaban  5 mg Oral BID  . aspirin EC  81 mg Oral Daily  . atorvastatin  40 mg Oral q1800  . cholecalciferol  1,000 Units Oral Daily  . estrogens (conjugated)  0.625 mg Oral Daily  . ferrous sulfate  325 mg Oral Daily  . FLUoxetine  40 mg Oral Daily  . losartan  50 mg Oral Daily  . magnesium oxide  400 mg Oral Daily  . multivitamin with minerals  1 tablet Oral Daily  . pantoprazole  40 mg Oral Daily  . sodium chloride flush  3 mL Intravenous Q12H   Continuous Infusions:   PRN Meds:.sodium chloride, acetaminophen, albuterol, ALPRAZolam, ondansetron (ZOFRAN) IV, pregabalin, sodium chloride flush   Vital Signs          Vitals:   10/10/15 1900 10/10/15 1943 10/10/15 2000 10/11/15 0338  BP: (!) 133/103 (!) 134/59  (!) 130/39  Pulse: 68 (!) 59 (!) 57 (!) 57  Resp: 18 13 13 19   Temp:  97.5 F (36.4 C)  97.8 F (36.6 C)  TempSrc:  Oral  Oral  SpO2: 97% 98% 98% 93%  Weight:    242 lb 8.1 oz (110 kg)  Height:        Intake/Output Summary (Last 24 hours) at 10/11/15 0746 Last data filed at 10/10/15 2000  Gross per 24 hour  Intake          2000.25 ml  Output              850 ml  Net          1150.25 ml        Filed Weights   10/09/15 1345 10/09/15 1729 10/11/15 0338  Weight: 235 lb (106.6 kg) 243 lb 6.2 oz (110.4 kg) 242 lb 8.1 oz (110 kg)    Physical Exam    GEN: Well nourished, well developed, in no acute distress.  HEENT: Grossly normal.  Neck: Supple, no JVD, carotid bruits, or masses. Cardiac: brady reg, no murmurs, rubs, or gallops. No clubbing, cyanosis, edema.  Radials/DP/PT 2+ and equal bilaterally. Cath site normal Respiratory:  Respirations regular and unlabored,  clear to auscultation bilaterally. GI: Soft, nontender, nondistended, BS + x 4. obese MS: no deformity or atrophy. Skin: warm and dry, no rash. Neuro:  Strength and sensation are intact. Psych: AAOx3.  Normal affect.  Labs    CBC  Recent Labs (last 2 labs)    Recent Labs  10/09/15 1305  10/10/15 0830 10/11/15 0335  WBC 6.1  < > 7.7 7.9  NEUTROABS 4.1  --   --   --   HGB 13.6  < > 12.4 11.8*  HCT 42.1  < > 38.9 38.0  MCV 87.0  < > 87.4 88.4  PLT 242  < > 232 237  < > = values in this interval not displayed.   Basic Metabolic Panel  Recent Labs (last 2 labs)    Recent Labs  10/09/15 1305  10/09/15 1847 10/10/15 0525  NA 139  < > 138  138  K 3.6  < > 3.2* 4.1  CL 107  < > 106 106  CO2 22  --  22 25  GLUCOSE 121*  < > 151* 113*  BUN 10  < > 9 11  CREATININE 0.78  < > 0.84 0.76  CALCIUM 9.4  --  9.0 9.1  MG 1.7  --   --   --   < > = values in this interval not displayed.   Liver Function Tests  Recent Labs (last 2 labs)    Recent Labs  10/09/15 1305 10/09/15 1847  AST 27 26  ALT 19 19  ALKPHOS 68 65  BILITOT 0.8 0.6  PROT 7.0 6.5  ALBUMIN 3.7 3.4*     Recent Labs (last 2 labs)   No results for input(s): LIPASE, AMYLASE in the last 72 hours.   Cardiac Enzymes  Recent Labs (last 2 labs)    Recent Labs  10/09/15 1847 10/10/15 0002 10/10/15 0525  TROPONINI 0.04* 0.04* 0.03*      Recent Labs (last 2 labs)    Recent Labs  10/09/15 1847  TSH 2.515      Telemetry    PAF>>>now NSR - Personally Reviewed  ECG    AF - Personally Reviewed  Radiology    Imaging Results (Last 48 hours)  No results found.     Cardiac Studies  Echocardiogram: 06/2014 - Left ventricle: The cavity size was normal. Systolic function was vigorous. The estimated ejection fraction was in the range of 65% to 70%. Wall motion was normal; there were no regional wall motion abnormalities.  Impressions: - Normal study. There was no  evidence of a vegetation.   Patient Profile     86 with PAF, CP, mildly elevated troponin, syncope. Now NSR  Assessment & Plan    Paroxysmal atrial fibrillation with RVR and syncopal episode  - Now back in NSR, sinus brady.  - Cath normal EF  - Has tried amiodarone in the past - stopped because of weakness. No other anti-arrhythmic tried.   - Has been on Eliquis as well  - Has had multiple episodes of PAF managed by Dr. [name].   - Cath reassuring - could potentially try Flecainide. Will defer to Dr. [name]. She mentioned ablation. Also potential. Consider EP evaluation in the future.   Chronic anticoagulation  - CHADS-VASC 4 (Stroke,Female + HTN)  - anticoagulation warranted Eliquis   - Cath reassuring. Back on Eliquis this AM.   - no ASA because of Eliquis.   Demand ischemia  - mildly low level troponin elevation (0.04-0.03) in the setting of AFIB with RVR.   - Prior stress test normal, low risk. Heart cath as below - reassuring.   Chest pain - reassuring Cardiac cath   Ost LAD to Prox LAD lesion, 15 %stenosed.  The left ventricular systolic function is normal.  LV end diastolic pressure is normal.  The left ventricular ejection fraction is 55-65% by visual estimate.  1. Minimal nonobstructive CAD 2. Normal LV function.    History of CVA  - thought to be secondary to AFIB.   - One year ago  OK for DC. Follow up with Dr. [name] in 2-4 weeks.   Signed, Candee Furbish, MD  10/11/2015, 7:46 AM

## 2015-10-11 NOTE — Progress Notes (Signed)
TR BAND REMOVAL  LOCATION:    right radial  DEFLATED PER PROTOCOL:    Yes.    TIME BAND OFF / DRESSING APPLIED:    19:30   SITE UPON ARRIVAL:    Level 0  SITE AFTER BAND REMOVAL:    Level 0  CIRCULATION SENSATION AND MOVEMENT:    Within Normal Limits   Yes.    COMMENTS:   Post TR band instructions given. Pt tolerated well. 

## 2015-10-17 ENCOUNTER — Other Ambulatory Visit: Payer: Self-pay | Admitting: Infectious Diseases

## 2015-10-17 DIAGNOSIS — Z1231 Encounter for screening mammogram for malignant neoplasm of breast: Secondary | ICD-10-CM

## 2015-11-11 ENCOUNTER — Ambulatory Visit: Payer: No Typology Code available for payment source

## 2015-12-27 ENCOUNTER — Ambulatory Visit
Admission: RE | Admit: 2015-12-27 | Discharge: 2015-12-27 | Disposition: A | Discharge status: Home / Self Care | Payer: Managed Care, Other (non HMO) | Source: Ambulatory Visit | Attending: Infectious Diseases | Admitting: Infectious Diseases

## 2015-12-27 DIAGNOSIS — Z1231 Encounter for screening mammogram for malignant neoplasm of breast: Secondary | ICD-10-CM | POA: Diagnosis present

## 2015-12-27 DIAGNOSIS — R928 Other abnormal and inconclusive findings on diagnostic imaging of breast: Secondary | ICD-10-CM | POA: Insufficient documentation

## 2015-12-29 ENCOUNTER — Other Ambulatory Visit: Payer: Self-pay | Admitting: Infectious Diseases

## 2015-12-29 DIAGNOSIS — R928 Other abnormal and inconclusive findings on diagnostic imaging of breast: Secondary | ICD-10-CM

## 2016-01-26 ENCOUNTER — Ambulatory Visit
Admission: RE | Admit: 2016-01-26 | Discharge: 2016-01-26 | Disposition: A | Discharge status: Home / Self Care | Payer: Managed Care, Other (non HMO) | Source: Ambulatory Visit | Attending: Infectious Diseases | Admitting: Infectious Diseases

## 2016-01-26 DIAGNOSIS — R928 Other abnormal and inconclusive findings on diagnostic imaging of breast: Secondary | ICD-10-CM

## 2016-01-26 DIAGNOSIS — N6001 Solitary cyst of right breast: Secondary | ICD-10-CM | POA: Diagnosis not present

## 2016-01-26 DIAGNOSIS — N6489 Other specified disorders of breast: Secondary | ICD-10-CM | POA: Insufficient documentation

## 2016-03-07 ENCOUNTER — Other Ambulatory Visit
Admission: RE | Admit: 2016-03-07 | Discharge: 2016-03-07 | Disposition: A | Discharge status: Home / Self Care | Payer: Managed Care, Other (non HMO) | Source: Ambulatory Visit | Attending: Nurse Practitioner | Admitting: Nurse Practitioner

## 2016-03-07 DIAGNOSIS — R197 Diarrhea, unspecified: Secondary | ICD-10-CM | POA: Insufficient documentation

## 2016-03-07 LAB — GASTROINTESTINAL PANEL BY PCR, STOOL (REPLACES STOOL CULTURE)

## 2016-03-07 LAB — C DIFFICILE QUICK SCREEN W PCR REFLEX
C Diff antigen: NEGATIVE
C Diff interpretation: NOT DETECTED
C Diff toxin: NEGATIVE

## 2016-03-21 ENCOUNTER — Other Ambulatory Visit: Payer: Self-pay | Admitting: Infectious Diseases

## 2016-03-21 DIAGNOSIS — N6001 Solitary cyst of right breast: Secondary | ICD-10-CM

## 2016-04-20 ENCOUNTER — Encounter: Payer: Self-pay | Admitting: *Deleted

## 2016-04-23 ENCOUNTER — Ambulatory Visit: Payer: Managed Care, Other (non HMO) | Admitting: Certified Registered Nurse Anesthetist

## 2016-04-23 ENCOUNTER — Encounter: Payer: Self-pay | Admitting: *Deleted

## 2016-04-23 ENCOUNTER — Encounter
Admission: RE | Disposition: A | Discharge status: Home / Self Care | Payer: Self-pay | Source: Ambulatory Visit | Attending: Unknown Physician Specialty

## 2016-04-23 ENCOUNTER — Ambulatory Visit
Admission: RE | Admit: 2016-04-23 | Discharge: 2016-04-23 | Disposition: A | Discharge status: Home / Self Care | Payer: Managed Care, Other (non HMO) | Source: Ambulatory Visit | Attending: Unknown Physician Specialty | Admitting: Unknown Physician Specialty

## 2016-04-23 DIAGNOSIS — I491 Atrial premature depolarization: Secondary | ICD-10-CM | POA: Diagnosis not present

## 2016-04-23 DIAGNOSIS — E785 Hyperlipidemia, unspecified: Secondary | ICD-10-CM | POA: Insufficient documentation

## 2016-04-23 DIAGNOSIS — G8929 Other chronic pain: Secondary | ICD-10-CM | POA: Insufficient documentation

## 2016-04-23 DIAGNOSIS — I10 Essential (primary) hypertension: Secondary | ICD-10-CM | POA: Insufficient documentation

## 2016-04-23 DIAGNOSIS — R1084 Generalized abdominal pain: Secondary | ICD-10-CM | POA: Diagnosis present

## 2016-04-23 DIAGNOSIS — Z8673 Personal history of transient ischemic attack (TIA), and cerebral infarction without residual deficits: Secondary | ICD-10-CM | POA: Diagnosis not present

## 2016-04-23 DIAGNOSIS — K3189 Other diseases of stomach and duodenum: Secondary | ICD-10-CM | POA: Insufficient documentation

## 2016-04-23 DIAGNOSIS — I480 Paroxysmal atrial fibrillation: Secondary | ICD-10-CM | POA: Diagnosis not present

## 2016-04-23 DIAGNOSIS — I471 Supraventricular tachycardia: Secondary | ICD-10-CM | POA: Diagnosis not present

## 2016-04-23 DIAGNOSIS — R001 Bradycardia, unspecified: Secondary | ICD-10-CM | POA: Diagnosis not present

## 2016-04-23 DIAGNOSIS — Z86010 Personal history of colonic polyps: Secondary | ICD-10-CM | POA: Insufficient documentation

## 2016-04-23 DIAGNOSIS — R112 Nausea with vomiting, unspecified: Secondary | ICD-10-CM | POA: Diagnosis not present

## 2016-04-23 DIAGNOSIS — F5104 Psychophysiologic insomnia: Secondary | ICD-10-CM | POA: Diagnosis not present

## 2016-04-23 DIAGNOSIS — I493 Ventricular premature depolarization: Secondary | ICD-10-CM | POA: Insufficient documentation

## 2016-04-23 DIAGNOSIS — Z888 Allergy status to other drugs, medicaments and biological substances status: Secondary | ICD-10-CM | POA: Diagnosis not present

## 2016-04-23 DIAGNOSIS — I2510 Atherosclerotic heart disease of native coronary artery without angina pectoris: Secondary | ICD-10-CM | POA: Insufficient documentation

## 2016-04-23 DIAGNOSIS — Z79899 Other long term (current) drug therapy: Secondary | ICD-10-CM | POA: Insufficient documentation

## 2016-04-23 DIAGNOSIS — Z885 Allergy status to narcotic agent status: Secondary | ICD-10-CM | POA: Diagnosis not present

## 2016-04-23 DIAGNOSIS — F329 Major depressive disorder, single episode, unspecified: Secondary | ICD-10-CM | POA: Insufficient documentation

## 2016-04-23 DIAGNOSIS — R1310 Dysphagia, unspecified: Secondary | ICD-10-CM | POA: Insufficient documentation

## 2016-04-23 DIAGNOSIS — M5442 Lumbago with sciatica, left side: Secondary | ICD-10-CM | POA: Diagnosis not present

## 2016-04-23 DIAGNOSIS — R42 Dizziness and giddiness: Secondary | ICD-10-CM | POA: Diagnosis not present

## 2016-04-23 DIAGNOSIS — J432 Centrilobular emphysema: Secondary | ICD-10-CM | POA: Diagnosis not present

## 2016-04-23 DIAGNOSIS — K449 Diaphragmatic hernia without obstruction or gangrene: Secondary | ICD-10-CM | POA: Insufficient documentation

## 2016-04-23 DIAGNOSIS — Z7901 Long term (current) use of anticoagulants: Secondary | ICD-10-CM | POA: Insufficient documentation

## 2016-04-23 DIAGNOSIS — I6523 Occlusion and stenosis of bilateral carotid arteries: Secondary | ICD-10-CM | POA: Insufficient documentation

## 2016-04-23 DIAGNOSIS — K219 Gastro-esophageal reflux disease without esophagitis: Secondary | ICD-10-CM | POA: Insufficient documentation

## 2016-04-23 DIAGNOSIS — Z87891 Personal history of nicotine dependence: Secondary | ICD-10-CM | POA: Insufficient documentation

## 2016-04-23 HISTORY — PX: SAVORY DILATION: SHX5439

## 2016-04-23 HISTORY — PX: ESOPHAGOGASTRODUODENOSCOPY (EGD) WITH PROPOFOL: SHX5813

## 2016-04-23 HISTORY — DX: Atherosclerotic heart disease of native coronary artery without angina pectoris: I25.10

## 2016-04-23 SURGERY — ESOPHAGOGASTRODUODENOSCOPY (EGD) WITH PROPOFOL
Anesthesia: General

## 2016-04-23 MED ORDER — PROPOFOL 500 MG/50ML IV EMUL
INTRAVENOUS | Status: DC | PRN
  Administered 2016-04-23: 150 ug/kg/min via INTRAVENOUS

## 2016-04-23 MED ORDER — PROPOFOL 10 MG/ML IV BOLUS
INTRAVENOUS | Status: AC
  Filled 2016-04-23: qty 20

## 2016-04-23 MED ORDER — SODIUM CHLORIDE 0.9 % IV SOLN: INTRAVENOUS | Status: DC

## 2016-04-23 MED ORDER — GLYCOPYRROLATE 0.2 MG/ML IJ SOLN
INTRAMUSCULAR | Status: DC | PRN
  Administered 2016-04-23: 0.2 mg via INTRAVENOUS

## 2016-04-23 MED ORDER — SODIUM CHLORIDE 0.9 % IV SOLN
INTRAVENOUS | Status: DC
  Administered 2016-04-23: 1000 mL via INTRAVENOUS

## 2016-04-23 MED ORDER — EPHEDRINE SULFATE 50 MG/ML IJ SOLN
INTRAMUSCULAR | Status: AC
  Filled 2016-04-23: qty 1

## 2016-04-23 MED ORDER — PROPOFOL 500 MG/50ML IV EMUL
INTRAVENOUS | Status: AC
  Filled 2016-04-23: qty 50

## 2016-04-23 MED ORDER — GLYCOPYRROLATE 0.2 MG/ML IJ SOLN
INTRAMUSCULAR | Status: AC
  Filled 2016-04-23: qty 1

## 2016-04-23 MED ORDER — SODIUM CHLORIDE 0.9 % IV SOLN
INTRAVENOUS | Status: DC | PRN
  Administered 2016-04-23: 13:00:00 via INTRAVENOUS

## 2016-04-23 MED ORDER — SODIUM CHLORIDE 0.9 % IJ SOLN
INTRAMUSCULAR | Status: AC
  Filled 2016-04-23: qty 10

## 2016-04-23 MED ORDER — PROPOFOL 10 MG/ML IV BOLUS
INTRAVENOUS | Status: DC | PRN
  Administered 2016-04-23: 70 mg via INTRAVENOUS
  Administered 2016-04-23: 30 mg via INTRAVENOUS

## 2016-04-23 NOTE — Anesthesia Preprocedure Evaluation (Addendum)
Anesthesia Evaluation  Patient identified by MRN, date of birth, ID band Patient awake    Reviewed: Allergy & Precautions, NPO status , Patient's Chart, lab work & pertinent test results, reviewed documented beta blocker date and time   Airway Mallampati: III  TM Distance: >3 FB     Dental  (+) Chipped, Partial Upper, Missing   Pulmonary COPD, former smoker,           Cardiovascular hypertension, Pt. on medications + angina + CAD       Neuro/Psych PSYCHIATRIC DISORDERS Depression TIA   GI/Hepatic   Endo/Other    Renal/GU      Musculoskeletal   Abdominal   Peds  Hematology   Anesthesia Other Findings   Reproductive/Obstetrics                           Anesthesia Physical Anesthesia Plan  ASA: III  Anesthesia Plan: General   Post-op Pain Management:    Induction: Intravenous  Airway Management Planned:   Additional Equipment:   Intra-op Plan:   Post-operative Plan:   Informed Consent: I have reviewed the patients History and Physical, chart, labs and discussed the procedure including the risks, benefits and alternatives for the proposed anesthesia with the patient or authorized representative who has indicated his/her understanding and acceptance.     Plan Discussed with: CRNA  Anesthesia Plan Comments:         Anesthesia Quick Evaluation

## 2016-04-23 NOTE — Op Note (Signed)
[facility] Gastroenterology Patient Name: Renee [name] Procedure Date: 04/23/2016 12:56 PM MRN: [id number] Account #: [id number] Date of Birth: Nov 12, 1955 Admit Type: Outpatient Age: 61 Room: [facility] ENDO ROOM 3 Gender: Female Note Status: Finalized Procedure:            Upper GI endoscopy Indications:          Epigastric abdominal pain, Generalized abdominal pain,                        Nausea with vomiting Providers:            Manya Silvas, MD Referring MD:         Adrian Prows (Referring MD) Medicines:            Propofol per Anesthesia Complications:        No immediate complications. Procedure:            Pre-Anesthesia Assessment:                       - After reviewing the risks and benefits, the patient                        was deemed in satisfactory condition to undergo the                        procedure.                       After obtaining informed consent, the endoscope was                        passed under direct vision. Throughout the procedure,                        the patient's blood pressure, pulse, and oxygen                        saturations were monitored continuously. The Endoscope                        was introduced through the mouth, and advanced to the                        efferent jejunal loop. Findings:      The examined esophagus was normal. Small hiatal hernia seen. No esoph       ring seen.      A small-medium amount of food (residue) was found in the gastric body.       Appears to be vegatable. Removal of food was accomplished. Used snare to       break it up and basket to remove it. The anatomy of the stomach is       unusual. One path leads to the antrum and duodenum with biopsy done of       oval mucosal prominence. Ampulla seen also. Another exit leads to the       jejunum which likely was due to previous obstruction.      Mucosa of stomach mildly erythematous.      No indication for dilatation  seen. Impression:           - Normal esophagus.                       -  A medium amount of food (residue) in the stomach.                        Removal was successful. Recommendation:       - The findings and recommendations were discussed with                        the patient's family. Manya Silvas, MD 04/23/2016 2:10:47 PM This report has been signed electronically. Number of Addenda: 0 Note Initiated On: 04/23/2016 12:56 PM      [facility]

## 2016-04-23 NOTE — Anesthesia Post-op Follow-up Note (Cosign Needed)
Anesthesia QCDR form completed.        
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
## 2016-04-23 NOTE — Anesthesia Postprocedure Evaluation (Signed)
Anesthesia Post Note  Patient: TARON CONREY  Procedure(s) Performed: Procedure(s) (LRB): ESOPHAGOGASTRODUODENOSCOPY (EGD) WITH PROPOFOL (N/A) SAVORY DILATION (N/A)  Patient location during evaluation: Endoscopy Anesthesia Type: General Level of consciousness: awake and alert Pain management: pain level controlled Vital Signs Assessment: post-procedure vital signs reviewed and stable Respiratory status: spontaneous breathing, nonlabored ventilation, respiratory function stable and patient connected to nasal cannula oxygen Cardiovascular status: blood pressure returned to baseline and stable Postop Assessment: no signs of nausea or vomiting Anesthetic complications: no     Last Vitals:  Vitals:   04/23/16 1430 04/23/16 1440  BP: 133/69 (!) 144/69  Pulse: (!) 59 61  Resp: 17 18  Temp:      Last Pain:  Vitals:   04/23/16 1410  TempSrc: Tympanic                 Ileanna Gemmill S

## 2016-04-23 NOTE — Transfer of Care (Signed)
Immediate Anesthesia Transfer of Care Note  Patient: Renee [name]  Procedure(s) Performed: Procedure(s): ESOPHAGOGASTRODUODENOSCOPY (EGD) WITH PROPOFOL (N/A) SAVORY DILATION (N/A)  Patient Location: PACU  Anesthesia Type:General  Level of Consciousness: awake, alert  and oriented  Airway & Oxygen Therapy: Patient Spontanous Breathing and Patient connected to nasal cannula oxygen  Post-op Assessment: Report given to RN and Post -op Vital signs reviewed and stable  Post vital signs: Reviewed and stable  Last Vitals:  Vitals:   04/23/16 1206 04/23/16 1410  BP: (!) 168/67 107/86  Pulse: (!) 57 64  Resp: 16 12  Temp: 36.3 C 36.6 C    Last Pain:  Vitals:   04/23/16 1410  TempSrc: Tympanic         Complications: No apparent anesthesia complications

## 2016-04-24 ENCOUNTER — Encounter: Payer: Self-pay | Admitting: Unknown Physician Specialty

## 2016-04-25 LAB — SURGICAL PATHOLOGY

## 2016-04-30 ENCOUNTER — Ambulatory Visit
Admission: RE | Admit: 2016-04-30 | Discharge: 2016-04-30 | Disposition: A | Discharge status: Home / Self Care | Payer: Managed Care, Other (non HMO) | Source: Ambulatory Visit | Attending: Infectious Diseases | Admitting: Infectious Diseases

## 2016-04-30 DIAGNOSIS — N6001 Solitary cyst of right breast: Secondary | ICD-10-CM | POA: Insufficient documentation

## 2016-06-18 ENCOUNTER — Inpatient Hospital Stay (HOSPITAL_COMMUNITY)
Admission: EM | Admit: 2016-06-18 | Discharge: 2016-06-23 | DRG: 310 | Disposition: A | Discharge status: Home / Self Care | Payer: Managed Care, Other (non HMO) | Attending: Internal Medicine | Admitting: Internal Medicine

## 2016-06-18 ENCOUNTER — Emergency Department (HOSPITAL_COMMUNITY): Payer: Managed Care, Other (non HMO)

## 2016-06-18 ENCOUNTER — Encounter (HOSPITAL_COMMUNITY): Payer: Self-pay | Admitting: Neurology

## 2016-06-18 DIAGNOSIS — Z8673 Personal history of transient ischemic attack (TIA), and cerebral infarction without residual deficits: Secondary | ICD-10-CM | POA: Diagnosis not present

## 2016-06-18 DIAGNOSIS — E669 Obesity, unspecified: Secondary | ICD-10-CM | POA: Diagnosis present

## 2016-06-18 DIAGNOSIS — I480 Paroxysmal atrial fibrillation: Secondary | ICD-10-CM | POA: Diagnosis not present

## 2016-06-18 DIAGNOSIS — Z6832 Body mass index (BMI) 32.0-32.9, adult: Secondary | ICD-10-CM

## 2016-06-18 DIAGNOSIS — Z7901 Long term (current) use of anticoagulants: Secondary | ICD-10-CM

## 2016-06-18 DIAGNOSIS — R079 Chest pain, unspecified: Secondary | ICD-10-CM | POA: Diagnosis present

## 2016-06-18 DIAGNOSIS — Z79899 Other long term (current) drug therapy: Secondary | ICD-10-CM

## 2016-06-18 DIAGNOSIS — R001 Bradycardia, unspecified: Secondary | ICD-10-CM | POA: Diagnosis present

## 2016-06-18 DIAGNOSIS — I4891 Unspecified atrial fibrillation: Secondary | ICD-10-CM | POA: Diagnosis not present

## 2016-06-18 DIAGNOSIS — E785 Hyperlipidemia, unspecified: Secondary | ICD-10-CM | POA: Diagnosis present

## 2016-06-18 DIAGNOSIS — I4581 Long QT syndrome: Secondary | ICD-10-CM | POA: Diagnosis present

## 2016-06-18 DIAGNOSIS — Z7982 Long term (current) use of aspirin: Secondary | ICD-10-CM | POA: Diagnosis not present

## 2016-06-18 DIAGNOSIS — I10 Essential (primary) hypertension: Secondary | ICD-10-CM | POA: Diagnosis present

## 2016-06-18 DIAGNOSIS — R072 Precordial pain: Secondary | ICD-10-CM

## 2016-06-18 DIAGNOSIS — Z79890 Hormone replacement therapy (postmenopausal): Secondary | ICD-10-CM | POA: Diagnosis not present

## 2016-06-18 DIAGNOSIS — K219 Gastro-esophageal reflux disease without esophagitis: Secondary | ICD-10-CM | POA: Diagnosis present

## 2016-06-18 DIAGNOSIS — I495 Sick sinus syndrome: Secondary | ICD-10-CM | POA: Diagnosis not present

## 2016-06-18 DIAGNOSIS — Z8249 Family history of ischemic heart disease and other diseases of the circulatory system: Secondary | ICD-10-CM

## 2016-06-18 DIAGNOSIS — Z87891 Personal history of nicotine dependence: Secondary | ICD-10-CM

## 2016-06-18 HISTORY — DX: Cerebral infarction, unspecified: I63.9

## 2016-06-18 HISTORY — DX: Malignant neoplasm of vulva, unspecified: C51.9

## 2016-06-18 HISTORY — DX: Unspecified osteoarthritis, unspecified site: M19.90

## 2016-06-18 HISTORY — DX: Anxiety disorder, unspecified: F41.9

## 2016-06-18 HISTORY — DX: Emphysema, unspecified: J43.9

## 2016-06-18 LAB — TROPONIN I: Troponin I: 0.08 ng/mL (ref <0.03)

## 2016-06-18 LAB — APTT: APTT: 32 s (ref 24–36)

## 2016-06-18 LAB — BASIC METABOLIC PANEL
ANION GAP: 10 (ref 5–15)
BUN: 8 mg/dL (ref 6–20)
CALCIUM: 8.9 mg/dL (ref 8.9–10.3)
CO2: 19 mmol/L — ABNORMAL LOW (ref 22–32)
Chloride: 111 mmol/L (ref 101–111)
Creatinine, Ser: 0.66 mg/dL (ref 0.44–1.00)
GFR calc Af Amer: >60 mL/min (ref >60)
Glucose, Bld: 108 mg/dL — ABNORMAL HIGH (ref 65–99)
POTASSIUM: 3.7 mmol/L (ref 3.5–5.1)
SODIUM: 140 mmol/L (ref 135–145)

## 2016-06-18 LAB — CBC
HEMATOCRIT: 41.1 % (ref 36.0–46.0)
HEMOGLOBIN: 13.7 g/dL (ref 12.0–15.0)
MCH: 28.5 pg (ref 26.0–34.0)
MCHC: 33.3 g/dL (ref 30.0–36.0)
MCV: 85.4 fL (ref 78.0–100.0)
Platelets: 244 10*3/uL (ref 150–400)
RBC: 4.81 MIL/uL (ref 3.87–5.11)
RDW: 13.9 % (ref 11.5–15.5)
WBC: 6.4 10*3/uL (ref 4.0–10.5)

## 2016-06-18 LAB — I-STAT TROPONIN, ED: Troponin i, poc: 0.01 ng/mL (ref 0.00–0.08)

## 2016-06-18 LAB — HEPARIN LEVEL (UNFRACTIONATED): Heparin Unfractionated: 1.68 IU/mL — ABNORMAL HIGH (ref 0.30–0.70)

## 2016-06-18 MED ORDER — DOCUSATE SODIUM 100 MG PO CAPS
100.0000 mg | ORAL_CAPSULE | Freq: Every evening | ORAL | Status: DC
  Administered 2016-06-18 – 2016-06-22 (×5): 100 mg via ORAL
  Filled 2016-06-18 (×5): qty 1

## 2016-06-18 MED ORDER — NITROGLYCERIN 2 % TD OINT
1.0000 [in_us] | TOPICAL_OINTMENT | Freq: Once | TRANSDERMAL | Status: DC
  Filled 2016-06-18: qty 1

## 2016-06-18 MED ORDER — LORATADINE 10 MG PO TABS
10.0000 mg | ORAL_TABLET | Freq: Every evening | ORAL | Status: DC
  Administered 2016-06-18 – 2016-06-22 (×5): 10 mg via ORAL
  Filled 2016-06-18 (×5): qty 1

## 2016-06-18 MED ORDER — HEPARIN BOLUS VIA INFUSION
4000.0000 [IU] | Freq: Once | INTRAVENOUS | Status: AC
  Administered 2016-06-18: 4000 [IU] via INTRAVENOUS
  Filled 2016-06-18: qty 4000

## 2016-06-18 MED ORDER — SODIUM CHLORIDE 0.9 % IV SOLN: 250.0000 mL | INTRAVENOUS | Status: DC | PRN

## 2016-06-18 MED ORDER — MAGNESIUM OXIDE 400 (241.3 MG) MG PO TABS
200.0000 mg | ORAL_TABLET | Freq: Every day | ORAL | Status: DC
  Administered 2016-06-18 – 2016-06-20 (×3): 200 mg via ORAL
  Filled 2016-06-18 (×3): qty 1

## 2016-06-18 MED ORDER — SODIUM CHLORIDE 0.9% FLUSH: 3.0000 mL | INTRAVENOUS | Status: DC | PRN

## 2016-06-18 MED ORDER — ATORVASTATIN CALCIUM 40 MG PO TABS
40.0000 mg | ORAL_TABLET | Freq: Every day | ORAL | Status: DC
  Administered 2016-06-18 – 2016-06-22 (×5): 40 mg via ORAL
  Filled 2016-06-18 (×5): qty 1

## 2016-06-18 MED ORDER — METOPROLOL TARTRATE 5 MG/5ML IV SOLN
5.0000 mg | Freq: Once | INTRAVENOUS | Status: AC
  Administered 2016-06-18: 5 mg via INTRAVENOUS
  Filled 2016-06-18: qty 5

## 2016-06-18 MED ORDER — SODIUM CHLORIDE 0.9 % IV BOLUS (SEPSIS)
500.0000 mL | Freq: Once | INTRAVENOUS | Status: AC
  Administered 2016-06-18: 500 mL via INTRAVENOUS

## 2016-06-18 MED ORDER — ENSURE ENLIVE PO LIQD
237.0000 mL | Freq: Two times a day (BID) | ORAL | Status: DC
  Administered 2016-06-19 – 2016-06-21 (×4): 237 mL via ORAL

## 2016-06-18 MED ORDER — LEVALBUTEROL TARTRATE 45 MCG/ACT IN AERO: 2.0000 | INHALATION_SPRAY | RESPIRATORY_TRACT | Status: DC | PRN

## 2016-06-18 MED ORDER — ALPRAZOLAM 0.25 MG PO TABS
0.2500 mg | ORAL_TABLET | Freq: Three times a day (TID) | ORAL | Status: DC | PRN
  Administered 2016-06-18 – 2016-06-22 (×3): 0.25 mg via ORAL
  Filled 2016-06-18 (×3): qty 1

## 2016-06-18 MED ORDER — TRAZODONE HCL 100 MG PO TABS
100.0000 mg | ORAL_TABLET | Freq: Every evening | ORAL | Status: DC | PRN
  Administered 2016-06-21 – 2016-06-22 (×2): 100 mg via ORAL
  Filled 2016-06-18 (×2): qty 1

## 2016-06-18 MED ORDER — LOSARTAN POTASSIUM 50 MG PO TABS
50.0000 mg | ORAL_TABLET | Freq: Every day | ORAL | Status: DC
  Administered 2016-06-19 – 2016-06-23 (×5): 50 mg via ORAL
  Filled 2016-06-18 (×5): qty 1

## 2016-06-18 MED ORDER — ASPIRIN-ACETAMINOPHEN-CAFFEINE 250-250-65 MG PO TABS
1.0000 | ORAL_TABLET | Freq: Four times a day (QID) | ORAL | Status: DC | PRN
  Administered 2016-06-19 – 2016-06-22 (×4): 1 via ORAL
  Filled 2016-06-18 (×9): qty 1

## 2016-06-18 MED ORDER — FLUOXETINE HCL 20 MG PO CAPS
60.0000 mg | ORAL_CAPSULE | Freq: Every day | ORAL | Status: DC
  Administered 2016-06-19 – 2016-06-23 (×5): 60 mg via ORAL
  Filled 2016-06-18 (×5): qty 3

## 2016-06-18 MED ORDER — SODIUM CHLORIDE 0.9% FLUSH
3.0000 mL | Freq: Two times a day (BID) | INTRAVENOUS | Status: DC
  Administered 2016-06-18 – 2016-06-23 (×6): 3 mL via INTRAVENOUS

## 2016-06-18 MED ORDER — ALBUTEROL SULFATE (2.5 MG/3ML) 0.083% IN NEBU: 2.5000 mg | INHALATION_SOLUTION | RESPIRATORY_TRACT | Status: DC | PRN

## 2016-06-18 MED ORDER — ESTROGENS CONJUGATED 0.625 MG PO TABS
0.6250 mg | ORAL_TABLET | Freq: Every day | ORAL | Status: DC
  Administered 2016-06-19 – 2016-06-23 (×5): 0.625 mg via ORAL
  Filled 2016-06-18 (×5): qty 1

## 2016-06-18 MED ORDER — ONDANSETRON HCL 4 MG PO TABS
4.0000 mg | ORAL_TABLET | Freq: Three times a day (TID) | ORAL | Status: DC | PRN
  Administered 2016-06-18: 4 mg via ORAL
  Filled 2016-06-18: qty 1

## 2016-06-18 MED ORDER — VITAMIN D 1000 UNITS PO TABS
1000.0000 [IU] | ORAL_TABLET | Freq: Every day | ORAL | Status: DC
  Administered 2016-06-18 – 2016-06-23 (×6): 1000 [IU] via ORAL
  Filled 2016-06-18 (×6): qty 1

## 2016-06-18 MED ORDER — FERROUS SULFATE 325 (65 FE) MG PO TABS
325.0000 mg | ORAL_TABLET | Freq: Every day | ORAL | Status: DC
  Administered 2016-06-19 – 2016-06-23 (×5): 325 mg via ORAL
  Filled 2016-06-18 (×5): qty 1

## 2016-06-18 MED ORDER — PANTOPRAZOLE SODIUM 40 MG PO TBEC
40.0000 mg | DELAYED_RELEASE_TABLET | Freq: Two times a day (BID) | ORAL | Status: DC
  Administered 2016-06-19 – 2016-06-23 (×8): 40 mg via ORAL
  Filled 2016-06-18 (×8): qty 1

## 2016-06-18 MED ORDER — HEPARIN (PORCINE) IN NACL 100-0.45 UNIT/ML-% IJ SOLN
1300.0000 [IU]/h | INTRAMUSCULAR | Status: DC
  Administered 2016-06-18: 1300 [IU]/h via INTRAVENOUS
  Filled 2016-06-18: qty 250
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
## 2016-06-18 NOTE — ED Triage Notes (Signed)
Per ems- pt comes from home c/o weakness, AFIB. 1 week ago her problems started after working in the yard. She has afib intermittently. This morning she felt more weak and like her heart was racing. AFIB RVR 120-180. Feels SOB, cp. Given 1 nitro, 800 NS. Is a x 4.

## 2016-06-18 NOTE — ED Notes (Signed)
MD states okay for patient to eat/drink. Provided patient with [place] sandwich and sprite.
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
## 2016-06-18 NOTE — ED Notes (Addendum)
Giving iv metoprolol, only gave 1 mg, HR noted to drop to 46. Stopped. MD to bedside. ekg obtained. Pads and zoll placed on pt.
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
## 2016-06-18 NOTE — Progress Notes (Signed)
Patient has arrived on unit from ED. Patient oriented to unit, assessed, placed on tele, VS were stable. Defib pads still on pt.

## 2016-06-19 ENCOUNTER — Encounter (HOSPITAL_COMMUNITY)
Admission: EM | Disposition: A | Discharge status: Home / Self Care | Payer: Self-pay | Source: Home / Self Care | Attending: Internal Medicine

## 2016-06-19 LAB — HEPARIN LEVEL (UNFRACTIONATED): HEPARIN UNFRACTIONATED: 1.46 [IU]/mL — AB (ref 0.30–0.70)

## 2016-06-19 LAB — HIV ANTIBODY (ROUTINE TESTING W REFLEX): HIV Screen 4th Generation wRfx: NONREACTIVE

## 2016-06-19 LAB — BASIC METABOLIC PANEL
Anion gap: 6 (ref 5–15)
BUN: 11 mg/dL (ref 6–20)
CHLORIDE: 107 mmol/L (ref 101–111)
CO2: 24 mmol/L (ref 22–32)
CREATININE: 0.77 mg/dL (ref 0.44–1.00)
Calcium: 8.5 mg/dL — ABNORMAL LOW (ref 8.9–10.3)
GFR calc Af Amer: >60 mL/min (ref >60)
GFR calc non Af Amer: >60 mL/min (ref >60)
GLUCOSE: 108 mg/dL — AB (ref 65–99)
POTASSIUM: 4.3 mmol/L (ref 3.5–5.1)
SODIUM: 137 mmol/L (ref 135–145)

## 2016-06-19 LAB — CBC
HEMATOCRIT: 36 % (ref 36.0–46.0)
Hemoglobin: 11.3 g/dL — ABNORMAL LOW (ref 12.0–15.0)
MCH: 27.8 pg (ref 26.0–34.0)
MCHC: 31.4 g/dL (ref 30.0–36.0)
MCV: 88.7 fL (ref 78.0–100.0)
Platelets: 232 10*3/uL (ref 150–400)
RBC: 4.06 MIL/uL (ref 3.87–5.11)
RDW: 14.4 % (ref 11.5–15.5)
WBC: 6.8 10*3/uL (ref 4.0–10.5)

## 2016-06-19 LAB — APTT
aPTT: 101 seconds — ABNORMAL HIGH (ref 24–36)
aPTT: 99 seconds — ABNORMAL HIGH (ref 24–36)

## 2016-06-19 LAB — MAGNESIUM: MAGNESIUM: 1.9 mg/dL (ref 1.7–2.4)

## 2016-06-19 LAB — TROPONIN I: Troponin I: 0.06 ng/mL (ref <0.03)

## 2016-06-19 LAB — TSH: TSH: 2.512 u[IU]/mL (ref 0.350–4.500)

## 2016-06-19 SURGERY — PACEMAKER IMPLANT

## 2016-06-19 MED ORDER — SODIUM CHLORIDE 0.9 % IV SOLN
250.0000 mL | INTRAVENOUS | Status: DC | PRN
Start: 2016-06-19 — End: 2016-06-23

## 2016-06-19 MED ORDER — APIXABAN 5 MG PO TABS
5.0000 mg | ORAL_TABLET | Freq: Two times a day (BID) | ORAL | Status: DC
  Administered 2016-06-19 – 2016-06-23 (×9): 5 mg via ORAL
  Filled 2016-06-19 (×9): qty 1

## 2016-06-19 MED ORDER — CEFAZOLIN SODIUM-DEXTROSE 2-4 GM/100ML-% IV SOLN
INTRAVENOUS | Status: AC
  Filled 2016-06-19: qty 100

## 2016-06-19 MED ORDER — SODIUM CHLORIDE 0.9% FLUSH: 3.0000 mL | INTRAVENOUS | Status: DC | PRN

## 2016-06-19 MED ORDER — DOFETILIDE 500 MCG PO CAPS
500.0000 ug | ORAL_CAPSULE | Freq: Two times a day (BID) | ORAL | Status: DC
  Filled 2016-06-19: qty 1

## 2016-06-19 MED ORDER — SODIUM CHLORIDE 0.9% FLUSH
3.0000 mL | Freq: Two times a day (BID) | INTRAVENOUS | Status: DC
  Administered 2016-06-19 – 2016-06-22 (×6): 3 mL via INTRAVENOUS

## 2016-06-19 MED ORDER — SODIUM CHLORIDE 0.9 % IR SOLN
Status: AC
  Filled 2016-06-19: qty 2

## 2016-06-19 MED ORDER — DOFETILIDE 500 MCG PO CAPS
500.0000 ug | ORAL_CAPSULE | Freq: Two times a day (BID) | ORAL | Status: DC
  Administered 2016-06-19 (×2): 500 ug via ORAL
  Filled 2016-06-19: qty 1

## 2016-06-19 NOTE — Progress Notes (Addendum)
Initial Nutrition Assessment  DOCUMENTATION CODES:   Obesity unspecified  INTERVENTION:    Continue Ensure Enlive po BID, each supplement provides 350 kcal and 20 grams of protein  NUTRITION DIAGNOSIS:   Inadequate oral intake related to poor appetite as evidenced by per patient/family report   GOAL:   Patient will meet greater than or equal to 90% of their needs  MONITOR:   PO intake, Supplement acceptance, Labs, Weight trends, I & O's  REASON FOR ASSESSMENT:   Malnutrition Screening Tool  ASSESSMENT:   61 y.o. Female with a past medical history significant for stroke in 2016 and diagnosed with new onset a-fib - started on Eliquis and amiodarone. She was intolerant of amiodarone and it was discontinued due to fatigue. She has also had bradycardia with HR's in the 50's  Pt reports a decreased appetite for the past several months.  Typically "grazes" throughout the day.  Sometimes snacks on "junk food". Shares her family has been under a lot of stress since 12/10/2015 after the death of her sister and nephew.  Also reveals she's lost about 30 lbs (12%) since 10-Dec-2022.  Significant for time frame.  Pt is interested in trying an oral nutrition supplement.  Ensure Enlive orders in place. Medications reviewed and include Mag-Ox, ferrous sulfate and Vitamin D. Labs reviewed.  aPPT 99 (H).  Troponin I 0.06 (H).  Unable to complete Nutrition-Focused physical exam at this time.   Diet Order:  Diet Heart Room service appropriate? Yes; Fluid consistency: Thin  Skin:  Reviewed, no issues  Last BM:  5/28  Height:   Ht Readings from Last 1 Encounters:  06/18/16 5\' 10"  (1.778 m)   Weight:   Wt Readings from Last 1 Encounters:  06/18/16 227 lb (103 kg)   Ideal Body Weight:  68.1 kg  BMI:  Body mass index is 32.57 kg/m.  Estimated Nutritional Needs:   Kcal:  1900-2100  Protein:  95-105 gm  Fluid:  1.9-2.1 L  EDUCATION NEEDS:   No education needs identified at  this time  Arthur Holms, RD, LDN Pager #: [phone] After-Hours Pager #: [phone]
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
## 2016-06-19 NOTE — Care Management Note (Signed)
Case Management Note  Patient Details  Name: MARKITA STCHARLES MRN: [id number] Date of Birth: [birth year]  Subjective/Objective:  From home, presents with new onset afib with RVR, chest pain , started on eliquis and amiodarone, did not tolerate amiodarone and it was dc'd. She has had bradycardia has hx of CVA, chronic anticoagulation.  Has [employer]. EP consulted.  NCM awaiting tikosyn benefit check.   PCP listed as Adrian Prows                Action/Plan: NCM will follow for dc needs.  Expected Discharge Date:                  Expected Discharge Plan:  Home/Self Care  In-House Referral:     Discharge planning Services  CM Consult  Post Acute Care Choice:    Choice offered to:     DME Arranged:    DME Agency:     HH Arranged:    HH Agency:     Status of Service:  In process, will continue to follow  If discussed at Long Length of Stay Meetings, dates discussed:    Additional Comments:  Zenon Mayo, RN 06/19/2016, 12:54 PM
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
## 2016-06-19 NOTE — Progress Notes (Signed)
Pharmacy Review for Dofetilide (Tikosyn) Initiation  Admit Complaint: 61 y.o. female admitted 06/18/2016 with atrial fibrillation to be initiated on dofetilide.   Assessment:  Patient Exclusion Criteria: If any screening criteria checked as "Yes", then  patient  should NOT receive dofetilide until criteria item is corrected. If "Yes" please indicate correction plan.  YES  NO Patient  Exclusion Criteria Correction Plan  []  [x]  Baseline QTc interval is greater than or equal to 440 msec. IF above YES box checked dofetilide contraindicated unless patient has ICD; then may proceed if QTc 500-550 msec or with known ventricular conduction abnormalities may proceed with QTc 550-600 msec. QTc = 0.44  QTc was 369 on 5/28 by 12 lead   []  [x]  Magnesium level is less than 1.8 mEq/l : Last magnesium:  1.9 Lab Results  Component Value Date   MG 1.9 06/19/2016         []  [x]  Potassium level is less than 4 mEq/l : Last potassium: 4.3 Lab Results  Component Value Date   K 4.3 06/19/2016         []  [x]  Patient is known or suspected to have a digoxin level greater than 2 ng/ml: No results found for: DIGOXIN    []  [x]  Creatinine clearance less than 20 ml/min (calculated using Cockcroft-Gault, actual body weight and serum creatinine): Estimated Creatinine Clearance: 98.4 mL/min (by C-G formula based on SCr of 0.77 mg/dL).    []  [x]  Patient has received drugs known to prolong the QT intervals within the last 48 hours (phenothiazines, tricyclics or tetracyclic antidepressants, erythromycin, H-1 antihistamines, cisapride, fluoroquinolones, azithromycin). Drugs not listed above may have an, as yet, undetected potential to prolong the QT interval, updated information on QT prolonging agents is available at this website:QT prolonging agents   []  [x]  Patient received a dose of hydrochlorothiazide (Oretic) alone or in any combination including triamterene (Dyazide, Maxzide) in the last 48 hours.   []  [x]   Patient received a medication known to increase dofetilide plasma concentrations prior to initial dofetilide dose:  . Trimethoprim (Primsol, Proloprim) in the last 36 hours . Verapamil (Calan, Verelan) in the last 36 hours or a sustained release dose in the last 72 hours . Megestrol (Megace) in the last 5 days  . Cimetidine (Tagamet) in the last 6 hours . Ketoconazole (Nizoral) in the last 24 hours . Itraconazole (Sporanox) in the last 48 hours  . Prochlorperazine (Compazine) in the last 36 hours    []  [x]  Patient is known to have a history of torsades de pointes; congenital or acquired long QT syndromes.   []  []  Patient has received a Class 1 antiarrhythmic with less than 2 half-lives since last dose. (Disopyramide, Quinidine, Procainamide, Lidocaine, Mexiletine, Flecainide, Propafenone)   []  [x]  Patient has received amiodarone therapy in the past 3 months or amiodarone level is greater than 0.3 ng/ml.    Patient has been appropriately anticoagulated with apixiban.  Ordering provider was confirmed at [email or web address] if they are not listed on the [place] Prescribers list.  Goal of Therapy: Follow renal function, electrolytes, potential drug interactions, and dose adjustment. Provide education and 1 week supply at discharge.  Plan:  [x]   Physician selected initial dose within range recommended for patients level of renal function - will monitor for response.  []   Physician selected initial dose outside of range recommended for patients level of renal function - will discuss if the dose should be altered at this time.   [facility] One Calculated CrCl  Dose  q12h  [x]  > 60 ml/min 500 mcg  []  40-60 ml/min 250 mcg  []  20-40 ml/min 125 mcg   2. Follow up QTc after the first 5 doses, renal function, electrolytes (K & Mg) daily x 3     days, dose adjustment, success of initiation and facilitate 1 week discharge supply as     clinically indicated.  3. Initiate Tikosyn education video  (Call [phone] and ask for Tikosyn Video # 116).  4. Place Enrollment Form on the chart for discharge supply of dofetilide.  Hildred Laser, Pharm D 06/19/2016 11:21 AM

## 2016-06-19 NOTE — Progress Notes (Signed)
#   2 .  S/W LAQUESHIA  @ [employer] RX # [phone]   1.TIKOSYN  500 MG BID  COVER- YES  CO-PAY- 20 % OF TOTAL OCAST  TIER- 3 DRUG  PRIOR APPROVAL- NO   2 TIKOSYN 250 MG  COVER- YES  CO-PAY- 20 % OF TOTAL COAST  TIER- 3 DRUG  PRIOR APPROVAL- NO   3. TIKOSYN 125 MG  COVER- YES  CO-PAY- 20 % OF TOTAL COAST  TIER- 3 DRUG  PRIOR APPROVAL- NO   4 DOFETELIDE 500 MG , 250MG  AND 125 MG BID  COVER- YES  CO-PAY- 10 % OF TOTAL COAST  TIER- 1 DRUG  PRIOR APPROVAL- NO   PHARMACY : [facility] AND [facility]

## 2016-06-20 ENCOUNTER — Inpatient Hospital Stay (HOSPITAL_COMMUNITY): Payer: Managed Care, Other (non HMO)

## 2016-06-20 DIAGNOSIS — I4891 Unspecified atrial fibrillation: Secondary | ICD-10-CM

## 2016-06-20 LAB — BASIC METABOLIC PANEL
Anion gap: 7 (ref 5–15)
BUN: 10 mg/dL (ref 6–20)
CHLORIDE: 105 mmol/L (ref 101–111)
CO2: 26 mmol/L (ref 22–32)
Calcium: 8.6 mg/dL — ABNORMAL LOW (ref 8.9–10.3)
Creatinine, Ser: 0.73 mg/dL (ref 0.44–1.00)
GFR calc Af Amer: >60 mL/min (ref >60)
GFR calc non Af Amer: >60 mL/min (ref >60)
GLUCOSE: 100 mg/dL — AB (ref 65–99)
Potassium: 3.9 mmol/L (ref 3.5–5.1)
SODIUM: 138 mmol/L (ref 135–145)

## 2016-06-20 LAB — MAGNESIUM: Magnesium: 2 mg/dL (ref 1.7–2.4)

## 2016-06-20 LAB — CBC
HEMATOCRIT: 37.3 % (ref 36.0–46.0)
HEMOGLOBIN: 11.5 g/dL — AB (ref 12.0–15.0)
MCH: 27.3 pg (ref 26.0–34.0)
MCHC: 30.8 g/dL (ref 30.0–36.0)
MCV: 88.6 fL (ref 78.0–100.0)
Platelets: 231 10*3/uL (ref 150–400)
RBC: 4.21 MIL/uL (ref 3.87–5.11)
RDW: 14.2 % (ref 11.5–15.5)
WBC: 6.2 10*3/uL (ref 4.0–10.5)

## 2016-06-20 LAB — ECHOCARDIOGRAM COMPLETE
Height: 70 in
Weight: 3632 oz

## 2016-06-20 MED ORDER — DOFETILIDE 500 MCG PO CAPS: 500.0000 ug | ORAL_CAPSULE | Freq: Two times a day (BID) | ORAL | Status: DC

## 2016-06-20 MED ORDER — POTASSIUM CHLORIDE CRYS ER 20 MEQ PO TBCR
30.0000 meq | EXTENDED_RELEASE_TABLET | Freq: Once | ORAL | Status: AC
  Administered 2016-06-20: 30 meq via ORAL
  Filled 2016-06-20: qty 1

## 2016-06-20 MED ORDER — DOFETILIDE 500 MCG PO CAPS
500.0000 ug | ORAL_CAPSULE | Freq: Two times a day (BID) | ORAL | Status: DC
  Administered 2016-06-20 (×2): 500 ug via ORAL
  Filled 2016-06-20 (×2): qty 1

## 2016-06-20 NOTE — Progress Notes (Signed)
Tikosyn will be 20% of total cost of $112. $22.40.

## 2016-06-20 NOTE — Progress Notes (Signed)
SUBJECTIVE: The patient is doing well today.   Mild dizziness overnight, now resolved.  At this time, she denies chest pain, shortness of breath, or any new concerns.  Marland Kitchen apixaban  5 mg Oral BID  . atorvastatin  40 mg Oral q1800  . cholecalciferol  1,000 Units Oral Daily  . docusate sodium  100 mg Oral QPM  . dofetilide  500 mcg Oral Q12H  . estrogens (conjugated)  0.625 mg Oral Daily  . feeding supplement (ENSURE ENLIVE)  237 mL Oral BID BM  . ferrous sulfate  325 mg Oral Daily  . FLUoxetine  60 mg Oral Daily  . loratadine  10 mg Oral QPM  . losartan  50 mg Oral Daily  . magnesium oxide  200 mg Oral Daily  . nitroGLYCERIN  1 inch Topical Once  . pantoprazole  40 mg Oral BID AC  . sodium chloride flush  3 mL Intravenous Q12H  . sodium chloride flush  3 mL Intravenous Q12H   . sodium chloride    . sodium chloride      OBJECTIVE: Physical Exam: Vitals:   06/19/16 0500 06/19/16 1330 06/19/16 2044 06/20/16 0423  BP: (!) 128/56 133/67 (!) 126/54 (!) 126/52  Pulse: (!) 52 (!) 49 (!) 48 (!) 49  Resp: 18 18 18 18   Temp: 98 F (36.7 C) 97.9 F (36.6 C) 98.4 F (36.9 C) 97.4 F (36.3 C)  TempSrc: Oral Oral Oral Oral  SpO2: 100% 95% 99% 97%  Weight:      Height:        Intake/Output Summary (Last 24 hours) at 06/20/16 0843 Last data filed at 06/19/16 2045  Gross per 24 hour  Intake              840 ml  Output                0 ml  Net              840 ml    Telemetry reveals sinus rhythm with nonsustained atach  GEN- The patient is well appearing, alert and oriented x 3 today.   Head- normocephalic, atraumatic Eyes-  Sclera clear, conjunctiva pink Ears- hearing intact Oropharynx- clear Neck- supple, no JVP Lymph- no cervical lymphadenopathy Lungs- Clear to ausculation bilaterally, normal work of breathing Heart- Regular rate and rhythm, no murmurs, rubs or gallops, PMI not laterally displaced GI- soft, NT, ND, + BS Extremities- no clubbing, cyanosis, or  edema Skin- no rash or lesion Psych- euthymic mood, full affect Neuro- strength and sensation are intact  LABS: Basic Metabolic Panel:  Recent Labs  06/19/16 0007 06/20/16 0217  NA 137 138  K 4.3 3.9  CL 107 105  CO2 24 26  GLUCOSE 108* 100*  BUN 11 10  CREATININE 0.77 0.73  CALCIUM 8.5* 8.6*  MG 1.9 2.0   Liver Function Tests: No results for input(s): AST, ALT, ALKPHOS, BILITOT, PROT, ALBUMIN in the last 72 hours. No results for input(s): LIPASE, AMYLASE in the last 72 hours. CBC:  Recent Labs  06/19/16 0007 06/20/16 0217  WBC 6.8 6.2  HGB 11.3* 11.5*  HCT 36.0 37.3  MCV 88.7 88.6  PLT 232 231   Cardiac Enzymes:  Recent Labs  06/18/16 1850 06/19/16 0012  TROPONINI 0.08* 0.06*  Thyroid Function Tests:  Recent Labs  06/19/16 0012  TSH 2.512    ASSESSMENT AND PLAN:   1. afib Currently being loaded with tikosyn QTc is 470 msec On  eliquis Echo is pending  2. Sinus bradycardia Stable Hoping to avoid pacing  3. HTN Stable No change required today  No changes today Anticipate discharge on Friday  Thompson Grayer MD, [facility] 06/20/2016 8:44 AM

## 2016-06-20 NOTE — Progress Notes (Signed)
MD notified of Pt currently bradycardia with c/o of dizziness and nauseous. Verbal orders given to continue to given night time dose of Tikosyn. No other new orders at this time. Will continue to monitor. Isac Caddy, RN

## 2016-06-21 LAB — BASIC METABOLIC PANEL
ANION GAP: 6 (ref 5–15)
BUN: 10 mg/dL (ref 6–20)
CALCIUM: 8.9 mg/dL (ref 8.9–10.3)
CO2: 28 mmol/L (ref 22–32)
Chloride: 102 mmol/L (ref 101–111)
Creatinine, Ser: 0.82 mg/dL (ref 0.44–1.00)
Glucose, Bld: 108 mg/dL — ABNORMAL HIGH (ref 65–99)
Potassium: 3.6 mmol/L (ref 3.5–5.1)
SODIUM: 136 mmol/L (ref 135–145)

## 2016-06-21 LAB — MAGNESIUM: MAGNESIUM: 1.9 mg/dL (ref 1.7–2.4)

## 2016-06-21 MED ORDER — DOFETILIDE 500 MCG PO CAPS
500.0000 ug | ORAL_CAPSULE | Freq: Two times a day (BID) | ORAL | Status: DC
  Administered 2016-06-21 (×2): 500 ug via ORAL
  Filled 2016-06-21 (×2): qty 1

## 2016-06-21 MED ORDER — POTASSIUM CHLORIDE CRYS ER 20 MEQ PO TBCR
40.0000 meq | EXTENDED_RELEASE_TABLET | Freq: Two times a day (BID) | ORAL | Status: AC
  Administered 2016-06-21 (×2): 40 meq via ORAL
  Filled 2016-06-21 (×2): qty 2

## 2016-06-21 MED ORDER — MAGNESIUM OXIDE 400 (241.3 MG) MG PO TABS
200.0000 mg | ORAL_TABLET | Freq: Two times a day (BID) | ORAL | Status: DC
  Administered 2016-06-21 – 2016-06-23 (×5): 200 mg via ORAL
  Filled 2016-06-21 (×5): qty 1

## 2016-06-21 NOTE — Progress Notes (Addendum)
Patient states pharmacy of choice is [address] in [place]. Staff confirmed they have approx 60 of DOFETELIDE (generic for Tikosyn) 558mcg and 250 mcg in stock.

## 2016-06-21 NOTE — Progress Notes (Signed)
SUBJECTIVE: The patient is doing well today.  At this time, she denies chest pain, shortness of breath, or any new concerns.  Marland Kitchen apixaban  5 mg Oral BID  . atorvastatin  40 mg Oral q1800  . cholecalciferol  1,000 Units Oral Daily  . docusate sodium  100 mg Oral QPM  . dofetilide  500 mcg Oral Q12H  . estrogens (conjugated)  0.625 mg Oral Daily  . feeding supplement (ENSURE ENLIVE)  237 mL Oral BID BM  . ferrous sulfate  325 mg Oral Daily  . FLUoxetine  60 mg Oral Daily  . loratadine  10 mg Oral QPM  . losartan  50 mg Oral Daily  . magnesium oxide  200 mg Oral BID  . nitroGLYCERIN  1 inch Topical Once  . pantoprazole  40 mg Oral BID AC  . potassium chloride  40 mEq Oral BID  . sodium chloride flush  3 mL Intravenous Q12H  . sodium chloride flush  3 mL Intravenous Q12H   . sodium chloride    . sodium chloride      OBJECTIVE: Physical Exam: Vitals:   06/20/16 1200 06/20/16 1300 06/20/16 2154 06/21/16 0528  BP:  (!) 146/72 135/77 (!) 148/52  Pulse:  (!) 55 (!) 53 (!) 51  Resp:  18 18 18   Temp:  97.7 F (36.5 C) 98 F (36.7 C) 97.6 F (36.4 C)  TempSrc:  Oral Oral Oral  SpO2: 99%  99% 100%  Weight:      Height:        Intake/Output Summary (Last 24 hours) at 06/21/16 0749 Last data filed at 06/20/16 2156  Gross per 24 hour  Intake              720 ml  Output                0 ml  Net              720 ml    Telemetry reveals sinus bradycardia  GEN- The patient is well appearing, alert and oriented x 3 today.   Head- normocephalic, atraumatic Eyes-  Sclera clear, conjunctiva pink Ears- hearing intact Oropharynx- clear Neck- supple,   Lungs- Clear to ausculation bilaterally, normal work of breathing Heart- bradycardic regular rhythm, no murmurs, rubs or gallops, PMI not laterally displaced GI- soft, NT, ND, + BS Extremities- no clubbing, cyanosis, or edema Skin- no rash or lesion Psych- euthymic mood, full affect Neuro- strength and sensation are  intact  LABS: Basic Metabolic Panel:  Recent Labs  06/20/16 0217 06/21/16 0210  NA 138 136  K 3.9 3.6  CL 105 102  CO2 26 28  GLUCOSE 100* 108*  BUN 10 10  CREATININE 0.73 0.82  CALCIUM 8.6* 8.9  MG 2.0 1.9   Liver Function Tests: No results for input(s): AST, ALT, ALKPHOS, BILITOT, PROT, ALBUMIN in the last 72 hours. No results for input(s): LIPASE, AMYLASE in the last 72 hours. CBC:  Recent Labs  06/19/16 0007 06/20/16 0217  WBC 6.8 6.2  HGB 11.3* 11.5*  HCT 36.0 37.3  MCV 88.7 88.6  PLT 232 231   Cardiac Enzymes:  Recent Labs  06/18/16 1850 06/19/16 0012  TROPONINI 0.08* 0.06*  echo- reviewed with patient   ASSESSMENT AND PLAN:   1. afib  Doing well with tikosyn QTc 450 msec On eliquis  2. Sinus bradycardia Stable No change required today  3. HTN Stable No change required today  Thompson Grayer,  MD 06/21/2016 7:49 AM

## 2016-06-22 DIAGNOSIS — I495 Sick sinus syndrome: Secondary | ICD-10-CM

## 2016-06-22 DIAGNOSIS — I10 Essential (primary) hypertension: Secondary | ICD-10-CM

## 2016-06-22 LAB — BASIC METABOLIC PANEL
ANION GAP: 9 (ref 5–15)
BUN: 12 mg/dL (ref 6–20)
CHLORIDE: 103 mmol/L (ref 101–111)
CO2: 25 mmol/L (ref 22–32)
CREATININE: 0.67 mg/dL (ref 0.44–1.00)
Calcium: 8.9 mg/dL (ref 8.9–10.3)
GFR calc non Af Amer: >60 mL/min (ref >60)
Glucose, Bld: 106 mg/dL — ABNORMAL HIGH (ref 65–99)
POTASSIUM: 3.9 mmol/L (ref 3.5–5.1)
SODIUM: 137 mmol/L (ref 135–145)

## 2016-06-22 LAB — MAGNESIUM: Magnesium: 2.1 mg/dL (ref 1.7–2.4)

## 2016-06-22 MED ORDER — DOFETILIDE 250 MCG PO CAPS
250.0000 ug | ORAL_CAPSULE | Freq: Two times a day (BID) | ORAL | Status: DC
  Administered 2016-06-22 – 2016-06-23 (×2): 250 ug via ORAL
  Filled 2016-06-22 (×2): qty 1

## 2016-06-22 MED ORDER — POTASSIUM CHLORIDE CRYS ER 20 MEQ PO TBCR
30.0000 meq | EXTENDED_RELEASE_TABLET | Freq: Once | ORAL | Status: AC
  Administered 2016-06-22: 30 meq via ORAL
  Filled 2016-06-22: qty 1

## 2016-06-22 NOTE — Progress Notes (Signed)
SUBJECTIVE: The patient is doing well today.  At this time, she denies chest pain, shortness of breath, or any new concerns.  Marland Kitchen apixaban  5 mg Oral BID  . atorvastatin  40 mg Oral q1800  . cholecalciferol  1,000 Units Oral Daily  . docusate sodium  100 mg Oral QPM  . dofetilide  250 mcg Oral Q12H  . estrogens (conjugated)  0.625 mg Oral Daily  . feeding supplement (ENSURE ENLIVE)  237 mL Oral BID BM  . ferrous sulfate  325 mg Oral Daily  . FLUoxetine  60 mg Oral Daily  . loratadine  10 mg Oral QPM  . losartan  50 mg Oral Daily  . magnesium oxide  200 mg Oral BID  . nitroGLYCERIN  1 inch Topical Once  . pantoprazole  40 mg Oral BID AC  . sodium chloride flush  3 mL Intravenous Q12H  . sodium chloride flush  3 mL Intravenous Q12H   . sodium chloride    . sodium chloride      OBJECTIVE: Physical Exam: Vitals:   06/21/16 0528 06/21/16 1323 06/21/16 2036 06/22/16 0406  BP: (!) 148/52 (!) 130/55 131/70 (!) 109/54  Pulse: (!) 51 66 (!) 51 (!) 51  Resp: 18 20 18 16   Temp: 97.6 F (36.4 C) 98.3 F (36.8 C) 97.9 F (36.6 C) 98.1 F (36.7 C)  TempSrc: Oral Oral Oral Oral  SpO2: 100% 99% 97% 96%  Weight:      Height:        Intake/Output Summary (Last 24 hours) at 06/22/16 1009 Last data filed at 06/22/16 0800  Gross per 24 hour  Intake              720 ml  Output                0 ml  Net              720 ml    Telemetry reveals sinus rhythm  GEN- The patient is well appearing, alert and oriented x 3 today.   Head- normocephalic, atraumatic Eyes-  Sclera clear, conjunctiva pink Ears- hearing intact Oropharynx- clear Neck- supple, no JVP Lymph- no cervical lymphadenopathy Lungs- Clear to ausculation bilaterally, normal work of breathing Heart- Regular rate and rhythm, no murmurs, rubs or gallops, PMI not laterally displaced GI- soft, NT, ND, + BS Extremities- no clubbing, cyanosis, or edema Skin- no rash or lesion Psych- euthymic mood, full affect Neuro-  strength and sensation are intact  LABS: Basic Metabolic Panel:  Recent Labs  06/21/16 0210 06/22/16 0347  NA 136 137  K 3.6 3.9  CL 102 103  CO2 28 25  GLUCOSE 108* 106*  BUN 10 12  CREATININE 0.82 0.67  CALCIUM 8.9 8.9  MG 1.9 2.1   Liver Function Tests: No results for input(s): AST, ALT, ALKPHOS, BILITOT, PROT, ALBUMIN in the last 72 hours. No results for input(s): LIPASE, AMYLASE in the last 72 hours. CBC:  Recent Labs  06/20/16 0217  WBC 6.2  HGB 11.5*  HCT 37.3  MCV 88.6  PLT 231  RADIOLOGY: Dg Chest Portable 1 View  Result Date: 06/18/2016 CLINICAL DATA:  Weakness.  Atrial fibrillation. EXAM: PORTABLE CHEST 1 VIEW COMPARISON:  None. FINDINGS: 1229 hour. Mildly lordotic positioning. The heart size and mediastinal contours are normal. There is mild atherosclerosis of the aortic arch. The lungs are clear. There is no pleural effusion or pneumothorax. No acute osseous findings are seen. There is  a mild thoracic scoliosis. Telemetry leads overlie the chest. IMPRESSION: No active cardiopulmonary process. Electronically Signed   By: Richardean Sale M.D.   On: 06/18/2016 12:44    ASSESSMENT AND PLAN:    1. afib QT unfortunately has lengthened Will hold tikosyn this am Resume tikosyn at lower dose 250 mcg BID tonight if QT allows Ultimately, may benefit from ablation Continue on eliquis  2. Sinus bradycardia Stable No change required today  3. HTN Stable No change required today  Plan to discharge tomorrow afternoon or Sunday if QT is improved. She is willing to stay if required.  We had a long discussion today about her qt prolongation.    She will follow-up with Butch Penny in the AF clinic in 1 week and I will see in 4 weeks.  Very complicated patient.  A high level of decision making was required for this encounter.  Thompson Grayer MD, [facility] 06/22/2016 10:11 AM      Thompson Grayer, MD 06/22/2016 10:09 AM

## 2016-06-22 NOTE — Progress Notes (Signed)
EKG this afternoon is reviewed with Dr. Lovena Le, QTc has improved significant.y and will proceed with reduced dose (244mcg) Tikosyn this evening. SB 43bpm, QTc 419 by EKG, I measure corrected QT 378ms Patient is currently ambulating in the hall, feeling well without complaints.  Her HR while walking is 50-60bpm. She is aware we will resume Tikosyn tonight at lower dose.  Tommye Standard, PA-C

## 2016-06-23 LAB — BASIC METABOLIC PANEL
ANION GAP: 9 (ref 5–15)
BUN: 14 mg/dL (ref 6–20)
CHLORIDE: 101 mmol/L (ref 101–111)
CO2: 27 mmol/L (ref 22–32)
Calcium: 9.1 mg/dL (ref 8.9–10.3)
Creatinine, Ser: 0.87 mg/dL (ref 0.44–1.00)
GFR calc Af Amer: >60 mL/min (ref >60)
GFR calc non Af Amer: >60 mL/min (ref >60)
GLUCOSE: 100 mg/dL — AB (ref 65–99)
Potassium: 4.2 mmol/L (ref 3.5–5.1)
Sodium: 137 mmol/L (ref 135–145)

## 2016-06-23 LAB — MAGNESIUM: Magnesium: 2 mg/dL (ref 1.7–2.4)

## 2016-06-23 MED ORDER — DOFETILIDE 250 MCG PO CAPS: 250.0000 ug | ORAL_CAPSULE | Freq: Two times a day (BID) | ORAL | 5 refills | Status: DC

## 2016-06-23 MED ORDER — ENSURE ENLIVE PO LIQD: 237.0000 mL | Freq: Two times a day (BID) | ORAL | 12 refills | Status: DC

## 2016-06-23 NOTE — Discharge Summary (Signed)
Patient ID: Renee [name],  MRN: [id number], DOB/AGE: 1955-11-08 61 y.o.  Admit date: 06/18/2016 Discharge date: 06/23/2016  Primary Care Provider: Leonel Ramsay, MD Primary Cardiologist: Dr [name] Electrophysiologist: Dr Rayann Heman  Discharge Diagnoses Active Problems:   Atrial fibrillation with [facility])   History of CVA (cerebrovascular accident)   Chronic anticoagulation   Chest pain   Bradycardia   PAF (paroxysmal atrial fibrillation) [facility])    Hospital Course:  61 y.o. 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 PMHx includes: TIA/stroke 2016, AFib on a/c, COPD, HTN, HLD.  In 2017 she was admitted to [facility] with progressive fatigue, DOE, and dizziness and near syncope. That admission she had rapid AFib by EMS and observed syncop ultimately underwent cath without obstructive disease and normal LVEF.  She a hx of amiodarone being stopped 2/2 fatigue/weakness, not on beta blocker secondary to bradycardia.   She returns to [facility], 06/18/16 with recurrent c/o fatigue, CP, palpitations noted in rapid AF, was given a dose of IV BB in the ER for rate control with spontaneous CV to SB high 40's  The pt was admitted and seen in consult by Dr Rayann Heman and it was decided to start Tikosyn. The day she was to go home her QTc was prolonged and she her dose was reduced. Dr Rayann Heman saw her the morning of 06/23/16 and felt she could be discharged- QTc now 444, HR 47. Her Prozac was stopped at discharge secondary to potential QTc prolongation.    Discharge Vitals:  Blood pressure (!) 138/54, pulse (!) 45, temperature 97.6 F (36.4 C), temperature source Oral, resp. rate 18, height 5\' 10"  (1.778 m), weight 227 lb (103 kg), SpO2 97 %.    Labs: Results for orders placed or performed during the hospital encounter of 06/18/16 (from the past 24 hour(s))  Basic metabolic panel     Status: Abnormal   Collection Time: 06/23/16  2:37 AM  Result Value Ref Range   Sodium  137 135 - 145 mmol/L   Potassium 4.2 3.5 - 5.1 mmol/L   Chloride 101 101 - 111 mmol/L   CO2 27 22 - 32 mmol/L   Glucose, Bld 100 (H) 65 - 99 mg/dL   BUN 14 6 - 20 mg/dL   Creatinine, Ser 0.87 0.44 - 1.00 mg/dL   Calcium 9.1 8.9 - 10.3 mg/dL   GFR calc non Af Amer >60 >60 mL/min   GFR calc Af Amer >60 >60 mL/min   Anion gap 9 5 - 15  Magnesium     Status: None   Collection Time: 06/23/16  2:37 AM  Result Value Ref Range   Magnesium 2.0 1.7 - 2.4 mg/dL    Disposition:  Follow-up Information    [facility] Follow up on 06/27/2016.   Specialty:  Cardiology Why:  9:00AM Contact information: [address] [id number] [place] Word[place] [phone]       Thompson Grayer, MD Follow up on 07/16/2016.   Specialty:  Cardiology Why:  1:45PM Contact information: [address] [place] [address] [phone]           Discharge Medications:  Allergies as of 06/23/2016      Reactions   Nsaids Other (See Comments)   dyspepsia   2,4-d Dimethylamine (amisol) Nausea Only   Ace Inhibitors Other (See Comments)   Cramps and cough   Other Nausea Only, Other (See Comments)   AMISOL-nausea  Telmisartan Other (See Comments)   unspecified   Codeine Nausea Only   Other reaction(s): Hallucination   Prednisone Palpitations, Other (See Comments)   tachycardia tachycardia      Medication List    STOP taking these medications   BC HEADACHE POWDER PO   FLUoxetine 20 MG tablet Commonly known as:  PROZAC     TAKE these medications   albuterol 108 (90 Base) MCG/ACT inhaler Commonly known as:  PROVENTIL HFA;VENTOLIN HFA Inhale 2 puffs into the lungs every 6 (six) hours as needed for wheezing or shortness of breath.   ALPRAZolam 0.25 MG tablet Commonly known as:  XANAX Take 0.25 mg by mouth 3 (three) times daily as needed for anxiety.   apixaban 5 MG Tabs tablet Commonly known as:  ELIQUIS Take 1 tablet (5 mg  total) by mouth 2 (two) times daily.   aspirin-acetaminophen-caffeine 250-250-65 MG tablet Commonly known as:  EXCEDRIN MIGRAINE Take 1 tablet by mouth every 6 (six) hours as needed for headache.   atorvastatin 40 MG tablet Commonly known as:  LIPITOR Take 1 tablet (40 mg total) by mouth daily at 6 PM.   Cinnamon 500 MG capsule Take 1,000 mg by mouth daily.   docusate sodium 100 MG capsule Commonly known as:  COLACE Take 100 mg by mouth every evening.   dofetilide 250 MCG capsule Commonly known as:  TIKOSYN Take 1 capsule (250 mcg total) by mouth every 12 (twelve) hours.   feeding supplement (ENSURE ENLIVE) Liqd Take 237 mLs by mouth 2 (two) times daily between meals.   ferrous sulfate 325 (65 FE) MG tablet Take 325 mg by mouth daily.   levalbuterol 45 MCG/ACT inhaler Commonly known as:  XOPENEX HFA Inhale 2 puffs into the lungs every 4 (four) hours as needed for wheezing.   Loratadine 10 MG Caps Take 10 mg by mouth every evening.   losartan 50 MG tablet Commonly known as:  COZAAR Take 50 mg by mouth daily.   Magnesium 250 MG Tabs Take 250 mg by mouth daily.   omeprazole 40 MG capsule Commonly known as:  PRILOSEC Take 40 mg by mouth 2 (two) times daily.   ondansetron 4 MG tablet Commonly known as:  ZOFRAN Take 4 mg by mouth every 8 (eight) hours as needed for nausea or vomiting.   PREMARIN 0.625 MG tablet Generic drug:  estrogens (conjugated) Take 0.625 mg by mouth daily.   traZODone 100 MG tablet Commonly known as:  DESYREL Take 100 mg by mouth at bedtime as needed. sleep   Vitamin D3 1000 units Caps Take 1,000 Units by mouth daily.        Duration of Discharge Encounter: Greater than 30 minutes including physician time.  Angelena Form PA-C 06/23/2016 1:51 PM   EP Attending  Patient seen and examined. Agree with above. She is stable for DC.  Mikle Bosworth.D.

## 2016-06-23 NOTE — Discharge Instructions (Signed)
Dofetilide capsules What is this medicine? DOFETILIDE (doe FET il ide) is an antiarrhythmic drug. It helps make your heart beat regularly. This medicine also helps to slow rapid heartbeats. This medicine may be used for other purposes; ask your health care provider or pharmacist if you have questions. COMMON BRAND NAME(S): Tikosyn What should I tell my health care provider before I take this medicine? They need to know if you have any of these conditions: -heart disease -history of low levels of potassium or magnesium -kidney disease -liver disease -an unusual or allergic reaction to dofetilide, other medicines, foods, dyes, or preservatives -pregnant or trying to get pregnant -breast-feeding How should I use this medicine? Take this medicine by mouth with a glass of water. Follow the directions on the prescription label. You can take this medicine with or without food. Do not drink grapefruit juice with this medicine. Take your doses at regular intervals. Do not take your medicine more often than directed. Do not stop taking this medicine suddenly. This may cause serious, heart-related side effects. Your doctor will tell you how much medicine to take. If your doctor wants you to stop the medicine, the dose will be slowly lowered over time to avoid any side effects. Talk to your pediatrician regarding the use of this medicine in children. Special care may be needed. Overdosage: If you think you have taken too much of this medicine contact a poison control center or emergency room at once. NOTE: This medicine is only for you. Do not share this medicine with others. What if I miss a dose? If you miss a dose, take it as soon as you can. If it is almost time for your next dose, take only that dose. Do not take double or extra doses. What may interact with this medicine? Do not take this medicine with any of the following medications: -cimetidine -dolutegravir -hydrochlorothiazide alone or in  combination with other medicines -isavuconazonium -ketoconazole -megestrol -other medicines that prolong the QT interval (cause an abnormal heart rhythm) -prochlorperazine -trimethoprim alone or in combination with sulfamethoxazole -verapamil This medicine may also interact with the following medications: -amiloride -certain antidepressants like fluvoxamine or paroxetine -certain antiviral medicines for HIV or AIDS like atazanavir or darunavir -certain medicines for fungal infections like clotrimazole or miconazole -digoxin -diltiazem -dronabinol, THC -grapefruit juice -metformin -nefazodone -triamterene -zafirlukast This list may not describe all possible interactions. Give your health care provider a list of all the medicines, herbs, non-prescription drugs, or dietary supplements you use. Also tell them if you smoke, drink alcohol, or use illegal drugs. Some items may interact with your medicine. What should I watch for while using this medicine? Visit your doctor or health care professional for regular checks on your progress. Wear a medical ID bracelet or chain, and carry a card that describes your disease and details of your medicine and dosage times. Check your heart rate and blood pressure regularly while you are taking this medicine. Ask your doctor or health care professional what your heart rate and blood pressure should be, and when you should contact him or her. Your doctor or health care professional also may schedule regular tests to check your progress. You will be started on this medicine in a specialized facility for at least three days. You will be monitored to find the right dose of medicine for you. It is very important that you take your medicine exactly as prescribed when you leave the hospital. The correct dosing of this medicine is very important   to treat your condition and prevent possible serious side effects. What side effects may I notice from receiving this  medicine? Side effects that you should report to your doctor or health care professional as soon as possible: -allergic reactions like skin rash, itching or hives, swelling of the face, lips, or tongue -breathing problems -dizziness -fast or rapid beating of the heart -feeling faint or lightheaded -swelling of the ankles -unusually weak or tired -vomiting Side effects that usually do not require medical attention (report to your doctor or health care professional if they continue or are bothersome): -cough -diarrhea -difficulty sleeping -headache -nausea -stomach pain This list may not describe all possible side effects. Call your doctor for medical advice about side effects. You may report side effects to FDA at 1-800-FDA-1088. Where should I keep my medicine? Keep out of the reach of children. Store at room temperature between 15 and 30 degrees C (59 and 86 degrees F). Protect the medicine from moisture or humidity. Keep container tightly closed. Throw away any unused medicine after the expiration date. NOTE: This sheet is a summary. It may not cover all possible information. If you have questions about this medicine, talk to your doctor, pharmacist, or health care provider.  2018 [employer] Standard (2015-11-21 72:82:06) You have an appointment set up with the [facility].  Multiple studies have shown that being followed by a dedicated atrial fibrillation clinic in addition to the standard care you receive from your other physicians improves health. We believe that enrollment in the atrial fibrillation clinic will allow [place] to better care for you.   The phone number to the [facility] is [phone]. The clinic is staffed Monday through Friday from 8:30am to 5pm.  Parking Directions: The clinic is located in the Heart and Vascular Building connected to [facility]. 1)From [address] turn on to [address] and go to the 3rd entrance   (Heart and Vascular entrance) on the right. 2)Look to the right for Heart &Vascular Parking Garage. 3)A code for the entrance is required please call the clinic to receive this.   4)Take the elevators to the 1st floor. Registration is in the room with the glass walls at the end of the hallway.  If you have any trouble parking or locating the clinic, please dont hesitate to call [phone].

## 2016-06-23 NOTE — Progress Notes (Signed)
Discharged per MD order. AVS went over with patient by Charge, Adrianne RN. IV removed, telemetry d/c, CCMD notified. NO other questions at this time. Patient to home with spouse.  Cyndia Bent RN

## 2016-06-23 NOTE — Progress Notes (Signed)
Progress Note  Patient Name: Renee [name] Date of Encounter: 06/23/2016  Primary Cardiologist: [place]  Subjective   Feeling well today.   Inpatient Medications    Scheduled Meds: . apixaban  5 mg Oral BID  . atorvastatin  40 mg Oral q1800  . cholecalciferol  1,000 Units Oral Daily  . docusate sodium  100 mg Oral QPM  . dofetilide  250 mcg Oral Q12H  . estrogens (conjugated)  0.625 mg Oral Daily  . feeding supplement (ENSURE ENLIVE)  237 mL Oral BID BM  . ferrous sulfate  325 mg Oral Daily  . FLUoxetine  60 mg Oral Daily  . loratadine  10 mg Oral QPM  . losartan  50 mg Oral Daily  . magnesium oxide  200 mg Oral BID  . nitroGLYCERIN  1 inch Topical Once  . pantoprazole  40 mg Oral BID AC  . sodium chloride flush  3 mL Intravenous Q12H  . sodium chloride flush  3 mL Intravenous Q12H   Continuous Infusions: . sodium chloride    . sodium chloride     PRN Meds: sodium chloride, sodium chloride, albuterol, ALPRAZolam, aspirin-acetaminophen-caffeine, sodium chloride flush, sodium chloride flush, traZODone   Vital Signs    Vitals:   06/22/16 1201 06/22/16 1404 06/22/16 2149 06/23/16 0518  BP: (!) 145/69 (!) 110/49 (!) 108/54 (!) 109/47  Pulse: (!) 47 (!) 51 (!) 51 (!) 52  Resp:  18 18 18   Temp:  97.8 F (36.6 C) 97.6 F (36.4 C) 97.8 F (36.6 C)  TempSrc:  Oral Oral Oral  SpO2:  100% 98% 95%  Weight:      Height:        Intake/Output Summary (Last 24 hours) at 06/23/16 0941 Last data filed at 06/23/16 0830  Gross per 24 hour  Intake              960 ml  Output                0 ml  Net              960 ml   Filed Weights   06/18/16 1224  Weight: 227 lb (103 kg)    Telemetry    Sinus bradycardia - Personally Reviewed  ECG    NSR with QTC 450 - Personally Reviewed  Physical Exam   GEN: No acute distress.   Neck: No JVD Cardiac: Reg bradycardia, no murmurs, rubs, or gallops.  Respiratory: Clear to auscultation bilaterally. GI: Soft, nontender,  non-distended  MS: No edema; No deformity. Neuro:  Nonfocal  Psych: Normal affect   Labs    Chemistry Recent Labs Lab 06/21/16 0210 06/22/16 0347 06/23/16 0237  NA 136 137 137  K 3.6 3.9 4.2  CL 102 103 101  CO2 28 25 27   GLUCOSE 108* 106* 100*  BUN 10 12 14   CREATININE 0.82 0.67 0.87  CALCIUM 8.9 8.9 9.1  GFRNONAA >60 >60 >60  GFRAA >60 >60 >60  ANIONGAP 6 9 9      Hematology Recent Labs Lab 06/18/16 1220 06/19/16 0007 06/20/16 0217  WBC 6.4 6.8 6.2  RBC 4.81 4.06 4.21  HGB 13.7 11.3* 11.5*  HCT 41.1 36.0 37.3  MCV 85.4 88.7 88.6  MCH 28.5 27.8 27.3  MCHC 33.3 31.4 30.8  RDW 13.9 14.4 14.2  PLT 244 232 231    Cardiac Enzymes Recent Labs Lab 06/18/16 1850 06/19/16 0012  TROPONINI 0.08* 0.06*    Recent Labs Lab  06/18/16 1238  TROPIPOC 0.01     BNPNo results for input(s): BNP, PROBNP in the last 168 hours.   DDimer No results for input(s): DDIMER in the last 168 hours.   Radiology    No results found.  Cardiac Studies   none  Patient Profile     61 y.o. female admitted for Tikosyn. Her QT prolonged and she has had a dose held and then decreased and QTC from last night looks good.  Assessment & Plan    1. PAF - admitted for Tikosyn initiation and had to have her dose reduced. She is improved today and appears to be maintaining NSR. Await repeat ECG after Tikosyn and if corrected QTC less than 480, ok for DC home with early followup. Avoid all QT prolonging drugs.   Signed, Cristopher Peru, MD  06/23/2016, 9:41 AM  Patient ID: Roseanne Kaufman, female   DOB: 10-30-1955, 61 y.o.   MRN: [id number]

## 2016-06-27 ENCOUNTER — Encounter (HOSPITAL_COMMUNITY): Payer: Self-pay | Admitting: Nurse Practitioner

## 2016-06-27 ENCOUNTER — Other Ambulatory Visit (HOSPITAL_COMMUNITY): Payer: Self-pay | Admitting: *Deleted

## 2016-06-27 ENCOUNTER — Ambulatory Visit (HOSPITAL_COMMUNITY)
Admit: 2016-06-27 | Discharge: 2016-06-27 | Disposition: A | Discharge status: Home / Self Care | Payer: Managed Care, Other (non HMO) | Attending: Nurse Practitioner | Admitting: Nurse Practitioner

## 2016-06-27 VITALS — BP 142/80 | HR 57 | Ht 70.0 in | Wt 235.0 lb

## 2016-06-27 DIAGNOSIS — Z8249 Family history of ischemic heart disease and other diseases of the circulatory system: Secondary | ICD-10-CM | POA: Insufficient documentation

## 2016-06-27 DIAGNOSIS — Z7982 Long term (current) use of aspirin: Secondary | ICD-10-CM | POA: Insufficient documentation

## 2016-06-27 DIAGNOSIS — Z7901 Long term (current) use of anticoagulants: Secondary | ICD-10-CM | POA: Insufficient documentation

## 2016-06-27 DIAGNOSIS — I4891 Unspecified atrial fibrillation: Secondary | ICD-10-CM | POA: Diagnosis present

## 2016-06-27 DIAGNOSIS — Z87891 Personal history of nicotine dependence: Secondary | ICD-10-CM | POA: Diagnosis not present

## 2016-06-27 LAB — BASIC METABOLIC PANEL
ANION GAP: 5 (ref 5–15)
BUN: 11 mg/dL (ref 6–20)
CO2: 26 mmol/L (ref 22–32)
Calcium: 9 mg/dL (ref 8.9–10.3)
Chloride: 106 mmol/L (ref 101–111)
Creatinine, Ser: 0.72 mg/dL (ref 0.44–1.00)
GFR calc Af Amer: >60 mL/min (ref >60)
GFR calc non Af Amer: >60 mL/min (ref >60)
GLUCOSE: 74 mg/dL (ref 65–99)
POTASSIUM: 3.5 mmol/L (ref 3.5–5.1)
Sodium: 137 mmol/L (ref 135–145)

## 2016-06-27 LAB — MAGNESIUM: Magnesium: 1.8 mg/dL (ref 1.7–2.4)

## 2016-06-27 MED ORDER — MAGNESIUM 250 MG PO TABS: 250.0000 mg | ORAL_TABLET | Freq: Two times a day (BID) | ORAL | Status: AC

## 2016-06-27 MED ORDER — POTASSIUM CHLORIDE CRYS ER 20 MEQ PO TBCR: 20.0000 meq | EXTENDED_RELEASE_TABLET | Freq: Two times a day (BID) | ORAL | 6 refills | Status: DC
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
## 2016-07-02 ENCOUNTER — Ambulatory Visit (HOSPITAL_COMMUNITY)
Admission: RE | Admit: 2016-07-02 | Discharge: 2016-07-02 | Disposition: A | Discharge status: Home / Self Care | Payer: Managed Care, Other (non HMO) | Source: Ambulatory Visit | Attending: Nurse Practitioner | Admitting: Nurse Practitioner

## 2016-07-02 DIAGNOSIS — I4891 Unspecified atrial fibrillation: Secondary | ICD-10-CM | POA: Diagnosis present

## 2016-07-02 LAB — BASIC METABOLIC PANEL
Anion gap: 7 (ref 5–15)
BUN: 13 mg/dL (ref 6–20)
CALCIUM: 9.3 mg/dL (ref 8.9–10.3)
CO2: 27 mmol/L (ref 22–32)
Chloride: 103 mmol/L (ref 101–111)
Creatinine, Ser: 0.79 mg/dL (ref 0.44–1.00)
Glucose, Bld: 111 mg/dL — ABNORMAL HIGH (ref 65–99)
POTASSIUM: 4.3 mmol/L (ref 3.5–5.1)
Sodium: 137 mmol/L (ref 135–145)

## 2016-07-02 LAB — MAGNESIUM: MAGNESIUM: 2.1 mg/dL (ref 1.7–2.4)

## 2016-07-16 ENCOUNTER — Encounter (INDEPENDENT_AMBULATORY_CARE_PROVIDER_SITE_OTHER): Payer: Self-pay

## 2016-07-16 ENCOUNTER — Ambulatory Visit (INDEPENDENT_AMBULATORY_CARE_PROVIDER_SITE_OTHER): Payer: Managed Care, Other (non HMO) | Admitting: Internal Medicine

## 2016-07-16 ENCOUNTER — Encounter: Payer: Self-pay | Admitting: Internal Medicine

## 2016-07-16 VITALS — BP 130/70 | HR 56 | Ht 68.0 in | Wt 231.2 lb

## 2016-07-16 DIAGNOSIS — I4891 Unspecified atrial fibrillation: Secondary | ICD-10-CM

## 2016-07-16 MED ORDER — ATORVASTATIN CALCIUM 40 MG PO TABS: 40.0000 mg | ORAL_TABLET | Freq: Every day | ORAL | 3 refills | Status: DC

## 2016-07-16 MED ORDER — LOSARTAN POTASSIUM 50 MG PO TABS: 50.0000 mg | ORAL_TABLET | Freq: Every day | ORAL | 3 refills | Status: DC

## 2016-07-16 MED ORDER — APIXABAN 5 MG PO TABS: 5.0000 mg | ORAL_TABLET | Freq: Two times a day (BID) | ORAL | 6 refills | Status: DC

## 2016-07-16 NOTE — Patient Instructions (Signed)
Medication Instructions:   Your physician recommends that you continue on your current medications as directed. Please refer to the Current Medication list given to you today.   Labwork: None ordered   Testing/Procedures: None ordered   Follow-Up:  Your physician recommends that you schedule a follow-up appointment in: 4 weeks with Roderic Palau, NP   Any Other Special Instructions Will Be Listed Below (If Applicable).     If you need a refill on your cardiac medications before your next appointment, please call your pharmacy.
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
## 2016-07-18 ENCOUNTER — Telehealth: Payer: Self-pay | Admitting: Internal Medicine

## 2016-07-18 NOTE — Telephone Encounter (Signed)
Returned Amber's Investment banker, corporate at [employer]) call. Confirmed that patient is on fluoxetine 60mg  daily (wasn't listed on our medication list) and updated her med list. There is an increased risk of EPS/NMS with the concomitant use of Reglan and fluoxetine - advised against using these medications together. Also advised against the use of Compazine since its use is contraindicated with Tikosyn (category X interaction). Would avoid the use of Zofran as well given increased risk of QTc prolongation with Tikosyn. Recommended that the safest antiemetic would be Phenergan at the lowest dose and frequency possible. Amber voiced agreement and will notify Dr. Tiffany Kocher of this information today.

## 2016-07-18 NOTE — Telephone Encounter (Signed)
New Message    Renee [name] was seen by Dr Rayann Heman on Monday, there is a drug interaction between the Reglan  and slouxetine , it can cause EPS symptoms, is there another medication that you would recommend or can they give pt 5mg  on comprazine?

## 2016-07-19 ENCOUNTER — Other Ambulatory Visit (HOSPITAL_COMMUNITY): Payer: Self-pay | Admitting: *Deleted

## 2016-07-19 MED ORDER — POTASSIUM CHLORIDE ER 20 MEQ PO TBCR: 1.0000 | EXTENDED_RELEASE_TABLET | Freq: Two times a day (BID) | ORAL | Status: DC

## 2016-07-19 NOTE — Telephone Encounter (Signed)
Pt cld requesting that her Kdur be switched per pharmacist recommendation to the K-tab which is coated and easier to get down.  I told pt I would call new Rx in.  If they only have this in 10 meq she would need to take 2 tabs twice a day.  Pt understood.

## 2016-08-15 ENCOUNTER — Ambulatory Visit (HOSPITAL_COMMUNITY)
Admission: RE | Admit: 2016-08-15 | Discharge: 2016-08-15 | Disposition: A | Discharge status: Home / Self Care | Payer: Managed Care, Other (non HMO) | Source: Ambulatory Visit | Attending: Nurse Practitioner | Admitting: Nurse Practitioner

## 2016-08-15 ENCOUNTER — Encounter (HOSPITAL_COMMUNITY): Payer: Self-pay | Admitting: Nurse Practitioner

## 2016-08-15 VITALS — BP 120/74 | HR 54 | Ht 70.0 in | Wt 230.2 lb

## 2016-08-15 DIAGNOSIS — F419 Anxiety disorder, unspecified: Secondary | ICD-10-CM | POA: Insufficient documentation

## 2016-08-15 DIAGNOSIS — Z888 Allergy status to other drugs, medicaments and biological substances status: Secondary | ICD-10-CM | POA: Diagnosis not present

## 2016-08-15 DIAGNOSIS — Z8249 Family history of ischemic heart disease and other diseases of the circulatory system: Secondary | ICD-10-CM | POA: Insufficient documentation

## 2016-08-15 DIAGNOSIS — Z885 Allergy status to narcotic agent status: Secondary | ICD-10-CM | POA: Insufficient documentation

## 2016-08-15 DIAGNOSIS — Z79899 Other long term (current) drug therapy: Secondary | ICD-10-CM | POA: Insufficient documentation

## 2016-08-15 DIAGNOSIS — I4891 Unspecified atrial fibrillation: Secondary | ICD-10-CM | POA: Diagnosis present

## 2016-08-15 DIAGNOSIS — E785 Hyperlipidemia, unspecified: Secondary | ICD-10-CM | POA: Insufficient documentation

## 2016-08-15 DIAGNOSIS — Z8589 Personal history of malignant neoplasm of other organs and systems: Secondary | ICD-10-CM | POA: Insufficient documentation

## 2016-08-15 DIAGNOSIS — I10 Essential (primary) hypertension: Secondary | ICD-10-CM | POA: Diagnosis not present

## 2016-08-15 DIAGNOSIS — I480 Paroxysmal atrial fibrillation: Secondary | ICD-10-CM

## 2016-08-15 DIAGNOSIS — Z87891 Personal history of nicotine dependence: Secondary | ICD-10-CM | POA: Diagnosis not present

## 2016-08-15 DIAGNOSIS — F329 Major depressive disorder, single episode, unspecified: Secondary | ICD-10-CM | POA: Insufficient documentation

## 2016-08-15 DIAGNOSIS — I2510 Atherosclerotic heart disease of native coronary artery without angina pectoris: Secondary | ICD-10-CM | POA: Diagnosis not present

## 2016-08-15 DIAGNOSIS — Z8544 Personal history of malignant neoplasm of other female genital organs: Secondary | ICD-10-CM | POA: Diagnosis not present

## 2016-08-15 DIAGNOSIS — K219 Gastro-esophageal reflux disease without esophagitis: Secondary | ICD-10-CM | POA: Diagnosis not present

## 2016-08-15 DIAGNOSIS — Z8673 Personal history of transient ischemic attack (TIA), and cerebral infarction without residual deficits: Secondary | ICD-10-CM | POA: Diagnosis not present

## 2016-08-15 DIAGNOSIS — Z7901 Long term (current) use of anticoagulants: Secondary | ICD-10-CM | POA: Diagnosis not present

## 2016-08-15 DIAGNOSIS — I495 Sick sinus syndrome: Secondary | ICD-10-CM | POA: Insufficient documentation

## 2016-08-15 DIAGNOSIS — J439 Emphysema, unspecified: Secondary | ICD-10-CM | POA: Diagnosis not present

## 2016-08-15 LAB — BASIC METABOLIC PANEL
Anion gap: 7 (ref 5–15)
BUN: 7 mg/dL (ref 6–20)
CALCIUM: 9 mg/dL (ref 8.9–10.3)
CHLORIDE: 104 mmol/L (ref 101–111)
CO2: 26 mmol/L (ref 22–32)
CREATININE: 0.67 mg/dL (ref 0.44–1.00)
GFR calc Af Amer: >60 mL/min (ref >60)
GFR calc non Af Amer: >60 mL/min (ref >60)
Glucose, Bld: 124 mg/dL — ABNORMAL HIGH (ref 65–99)
Potassium: 3.9 mmol/L (ref 3.5–5.1)
Sodium: 137 mmol/L (ref 135–145)

## 2016-08-15 LAB — MAGNESIUM: Magnesium: 2 mg/dL (ref 1.7–2.4)
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
## 2016-08-15 NOTE — Progress Notes (Addendum)
error 

## 2016-09-07 NOTE — Addendum Note (Signed)
Encounter addended by: Sherran Needs, NP on: 09/07/2016 12:46 PM<BR>    Actions taken: LOS modified

## 2016-10-26 ENCOUNTER — Other Ambulatory Visit: Payer: Self-pay

## 2016-10-26 ENCOUNTER — Emergency Department (HOSPITAL_COMMUNITY): Payer: Managed Care, Other (non HMO)

## 2016-10-26 ENCOUNTER — Encounter (HOSPITAL_COMMUNITY): Payer: Self-pay | Admitting: Emergency Medicine

## 2016-10-26 ENCOUNTER — Telehealth: Payer: Self-pay | Admitting: Internal Medicine

## 2016-10-26 ENCOUNTER — Emergency Department (HOSPITAL_COMMUNITY)
Admission: EM | Admit: 2016-10-26 | Discharge: 2016-10-26 | Disposition: A | Discharge status: Home / Self Care | Payer: Managed Care, Other (non HMO) | Attending: Emergency Medicine | Admitting: Emergency Medicine

## 2016-10-26 DIAGNOSIS — R0789 Other chest pain: Secondary | ICD-10-CM | POA: Diagnosis present

## 2016-10-26 DIAGNOSIS — F329 Major depressive disorder, single episode, unspecified: Secondary | ICD-10-CM | POA: Insufficient documentation

## 2016-10-26 DIAGNOSIS — R072 Precordial pain: Secondary | ICD-10-CM

## 2016-10-26 DIAGNOSIS — I2510 Atherosclerotic heart disease of native coronary artery without angina pectoris: Secondary | ICD-10-CM | POA: Insufficient documentation

## 2016-10-26 DIAGNOSIS — Z9049 Acquired absence of other specified parts of digestive tract: Secondary | ICD-10-CM | POA: Diagnosis not present

## 2016-10-26 DIAGNOSIS — I10 Essential (primary) hypertension: Secondary | ICD-10-CM | POA: Insufficient documentation

## 2016-10-26 DIAGNOSIS — Z87891 Personal history of nicotine dependence: Secondary | ICD-10-CM | POA: Diagnosis not present

## 2016-10-26 DIAGNOSIS — Z8679 Personal history of other diseases of the circulatory system: Secondary | ICD-10-CM | POA: Diagnosis not present

## 2016-10-26 DIAGNOSIS — Z8673 Personal history of transient ischemic attack (TIA), and cerebral infarction without residual deficits: Secondary | ICD-10-CM | POA: Insufficient documentation

## 2016-10-26 DIAGNOSIS — R001 Bradycardia, unspecified: Secondary | ICD-10-CM | POA: Insufficient documentation

## 2016-10-26 DIAGNOSIS — Z79899 Other long term (current) drug therapy: Secondary | ICD-10-CM | POA: Insufficient documentation

## 2016-10-26 DIAGNOSIS — Z8544 Personal history of malignant neoplasm of other female genital organs: Secondary | ICD-10-CM | POA: Diagnosis not present

## 2016-10-26 DIAGNOSIS — Z7901 Long term (current) use of anticoagulants: Secondary | ICD-10-CM | POA: Diagnosis not present

## 2016-10-26 DIAGNOSIS — F419 Anxiety disorder, unspecified: Secondary | ICD-10-CM | POA: Diagnosis not present

## 2016-10-26 LAB — BASIC METABOLIC PANEL
Anion gap: 8 (ref 5–15)
BUN: 11 mg/dL (ref 6–20)
CALCIUM: 8.7 mg/dL — AB (ref 8.9–10.3)
CO2: 24 mmol/L (ref 22–32)
Chloride: 104 mmol/L (ref 101–111)
Creatinine, Ser: 0.73 mg/dL (ref 0.44–1.00)
Glucose, Bld: 94 mg/dL (ref 65–99)
POTASSIUM: 4.1 mmol/L (ref 3.5–5.1)
Sodium: 136 mmol/L (ref 135–145)

## 2016-10-26 LAB — CBC
HEMATOCRIT: 38.2 % (ref 36.0–46.0)
HEMOGLOBIN: 12 g/dL (ref 12.0–15.0)
MCH: 27.5 pg (ref 26.0–34.0)
MCHC: 31.4 g/dL (ref 30.0–36.0)
MCV: 87.4 fL (ref 78.0–100.0)
Platelets: 246 10*3/uL (ref 150–400)
RBC: 4.37 MIL/uL (ref 3.87–5.11)
RDW: 13.9 % (ref 11.5–15.5)
WBC: 6.3 10*3/uL (ref 4.0–10.5)

## 2016-10-26 LAB — I-STAT TROPONIN, ED: TROPONIN I, POC: 0.01 ng/mL (ref 0.00–0.08)
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
## 2016-10-26 NOTE — Telephone Encounter (Signed)
New message    Pt is calling asking for a call back.    Patient c/o Palpitations:  High priority if patient c/o lightheadedness, shortness of breath, or chest pain  1) How long have you had palpitations/irregular HR/ Afib? Are you having the symptoms now? Gotten worse over the last week.   2) Are you currently experiencing lightheadedness, SOB or CP? Lightheadedness, sob, dizziness  3) Do you have a history of afib (atrial fibrillation) or irregular heart rhythm? Yes   4) Have you checked your BP or HR? (document readings if available): HR has been fluctuating   5) Are you experiencing any other symptoms?

## 2016-10-26 NOTE — Discharge Instructions (Signed)
It was our pleasure to provide your ER care today - we hope that you feel better.  Follow up with your cardiologist in the coming week - call office Monday AM to arrange appointment with them.  Return to ER if worse, new symptoms, trouble breathing, recurrent/persistent chest pain, persistent fast heart beat, fainting, other concern.

## 2016-10-26 NOTE — ED Triage Notes (Signed)
Pt to ED with c/o heaviness in her left chest.  Irreg. Heart beat and feeling fatigued.  Pt also c/o pain in left arm

## 2016-10-26 NOTE — ED Notes (Signed)
Pt returned from xray at this time. Bed locked and lowered with call bell in reach.

## 2016-10-26 NOTE — Telephone Encounter (Signed)
Received call from pt who reports for the last week & 1/2  she has felt her HR has been up and down, anywhere from 42 to 78, she can't catch her breath, has a headache most all the time and feels like she is having a constant panic attack.  She is also having dizziness and frequent diarrhea. She feels as if the s/s are coming from her Tikosyn and are r/t her At Fib.  Advised pt since she is so symptomatic she should report to the closest ED for further evaluation.  She is in agreement and will attempt to get in touch with her husband to take her.

## 2016-10-26 NOTE — ED Notes (Signed)
Pt states she was recently put on Tikosyn in May or June of this year when she was diagnosed with a-fib. Pt states she feels weak on the left side and is having pains down her left jaw and left arm. Pt has residual muscle weakness on the left side from her stroke 2 years ago. Husband bedside.

## 2016-10-29 NOTE — Telephone Encounter (Signed)
Spoke with patient and let her know if she feels she needs to be seen prior to Friday she may call the afib clinic.  She was appreciative of my call and will call clinic if needed

## 2016-10-29 NOTE — Telephone Encounter (Signed)
Pt went to ER Friday per our request. They told her to call today to see if we need to see her, made appt 230pm 10-12 with Allred as he had a cancellation. If she does not need will cancel and pt aware. Having headaches and not sure if from med started in May. HR was up and down in ER, some better today-pls advise. [phone]

## 2016-11-02 ENCOUNTER — Ambulatory Visit (INDEPENDENT_AMBULATORY_CARE_PROVIDER_SITE_OTHER): Payer: Managed Care, Other (non HMO) | Admitting: Internal Medicine

## 2016-11-02 ENCOUNTER — Encounter: Payer: Self-pay | Admitting: Internal Medicine

## 2016-11-02 VITALS — BP 122/80 | HR 47 | Ht 68.0 in | Wt 231.0 lb

## 2016-11-02 DIAGNOSIS — I480 Paroxysmal atrial fibrillation: Secondary | ICD-10-CM | POA: Diagnosis not present

## 2016-11-02 DIAGNOSIS — I495 Sick sinus syndrome: Secondary | ICD-10-CM | POA: Diagnosis not present

## 2016-11-02 NOTE — Patient Instructions (Addendum)
Medication Instructions:  Your physician recommends that you continue on your current medications as directed. Please refer to the Current Medication list given to you today.  -- If you need a refill on your cardiac medications before your next appointment, please call your pharmacy. --  Labwork: Your physician recommends that you return for lab work today:  CBC/BMET  Testing/Procedures: Your physician has recommended that you have a pacemaker inserted-- 11/08/16. A pacemaker is a small device that is placed under the skin of your chest or abdomen to help control abnormal heart rhythms. This device uses electrical pulses to prompt the heart to beat at a normal rate. Pacemakers are used to treat heart rhythms that are too slow. Wire (leads) are attached to the pacemaker that goes into the chambers of you heart. This is done in the hospital and usually requires and overnight stay. Please see the instruction sheet given to you today for more information.   Please arrive at [facility] at 11:30AM Do not eat or drink after midnight the night prior to the procedure Do not take any medications the morning of the test Hold Eloquis after your Wednesday morning dose Plan for one night stay Will need someone to drive you home at discharge Use surgical scrub as directed.      Follow-Up: Your physician wants you to follow-up in: 10-14 days wound check and 3 months with Dr. Rayann Heman.     Thank you for choosing [facility]!!   Frederik Schmidt, RN [phone]  Any Other Special Instructions Will Be Listed Below (If Applicable).
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
## 2016-11-05 LAB — CBC WITH DIFFERENTIAL/PLATELET
Basophils Absolute: 0 10*3/uL (ref 0.0–0.2)
Basos: 0 %
EOS (ABSOLUTE): 0.1 10*3/uL (ref 0.0–0.4)
Eos: 1 %
Hematocrit: 39 % (ref 34.0–46.6)
Hemoglobin: 13.3 g/dL (ref 11.1–15.9)
Immature Grans (Abs): 0 10*3/uL (ref 0.0–0.1)
Immature Granulocytes: 0 %
Lymphocytes Absolute: 1.9 10*3/uL (ref 0.7–3.1)
Lymphs: 25 %
MCH: 29.2 pg (ref 26.6–33.0)
MCHC: 34.1 g/dL (ref 31.5–35.7)
MCV: 86 fL (ref 79–97)
Monocytes Absolute: 0.6 10*3/uL (ref 0.1–0.9)
Monocytes: 8 %
Neutrophils Absolute: 5 10*3/uL (ref 1.4–7.0)
Neutrophils: 66 %
Platelets: 293 10*3/uL (ref 150–379)
RBC: 4.56 x10E6/uL (ref 3.77–5.28)
RDW: 14.3 % (ref 12.3–15.4)
WBC: 7.6 10*3/uL (ref 3.4–10.8)

## 2016-11-05 LAB — BASIC METABOLIC PANEL
BUN/Creatinine Ratio: 15 (ref 9–23)
BUN: 11 mg/dL (ref 6–24)
CALCIUM: 8.8 mg/dL (ref 8.7–10.2)
CHLORIDE: 100 mmol/L (ref 96–106)
CO2: 25 mmol/L (ref 20–29)
Creatinine, Ser: 0.73 mg/dL (ref 0.57–1.00)
GFR calc non Af Amer: 90 mL/min/{1.73_m2} (ref >59)
GFR, EST AFRICAN AMERICAN: 104 mL/min/{1.73_m2} (ref >59)
GLUCOSE: 87 mg/dL (ref 65–99)
Potassium: 4.6 mmol/L (ref 3.5–5.2)
Sodium: 136 mmol/L (ref 134–144)

## 2016-11-08 ENCOUNTER — Encounter (HOSPITAL_COMMUNITY)
Admission: RE | Disposition: A | Discharge status: Home / Self Care | Payer: Self-pay | Source: Ambulatory Visit | Attending: Internal Medicine

## 2016-11-08 ENCOUNTER — Ambulatory Visit (HOSPITAL_COMMUNITY)
Admission: RE | Admit: 2016-11-08 | Discharge: 2016-11-09 | Disposition: A | Discharge status: Home / Self Care | Payer: Managed Care, Other (non HMO) | Source: Ambulatory Visit | Attending: Internal Medicine | Admitting: Internal Medicine

## 2016-11-08 ENCOUNTER — Encounter (HOSPITAL_COMMUNITY): Payer: Self-pay | Admitting: Internal Medicine

## 2016-11-08 DIAGNOSIS — I481 Persistent atrial fibrillation: Secondary | ICD-10-CM | POA: Diagnosis not present

## 2016-11-08 DIAGNOSIS — I495 Sick sinus syndrome: Secondary | ICD-10-CM | POA: Diagnosis present

## 2016-11-08 DIAGNOSIS — R0789 Other chest pain: Secondary | ICD-10-CM | POA: Insufficient documentation

## 2016-11-08 DIAGNOSIS — G43909 Migraine, unspecified, not intractable, without status migrainosus: Secondary | ICD-10-CM | POA: Insufficient documentation

## 2016-11-08 DIAGNOSIS — Z8673 Personal history of transient ischemic attack (TIA), and cerebral infarction without residual deficits: Secondary | ICD-10-CM | POA: Diagnosis not present

## 2016-11-08 DIAGNOSIS — M1990 Unspecified osteoarthritis, unspecified site: Secondary | ICD-10-CM | POA: Insufficient documentation

## 2016-11-08 DIAGNOSIS — F329 Major depressive disorder, single episode, unspecified: Secondary | ICD-10-CM | POA: Insufficient documentation

## 2016-11-08 DIAGNOSIS — F419 Anxiety disorder, unspecified: Secondary | ICD-10-CM | POA: Insufficient documentation

## 2016-11-08 DIAGNOSIS — J439 Emphysema, unspecified: Secondary | ICD-10-CM | POA: Diagnosis not present

## 2016-11-08 DIAGNOSIS — Z7901 Long term (current) use of anticoagulants: Secondary | ICD-10-CM | POA: Insufficient documentation

## 2016-11-08 DIAGNOSIS — K219 Gastro-esophageal reflux disease without esophagitis: Secondary | ICD-10-CM | POA: Insufficient documentation

## 2016-11-08 DIAGNOSIS — I341 Nonrheumatic mitral (valve) prolapse: Secondary | ICD-10-CM | POA: Insufficient documentation

## 2016-11-08 DIAGNOSIS — I2510 Atherosclerotic heart disease of native coronary artery without angina pectoris: Secondary | ICD-10-CM | POA: Diagnosis not present

## 2016-11-08 DIAGNOSIS — Z23 Encounter for immunization: Secondary | ICD-10-CM | POA: Diagnosis not present

## 2016-11-08 DIAGNOSIS — I10 Essential (primary) hypertension: Secondary | ICD-10-CM | POA: Insufficient documentation

## 2016-11-08 DIAGNOSIS — Z959 Presence of cardiac and vascular implant and graft, unspecified: Secondary | ICD-10-CM

## 2016-11-08 DIAGNOSIS — E785 Hyperlipidemia, unspecified: Secondary | ICD-10-CM | POA: Diagnosis not present

## 2016-11-08 HISTORY — PX: INSERT / REPLACE / REMOVE PACEMAKER: SUR710

## 2016-11-08 HISTORY — PX: PACEMAKER IMPLANT: EP1218

## 2016-11-08 LAB — SURGICAL PCR SCREEN
MRSA, PCR: NEGATIVE
Staphylococcus aureus: POSITIVE — AB

## 2016-11-08 SURGERY — PACEMAKER IMPLANT

## 2016-11-08 MED ORDER — HEPARIN (PORCINE) IN NACL 2-0.9 UNIT/ML-% IJ SOLN
INTRAMUSCULAR | Status: AC
  Filled 2016-11-08: qty 500

## 2016-11-08 MED ORDER — FENTANYL CITRATE (PF) 100 MCG/2ML IJ SOLN
INTRAMUSCULAR | Status: DC | PRN
  Administered 2016-11-08: 25 ug via INTRAVENOUS
  Administered 2016-11-08: 12.5 ug via INTRAVENOUS
  Administered 2016-11-08: 25 ug via INTRAVENOUS

## 2016-11-08 MED ORDER — BUPIVACAINE HCL (PF) 0.25 % IJ SOLN
INTRAMUSCULAR | Status: AC
  Filled 2016-11-08: qty 60

## 2016-11-08 MED ORDER — ALPRAZOLAM 0.25 MG PO TABS
0.2500 mg | ORAL_TABLET | Freq: Three times a day (TID) | ORAL | Status: DC | PRN
  Administered 2016-11-08 (×2): 0.25 mg via ORAL
  Filled 2016-11-08 (×2): qty 1

## 2016-11-08 MED ORDER — MIDAZOLAM HCL 5 MG/5ML IJ SOLN
INTRAMUSCULAR | Status: DC | PRN
Start: 2016-11-08 — End: 2016-11-08
  Administered 2016-11-08: 1 mg via INTRAVENOUS
  Administered 2016-11-08: 2 mg via INTRAVENOUS
  Administered 2016-11-08 (×4): 1 mg via INTRAVENOUS

## 2016-11-08 MED ORDER — MUPIROCIN 2 % EX OINT: 1.0000 "application " | TOPICAL_OINTMENT | Freq: Once | CUTANEOUS | Status: AC

## 2016-11-08 MED ORDER — DOFETILIDE 250 MCG PO CAPS
250.0000 ug | ORAL_CAPSULE | Freq: Two times a day (BID) | ORAL | Status: DC
  Administered 2016-11-08 – 2016-11-09 (×2): 250 ug via ORAL
  Filled 2016-11-08 (×2): qty 1

## 2016-11-08 MED ORDER — GENTAMICIN SULFATE 40 MG/ML IJ SOLN
80.0000 mg | INTRAMUSCULAR | Status: AC
  Administered 2016-11-08: 80 mg

## 2016-11-08 MED ORDER — SODIUM CHLORIDE 0.9% FLUSH: 3.0000 mL | INTRAVENOUS | Status: DC | PRN

## 2016-11-08 MED ORDER — CEFAZOLIN SODIUM-DEXTROSE 2-4 GM/100ML-% IV SOLN
INTRAVENOUS | Status: AC
  Filled 2016-11-08: qty 100

## 2016-11-08 MED ORDER — FLUOXETINE HCL 20 MG PO CAPS
60.0000 mg | ORAL_CAPSULE | Freq: Every day | ORAL | Status: DC
  Administered 2016-11-08 – 2016-11-09 (×2): 60 mg via ORAL
  Filled 2016-11-08 (×2): qty 3

## 2016-11-08 MED ORDER — SODIUM CHLORIDE 0.9 % IV SOLN
INTRAVENOUS | Status: DC
  Administered 2016-11-08: 12:00:00 via INTRAVENOUS

## 2016-11-08 MED ORDER — MIDAZOLAM HCL 5 MG/5ML IJ SOLN
INTRAMUSCULAR | Status: AC
  Filled 2016-11-08: qty 5

## 2016-11-08 MED ORDER — MUPIROCIN 2 % EX OINT
1.0000 "application " | TOPICAL_OINTMENT | Freq: Two times a day (BID) | CUTANEOUS | Status: DC
  Administered 2016-11-08 – 2016-11-09 (×2): 1 via NASAL
  Filled 2016-11-08: qty 22

## 2016-11-08 MED ORDER — TRAZODONE HCL 50 MG PO TABS: 50.0000 mg | ORAL_TABLET | Freq: Every evening | ORAL | Status: DC | PRN

## 2016-11-08 MED ORDER — SODIUM CHLORIDE 0.9 % IV SOLN: 250.0000 mL | INTRAVENOUS | Status: DC | PRN

## 2016-11-08 MED ORDER — LOSARTAN POTASSIUM 50 MG PO TABS
50.0000 mg | ORAL_TABLET | Freq: Every day | ORAL | Status: DC
  Administered 2016-11-09: 09:00:00 50 mg via ORAL
  Filled 2016-11-08: qty 1

## 2016-11-08 MED ORDER — BUPIVACAINE HCL (PF) 0.25 % IJ SOLN
INTRAMUSCULAR | Status: AC
  Filled 2016-11-08: qty 30

## 2016-11-08 MED ORDER — ENSURE ENLIVE PO LIQD
237.0000 mL | Freq: Two times a day (BID) | ORAL | Status: DC
  Administered 2016-11-09: 237 mL via ORAL
  Filled 2016-11-08 (×4): qty 237

## 2016-11-08 MED ORDER — BUPIVACAINE HCL (PF) 0.25 % IJ SOLN
INTRAMUSCULAR | Status: DC | PRN
Start: 2016-11-08 — End: 2016-11-08
  Administered 2016-11-08: 75 mL

## 2016-11-08 MED ORDER — MAGNESIUM OXIDE 400 (241.3 MG) MG PO TABS
200.0000 mg | ORAL_TABLET | Freq: Two times a day (BID) | ORAL | Status: DC
  Administered 2016-11-09: 09:00:00 200 mg via ORAL
  Filled 2016-11-08 (×2): qty 1

## 2016-11-08 MED ORDER — HEPARIN (PORCINE) IN NACL 2-0.9 UNIT/ML-% IJ SOLN
INTRAMUSCULAR | Status: AC | PRN
  Administered 2016-11-08: 500 mL

## 2016-11-08 MED ORDER — CEFAZOLIN SODIUM-DEXTROSE 1-4 GM/50ML-% IV SOLN
1.0000 g | Freq: Four times a day (QID) | INTRAVENOUS | Status: AC
  Administered 2016-11-08 – 2016-11-09 (×3): 1 g via INTRAVENOUS
  Filled 2016-11-08 (×3): qty 50

## 2016-11-08 MED ORDER — FENTANYL CITRATE (PF) 100 MCG/2ML IJ SOLN
INTRAMUSCULAR | Status: AC
  Filled 2016-11-08: qty 2

## 2016-11-08 MED ORDER — YOU HAVE A PACEMAKER BOOK
Freq: Once | Status: AC
  Administered 2016-11-08: 16:00:00
  Filled 2016-11-08: qty 1

## 2016-11-08 MED ORDER — CHLORHEXIDINE GLUCONATE CLOTH 2 % EX PADS
6.0000 | MEDICATED_PAD | Freq: Every day | CUTANEOUS | Status: DC
Start: 2016-11-08 — End: 2016-11-09
  Administered 2016-11-09: 09:00:00 6 via TOPICAL

## 2016-11-08 MED ORDER — PNEUMOCOCCAL VAC POLYVALENT 25 MCG/0.5ML IJ INJ
0.5000 mL | INJECTION | INTRAMUSCULAR | Status: AC
  Administered 2016-11-09: 0.5 mL via INTRAMUSCULAR
  Filled 2016-11-08: qty 0.5

## 2016-11-08 MED ORDER — ONDANSETRON HCL 4 MG/2ML IJ SOLN: 4.0000 mg | Freq: Four times a day (QID) | INTRAMUSCULAR | Status: DC | PRN

## 2016-11-08 MED ORDER — POTASSIUM CHLORIDE 20 MEQ PO PACK
20.0000 meq | PACK | Freq: Two times a day (BID) | ORAL | Status: DC
  Administered 2016-11-09: 20 meq via ORAL
  Filled 2016-11-08: qty 1

## 2016-11-08 MED ORDER — MUPIROCIN 2 % EX OINT
TOPICAL_OINTMENT | CUTANEOUS | Status: AC
  Administered 2016-11-08: 1
  Filled 2016-11-08: qty 22

## 2016-11-08 MED ORDER — INFLUENZA VAC SPLIT QUAD 0.5 ML IM SUSY
0.5000 mL | PREFILLED_SYRINGE | INTRAMUSCULAR | Status: AC
  Administered 2016-11-09: 0.5 mL via INTRAMUSCULAR
  Filled 2016-11-08: qty 0.5

## 2016-11-08 MED ORDER — GENTAMICIN SULFATE 40 MG/ML IJ SOLN
INTRAMUSCULAR | Status: AC
  Filled 2016-11-08: qty 2

## 2016-11-08 MED ORDER — CEFAZOLIN SODIUM-DEXTROSE 2-4 GM/100ML-% IV SOLN
2.0000 g | INTRAVENOUS | Status: AC
  Administered 2016-11-08: 2 g via INTRAVENOUS

## 2016-11-08 MED ORDER — HYDROCODONE-ACETAMINOPHEN 5-325 MG PO TABS
1.0000 | ORAL_TABLET | ORAL | Status: DC | PRN
  Administered 2016-11-08 – 2016-11-09 (×3): 1 via ORAL
  Filled 2016-11-08 (×3): qty 1

## 2016-11-08 MED ORDER — SODIUM CHLORIDE 0.9% FLUSH
3.0000 mL | Freq: Two times a day (BID) | INTRAVENOUS | Status: DC
  Administered 2016-11-08 – 2016-11-09 (×2): 3 mL via INTRAVENOUS

## 2016-11-08 MED ORDER — ACETAMINOPHEN 325 MG PO TABS: 325.0000 mg | ORAL_TABLET | ORAL | Status: DC | PRN

## 2016-11-08 MED ORDER — FLUOXETINE HCL 20 MG PO CAPS: 60.0000 mg | ORAL_CAPSULE | Freq: Every day | ORAL | Status: DC

## 2016-11-08 SURGICAL SUPPLY — 7 items
CABLE SURGICAL S-101-97-12 (CABLE) ×3 IMPLANT
LEAD TENDRIL MRI 46CM LPA1200M (Lead) ×2 IMPLANT
LEAD TENDRIL MRI 58CM LPA1200M (Lead) ×3 IMPLANT
PACEMAKER ASSURITY DR-RF (Pacemaker) ×2 IMPLANT
PAD DEFIB LIFELINK (PAD) ×3 IMPLANT
SHEATH CLASSIC 8F (SHEATH) ×4 IMPLANT
TRAY PACEMAKER INSERTION (PACKS) ×3 IMPLANT
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
## 2016-11-08 NOTE — Interval H&P Note (Signed)
History and Physical Interval Note:  11/08/2016 10:53 AM  Renee [name]  has presented today for surgery, with the diagnosis of sick sinus syndrome  The various methods of treatment have been discussed with the patient and family. After consideration of risks, benefits and other options for treatment, the patient has consented to  Procedure(s): PACEMAKER IMPLANT (N/A) as a surgical intervention .  The patient's history has been reviewed, patient examined, no change in status, stable for surgery.  I have reviewed the patient's chart and labs.  Questions were answered to the patient's satisfaction.     Thompson Grayer

## 2016-11-08 NOTE — Progress Notes (Signed)
Dr Rayann Heman notified of B/P's and no new orders noted

## 2016-11-08 NOTE — Discharge Summary (Signed)
ELECTROPHYSIOLOGY PROCEDURE DISCHARGE SUMMARY    Patient ID: Renee [name],  MRN: [id number], DOB/AGE: Oct 18, 1955 61 y.o.  Admit date: 11/08/2016 Discharge date: 11/09/2016  Primary Care Physician: Leonel Ramsay, MD Primary Cardiologist: [name] Electrophysiologist: Tabita Corbo  Primary Discharge Diagnosis:  Symptomatic bradycardia status post pacemaker implantation this admission  Secondary Discharge Diagnosis:  1.  Persistent atrial fibrillation 2.  Prior CVA 3.  HTN 4.  Hyperlipidemia  Allergies  Allergen Reactions  . Nsaids Other (See Comments)    dyspepsia  . 2,4-D Dimethylamine (Amisol) Nausea Only  . Ace Inhibitors Other (See Comments)    Cramps and cough  . Other Nausea Only and Other (See Comments)    AMISOL-nausea   . Telmisartan Other (See Comments)    unspecified  . Codeine Nausea Only    Other reaction(s): Hallucination  . Prednisone Palpitations and Other (See Comments)    tachycardia tachycardia     Procedures This Admission:  1.  Implantation of a STJ dual chamber PPM on 11/08/16 by Dr Rayann Heman.  See op note for full details. There were no immediate post procedure complications. 2.  CXR at discharge demonstrated no pneumothorax status post device implantation.   Brief HPI: Renee [name] is a 61 y.o. female was referred to electrophysiology in the outpatient setting for consideration of PPM implantation.  Past medical history includes persistent AF (maintaining SR on Tikosyn) and symptomatic sinus bradycardia.  The patient has had symptomatic bradycardia without reversible causes identified.  Risks, benefits, and alternatives to PPM implantation were reviewed with the patient who wished to proceed.   Hospital Course:  The patient was admitted and underwent implantation of a STJ dual chamber PPM with details as outlined above.  She  was monitored on telemetry which demonstrated atrial paced rhythm.  Left chest was without hematoma or  ecchymosis.  The device was interrogated and found to be functioning normally.  CXR was obtained and demonstrated no pneumothorax status post device implantation.  Wound care, arm mobility, and restrictions were reviewed with the patient.  The patient was examined and considered stable for discharge to home.    Physical Exam: Vitals:   11/08/16 1942 11/08/16 1945 11/08/16 2055 11/09/16 0425  BP: (!) 146/74 (!) 146/74 (!) 166/55 (!) 165/64  Pulse: 61   60  Resp: 20 16 19 13   Temp: (!) 97.5 F (36.4 C)   (!) 97.5 F (36.4 C)  TempSrc: Oral   Oral  SpO2: 96%   97%  Weight:      Height:        GEN- The patient is well appearing, alert and oriented x 3 today.   HEENT: normocephalic, atraumatic; sclera clear, conjunctiva pink; hearing intact; oropharynx clear; neck supple  Lungs- Clear to ausculation bilaterally, normal work of breathing.  No wheezes, rales, rhonchi Heart- Regular rate and rhythm, no murmurs, rubs or gallops  GI- soft, non-tender, non-distended, bowel sounds present  Extremities- no clubbing, cyanosis, or edema  MS- no significant deformity or atrophy Skin- warm and dry, no rash or lesion, left chest without hematoma/ecchymosis Psych- euthymic mood, full affect Neuro- strength and sensation are intact   Labs:   Lab Results  Component Value Date   WBC 7.6 11/02/2016   HGB 13.3 11/02/2016   HCT 39.0 11/02/2016   MCV 86 11/02/2016   PLT 293 11/02/2016     Recent Labs Lab 11/09/16 0537  NA 138  K 3.7  CL 101  CO2 27  BUN  11  CREATININE 0.72  CALCIUM 9.0  GLUCOSE 108*    Discharge Medications:  Allergies as of 11/09/2016      Reactions   Nsaids Other (See Comments)   dyspepsia   2,4-d Dimethylamine (amisol) Nausea Only   Ace Inhibitors Other (See Comments)   Cramps and cough   Other Nausea Only, Other (See Comments)   AMISOL-nausea    Telmisartan Other (See Comments)   unspecified   Codeine Nausea Only   Other reaction(s): Hallucination    Prednisone Palpitations, Other (See Comments)   tachycardia tachycardia      Medication List    TAKE these medications   acetaminophen 500 MG tablet Commonly known as:  TYLENOL Take 1,000 mg by mouth every 6 (six) hours as needed (pain).   ALPRAZolam 0.25 MG tablet Commonly known as:  XANAX Take 0.25 mg by mouth 3 (three) times daily as needed for anxiety.   apixaban 5 MG Tabs tablet Commonly known as:  ELIQUIS Take 1 tablet (5 mg total) by mouth 2 (two) times daily.   aspirin-acetaminophen-caffeine 250-250-65 MG tablet Commonly known as:  EXCEDRIN MIGRAINE Take 1 tablet by mouth every 6 (six) hours as needed for headache.   atorvastatin 40 MG tablet Commonly known as:  LIPITOR Take 1 tablet (40 mg total) by mouth daily at 6 PM.   Cinnamon 500 MG capsule Take 1,000 mg by mouth daily.   dofetilide 250 MCG capsule Commonly known as:  TIKOSYN Take 1 capsule (250 mcg total) by mouth every 12 (twelve) hours.   ferrous sulfate 325 (65 FE) MG tablet Take 325 mg by mouth daily.   FLUoxetine HCl 60 MG Tabs Take 60 mg by mouth daily.   Loratadine 10 MG Caps Take 10 mg by mouth every evening.   losartan 50 MG tablet Commonly known as:  COZAAR Take 1 tablet (50 mg total) by mouth daily.   Magnesium 250 MG Tabs Take 1 tablet (250 mg total) by mouth 2 (two) times daily.   multivitamin with minerals tablet Take 1 tablet by mouth daily.   omeprazole 40 MG capsule Commonly known as:  PRILOSEC Take 40 mg by mouth 2 (two) times daily.   Potassium Chloride ER 20 MEQ Tbcr Take 1 tablet by mouth 2 (two) times daily.   PREMARIN 0.625 MG tablet Generic drug:  estrogens (conjugated) Take 0.625 mg by mouth daily.   traZODone 100 MG tablet Commonly known as:  DESYREL Take 50-100 mg by mouth at bedtime as needed for sleep. sleep   Vitamin D3 1000 units Caps Take 1,000 Units by mouth daily.       Disposition:   Follow-up Information    [place] Office  Follow up on 11/19/2016.   Specialty:  Cardiology Why:  at 2:30PM  Contact information: [address], [place] [phone]       Thompson Grayer, MD Follow up on 02/08/2017.   Specialty:  Cardiology Why:  at 2:15PM  Contact information: [place] [address] [phone]           Duration of Discharge Encounter: Greater than 30 minutes including physician time.  Army Fossa MD 11/09/2016 7:35 AM

## 2016-11-08 NOTE — Care Management Note (Addendum)
Case Management Note  Patient Details  Name: Renee [name] MRN: [id number] Date of Birth: Sep 15, 1955  Subjective/Objective:   From home with spouse , son and daughter who can assist her at home at discharge if needed,pta indep, s/p pacemaker implant, she was previously on eliquis and tykison.  No needs at this time.               Action/Plan: NCM will follow for dc needs.   Expected Discharge Date:                  Expected Discharge Plan:  Home/Self Care  In-House Referral:     Discharge planning Services  CM Consult  Post Acute Care Choice:    Choice offered to:     DME Arranged:    DME Agency:     HH Arranged:    [place] Agency:     Status of Service:  Completed, signed off  If discussed at [employer] of Stay Meetings, dates discussed:    Additional Comments:  Zenon Mayo, RN 11/08/2016, 3:17 PM

## 2016-11-09 ENCOUNTER — Ambulatory Visit (HOSPITAL_COMMUNITY): Payer: Managed Care, Other (non HMO)

## 2016-11-09 DIAGNOSIS — I495 Sick sinus syndrome: Secondary | ICD-10-CM

## 2016-11-09 LAB — BASIC METABOLIC PANEL
Anion gap: 10 (ref 5–15)
BUN: 11 mg/dL (ref 6–20)
CHLORIDE: 101 mmol/L (ref 101–111)
CO2: 27 mmol/L (ref 22–32)
CREATININE: 0.72 mg/dL (ref 0.44–1.00)
Calcium: 9 mg/dL (ref 8.9–10.3)
GFR calc non Af Amer: >60 mL/min (ref >60)
GLUCOSE: 108 mg/dL — AB (ref 65–99)
POTASSIUM: 3.7 mmol/L (ref 3.5–5.1)
SODIUM: 138 mmol/L (ref 135–145)

## 2016-11-09 LAB — MAGNESIUM: Magnesium: 1.7 mg/dL (ref 1.7–2.4)

## 2016-11-09 NOTE — Discharge Instructions (Signed)
° ° °  Supplemental Discharge Instructions for  Pacemaker  Patients  Activity No heavy lifting or vigorous activity with your left/right arm for 6 to 8 weeks.  Do not raise your left/right arm above your head for one week.  Gradually raise your affected arm as drawn below.           __         11/12/16               11/13/16                11/14/16                  11/15/16  NO DRIVING for 1 week    ; you may begin driving on  26/94/85   .  WOUND CARE - Keep the wound area clean and dry.  Do not get this area wet for one week. No showers for one week; you may shower on    11/15/16 . - The tape/steri-strips on your wound will fall off; do not pull them off.  No bandage is needed on the site.  DO  NOT apply any creams, oils, or ointments to the wound area. - If you notice any drainage or discharge from the wound, any swelling or bruising at the site, or you develop a fever > 101? F after you are discharged home, call the office at once.  Special Instructions - You are still able to use cellular telephones; use the ear opposite the side where you have your pacemaker.  Avoid carrying your cellular phone near your device. - When traveling through airports, show security personnel your identification card to avoid being screened in the metal detectors.  Ask the security personnel to use the hand wand. - Avoid arc welding equipment, MRI testing (magnetic resonance imaging), TENS units (transcutaneous nerve stimulators).  Call the office for questions about other devices. - Avoid electrical appliances that are in poor condition or are not properly grounded. - Microwave ovens are safe to be near or to operate.

## 2016-11-14 ENCOUNTER — Other Ambulatory Visit (HOSPITAL_COMMUNITY): Payer: Self-pay | Admitting: Nurse Practitioner

## 2016-11-19 ENCOUNTER — Encounter: Payer: Self-pay | Admitting: *Deleted

## 2016-11-19 ENCOUNTER — Ambulatory Visit (INDEPENDENT_AMBULATORY_CARE_PROVIDER_SITE_OTHER): Payer: Managed Care, Other (non HMO) | Admitting: *Deleted

## 2016-11-19 DIAGNOSIS — Z950 Presence of cardiac pacemaker: Secondary | ICD-10-CM | POA: Diagnosis not present

## 2016-11-19 DIAGNOSIS — I495 Sick sinus syndrome: Secondary | ICD-10-CM

## 2016-11-19 DIAGNOSIS — I480 Paroxysmal atrial fibrillation: Secondary | ICD-10-CM | POA: Diagnosis not present

## 2016-11-19 LAB — CUP PACEART INCLINIC DEVICE CHECK
Battery Remaining Longevity: 90 mo
Battery Voltage: 3.05 V
Implantable Lead Implant Date: 20181018
Implantable Lead Location: 753859
Implantable Lead Location: 753860
Implantable Pulse Generator Implant Date: 20181018
Lead Channel Impedance Value: 587.5 Ohm
Lead Channel Impedance Value: 650 Ohm
Lead Channel Pacing Threshold Amplitude: 0.75 V
Lead Channel Pacing Threshold Pulse Width: 0.5 ms
Lead Channel Sensing Intrinsic Amplitude: 12 mV
Lead Channel Setting Pacing Amplitude: 0.875
Lead Channel Setting Pacing Amplitude: 3.5 V
Lead Channel Setting Sensing Sensitivity: 2 mV
MDC IDC LEAD IMPLANT DT: 20181018
MDC IDC MSMT LEADCHNL RA PACING THRESHOLD AMPLITUDE: 0.5 V
MDC IDC MSMT LEADCHNL RA PACING THRESHOLD PULSEWIDTH: 0.5 ms
MDC IDC MSMT LEADCHNL RA SENSING INTR AMPL: 5 mV
MDC IDC SESS DTM: 20181029172725
MDC IDC SET LEADCHNL RV PACING PULSEWIDTH: 0.5 ms
MDC IDC STAT BRADY RA PERCENT PACED: 89 %
MDC IDC STAT BRADY RV PERCENT PACED: 0.82 %
Pulse Gen Serial Number: 8957639

## 2016-11-19 NOTE — Progress Notes (Signed)
Wound check appointment. Steri-strips removed. Wound without redness or edema. Incision edges approximated, wound well healed. Normal device function. Thresholds, sensing, and impedances consistent with implant measurements. Device programmed at 3.5V (RA) with auto capture programmed on (RV) for extra safety margin until 3 month visit. Histogram distribution appropriate for patient and level of activity. No mode switches or high ventricular rates noted. Patient educated about wound care, arm mobility, lifting restrictions, and Merlin monitor. ROV with JA on 02/08/17.  At her request, patient given letter clearing her to receive a massage (OK per Dr. Rayann Heman).

## 2016-12-03 ENCOUNTER — Ambulatory Visit: Payer: Managed Care, Other (non HMO) | Admitting: Internal Medicine

## 2016-12-28 ENCOUNTER — Telehealth: Payer: Self-pay | Admitting: Internal Medicine

## 2016-12-28 ENCOUNTER — Other Ambulatory Visit: Payer: Self-pay | Admitting: *Deleted

## 2016-12-28 MED ORDER — DOFETILIDE 250 MCG PO CAPS: 250.0000 ug | ORAL_CAPSULE | Freq: Two times a day (BID) | ORAL | 9 refills | Status: DC

## 2016-12-28 NOTE — Telephone Encounter (Signed)
New message       *STAT* If patient is at the pharmacy, call can be transferred to refill team.   1. Which medications need to be refilled? (please list name of each medication and dose if known) tikosyn  2. Which pharmacy/location (including street and city if local pharmacy) is medication to be sent to?  [place] on [address]   3. Do they need a 30 day or 90 day supply?  [place]

## 2017-01-08 ENCOUNTER — Other Ambulatory Visit: Payer: Self-pay | Admitting: Obstetrics & Gynecology

## 2017-01-08 DIAGNOSIS — Z1231 Encounter for screening mammogram for malignant neoplasm of breast: Secondary | ICD-10-CM

## 2017-02-01 ENCOUNTER — Ambulatory Visit
Admission: RE | Admit: 2017-02-01 | Discharge: 2017-02-01 | Disposition: A | Discharge status: Home / Self Care | Payer: Managed Care, Other (non HMO) | Source: Ambulatory Visit | Attending: Obstetrics & Gynecology | Admitting: Obstetrics & Gynecology

## 2017-02-01 DIAGNOSIS — Z1231 Encounter for screening mammogram for malignant neoplasm of breast: Secondary | ICD-10-CM

## 2017-02-08 ENCOUNTER — Encounter: Payer: Self-pay | Admitting: Internal Medicine

## 2017-02-08 ENCOUNTER — Ambulatory Visit: Payer: Managed Care, Other (non HMO) | Admitting: Internal Medicine

## 2017-02-08 VITALS — BP 140/82 | HR 69 | Ht 68.0 in | Wt 241.2 lb

## 2017-02-08 DIAGNOSIS — I480 Paroxysmal atrial fibrillation: Secondary | ICD-10-CM

## 2017-02-08 DIAGNOSIS — I495 Sick sinus syndrome: Secondary | ICD-10-CM | POA: Diagnosis not present

## 2017-02-08 DIAGNOSIS — Z950 Presence of cardiac pacemaker: Secondary | ICD-10-CM | POA: Diagnosis not present

## 2017-02-08 NOTE — Patient Instructions (Addendum)
Medication Instructions:  Your physician recommends that you continue on your current medications as directed. Please refer to the Current Medication list given to you today.   Labwork: Your physician recommends that you return for lab work today: BMP/MAG   Testing/Procedures: None ordered   Follow-Up: Your physician recommends that you schedule a follow-up appointment in: 3 months with Chanetta Marshall, NP   Remote monitoring is used to monitor your Pacemaker from home. This monitoring reduces the number of office visits required to check your device to one time per year. It allows [place] to keep an eye on the functioning of your device to ensure it is working properly. You are scheduled for a device check from home on 05/13/17. You may send your transmission at any time that day. If you have a wireless device, the transmission will be sent automatically. After your physician reviews your transmission, you will receive a postcard with your next transmission date.
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
## 2017-02-09 LAB — BASIC METABOLIC PANEL
BUN/Creatinine Ratio: 14 (ref 9–23)
BUN: 9 mg/dL (ref 6–24)
CHLORIDE: 103 mmol/L (ref 96–106)
CO2: 25 mmol/L (ref 20–29)
Calcium: 9.6 mg/dL (ref 8.7–10.2)
Creatinine, Ser: 0.65 mg/dL (ref 0.57–1.00)
GFR, EST AFRICAN AMERICAN: 112 mL/min/{1.73_m2} (ref >59)
GFR, EST NON AFRICAN AMERICAN: 97 mL/min/{1.73_m2} (ref >59)
Glucose: 72 mg/dL (ref 65–99)
POTASSIUM: 4.5 mmol/L (ref 3.5–5.2)
SODIUM: 140 mmol/L (ref 134–144)

## 2017-02-09 LAB — MAGNESIUM: Magnesium: 2.3 mg/dL (ref 1.6–2.3)

## 2017-02-19 LAB — CUP PACEART INCLINIC DEVICE CHECK
Battery Remaining Longevity: 126 mo
Battery Voltage: 3.01 V
Brady Statistic RA Percent Paced: 66 %
Brady Statistic RV Percent Paced: 0.92 %
Date Time Interrogation Session: 20190118204834
Implantable Lead Implant Date: 20181018
Implantable Lead Location: 753859
Lead Channel Impedance Value: 512.5 Ohm
Lead Channel Pacing Threshold Amplitude: 0.5 V
Lead Channel Pacing Threshold Pulse Width: 0.5 ms
Lead Channel Sensing Intrinsic Amplitude: 5 mV
Lead Channel Setting Pacing Amplitude: 2 V
Lead Channel Setting Pacing Pulse Width: 0.5 ms
Lead Channel Setting Sensing Sensitivity: 2 mV
MDC IDC LEAD IMPLANT DT: 20181018
MDC IDC LEAD LOCATION: 753860
MDC IDC MSMT LEADCHNL RA PACING THRESHOLD PULSEWIDTH: 0.5 ms
MDC IDC MSMT LEADCHNL RV IMPEDANCE VALUE: 575 Ohm
MDC IDC MSMT LEADCHNL RV PACING THRESHOLD AMPLITUDE: 0.75 V
MDC IDC MSMT LEADCHNL RV SENSING INTR AMPL: 12 mV
MDC IDC PG IMPLANT DT: 20181018
MDC IDC PG SERIAL: 8957639
MDC IDC SET LEADCHNL RV PACING AMPLITUDE: 0.875
Pulse Gen Model: 2272
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
## 2017-04-25 ENCOUNTER — Ambulatory Visit: Payer: Managed Care, Other (non HMO) | Admitting: Nurse Practitioner

## 2017-04-25 ENCOUNTER — Encounter: Payer: Self-pay | Admitting: Nurse Practitioner

## 2017-04-25 ENCOUNTER — Encounter (INDEPENDENT_AMBULATORY_CARE_PROVIDER_SITE_OTHER): Payer: Self-pay

## 2017-04-25 VITALS — BP 168/88 | HR 65 | Ht 68.0 in | Wt 247.0 lb

## 2017-04-25 DIAGNOSIS — I495 Sick sinus syndrome: Secondary | ICD-10-CM

## 2017-04-25 DIAGNOSIS — I480 Paroxysmal atrial fibrillation: Secondary | ICD-10-CM | POA: Diagnosis not present

## 2017-04-25 LAB — CUP PACEART INCLINIC DEVICE CHECK
Implantable Lead Implant Date: 20181018
Implantable Lead Location: 753860
Implantable Pulse Generator Implant Date: 20181018
MDC IDC LEAD IMPLANT DT: 20181018
MDC IDC LEAD LOCATION: 753859
MDC IDC SESS DTM: 20190404103912
Pulse Gen Serial Number: 8957639

## 2017-04-25 LAB — BASIC METABOLIC PANEL
BUN / CREAT RATIO: 11 (ref 9–23)
BUN: 10 mg/dL (ref 6–24)
CHLORIDE: 103 mmol/L (ref 96–106)
CO2: 24 mmol/L (ref 20–29)
CREATININE: 0.89 mg/dL (ref 0.57–1.00)
Calcium: 9.6 mg/dL (ref 8.7–10.2)
GFR calc Af Amer: 82 mL/min/{1.73_m2} (ref >59)
GFR calc non Af Amer: 71 mL/min/{1.73_m2} (ref >59)
GLUCOSE: 115 mg/dL — AB (ref 65–99)
Potassium: 4 mmol/L (ref 3.5–5.2)
Sodium: 140 mmol/L (ref 134–144)

## 2017-04-25 LAB — MAGNESIUM: MAGNESIUM: 1.9 mg/dL (ref 1.6–2.3)

## 2017-04-25 NOTE — Patient Instructions (Addendum)
Medication Instructions:   Your physician recommends that you continue on your current medications as directed. Please refer to the Current Medication list given to you today.   If you need a refill on your cardiac medications before your next appointment, please call your pharmacy.  Labwork: BMET AND MAG TODAY    Testing/Procedures: NONE ORDERED  TODAY    Follow-Up:  3 MONTHS ALLRED    Any Other Special Instructions Will Be Listed Below (If Applicable).

## 2017-05-09 NOTE — Addendum Note (Signed)
Addended by: Sandrea Hammond D on: 05/09/2017 03:01 PM   Modules accepted: Orders

## 2017-05-13 ENCOUNTER — Ambulatory Visit (INDEPENDENT_AMBULATORY_CARE_PROVIDER_SITE_OTHER): Payer: Managed Care, Other (non HMO) | Admitting: *Deleted

## 2017-05-13 DIAGNOSIS — I495 Sick sinus syndrome: Secondary | ICD-10-CM

## 2017-05-13 NOTE — Progress Notes (Signed)
Remote pacemaker transmission.   

## 2017-05-14 ENCOUNTER — Encounter: Payer: Self-pay | Admitting: Cardiology

## 2017-05-14 LAB — CUP PACEART REMOTE DEVICE CHECK
Implantable Lead Implant Date: 20181018
Implantable Lead Location: 753860
Implantable Pulse Generator Implant Date: 20181018
MDC IDC LEAD IMPLANT DT: 20181018
MDC IDC LEAD LOCATION: 753859
MDC IDC SESS DTM: 20190423131446
Pulse Gen Serial Number: 8957639

## 2017-07-05 ENCOUNTER — Other Ambulatory Visit
Admission: RE | Admit: 2017-07-05 | Discharge: 2017-07-05 | Disposition: A | Discharge status: Home / Self Care | Payer: Managed Care, Other (non HMO) | Source: Ambulatory Visit | Attending: Infectious Diseases | Admitting: Infectious Diseases

## 2017-07-05 DIAGNOSIS — R197 Diarrhea, unspecified: Secondary | ICD-10-CM | POA: Diagnosis present

## 2017-07-05 DIAGNOSIS — R1110 Vomiting, unspecified: Secondary | ICD-10-CM | POA: Insufficient documentation

## 2017-07-05 DIAGNOSIS — R531 Weakness: Secondary | ICD-10-CM | POA: Insufficient documentation

## 2017-07-11 LAB — MISC LABCORP TEST (SEND OUT): Labcorp test code: 8300

## 2017-07-19 ENCOUNTER — Encounter: Payer: Self-pay | Admitting: Internal Medicine

## 2017-07-29 ENCOUNTER — Encounter: Payer: Self-pay | Admitting: Internal Medicine

## 2017-07-29 ENCOUNTER — Ambulatory Visit: Payer: Managed Care, Other (non HMO) | Admitting: Internal Medicine

## 2017-07-29 VITALS — BP 130/82 | HR 66 | Ht 68.0 in | Wt 242.0 lb

## 2017-07-29 DIAGNOSIS — I480 Paroxysmal atrial fibrillation: Secondary | ICD-10-CM

## 2017-07-29 DIAGNOSIS — Z950 Presence of cardiac pacemaker: Secondary | ICD-10-CM | POA: Diagnosis not present

## 2017-07-29 DIAGNOSIS — I495 Sick sinus syndrome: Secondary | ICD-10-CM | POA: Diagnosis not present

## 2017-07-29 NOTE — Patient Instructions (Addendum)
Medication Instructions:  Your physician recommends that you continue on your current medications as directed. Please refer to the Current Medication list given to you today.  Labwork: None ordered.  Testing/Procedures: None ordered.  Follow-Up: Your physician wants you to follow-up in:  6 months with Chanetta Marshall, NP.   You will receive a reminder letter in the mail two months in advance. If you don't receive a letter, please call our office to schedule the follow-up appointment.  Remote monitoring is used to monitor your Pacemaker from home. This monitoring reduces the number of office visits required to check your device to one time per year. It allows [place] to keep an eye on the functioning of your device to ensure it is working properly. You are scheduled for a device check from home on 08/12/2017. You may send your transmission at any time that day. If you have a wireless device, the transmission will be sent automatically. After your physician reviews your transmission, you will receive a postcard with your next transmission date.  Any Other Special Instructions Will Be Listed Below (If Applicable).  If you need a refill on your cardiac medications before your next appointment, please call your pharmacy.
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
## 2017-08-10 LAB — CUP PACEART INCLINIC DEVICE CHECK
Brady Statistic RA Percent Paced: 60 %
Brady Statistic RV Percent Paced: 0 %
Date Time Interrogation Session: 20190708161607
Implantable Lead Implant Date: 20181018
Implantable Lead Location: 753859
Lead Channel Impedance Value: 462.5 Ohm
Lead Channel Setting Pacing Amplitude: 2 V
MDC IDC LEAD IMPLANT DT: 20181018
MDC IDC LEAD LOCATION: 753860
MDC IDC MSMT BATTERY REMAINING LONGEVITY: 122 mo
MDC IDC MSMT BATTERY VOLTAGE: 3.02 V
MDC IDC MSMT LEADCHNL RA PACING THRESHOLD AMPLITUDE: 0.75 V
MDC IDC MSMT LEADCHNL RA PACING THRESHOLD PULSEWIDTH: 0.5 ms
MDC IDC MSMT LEADCHNL RA SENSING INTR AMPL: 5 mV
MDC IDC PG IMPLANT DT: 20181018
Pulse Gen Model: 2272
Pulse Gen Serial Number: 8957639

## 2017-08-12 ENCOUNTER — Ambulatory Visit (INDEPENDENT_AMBULATORY_CARE_PROVIDER_SITE_OTHER): Payer: Managed Care, Other (non HMO) | Admitting: *Deleted

## 2017-08-12 DIAGNOSIS — I495 Sick sinus syndrome: Secondary | ICD-10-CM | POA: Diagnosis not present

## 2017-08-12 NOTE — Progress Notes (Signed)
Remote pacemaker transmission.   

## 2017-08-13 ENCOUNTER — Encounter: Payer: Self-pay | Admitting: Cardiology

## 2017-09-08 LAB — CUP PACEART REMOTE DEVICE CHECK
Battery Remaining Longevity: 124 mo
Battery Remaining Percentage: 95.5 %
Battery Voltage: 3.02 V
Date Time Interrogation Session: 20190722080302
Implantable Lead Implant Date: 20181018
Implantable Lead Location: 753859
Lead Channel Impedance Value: 450 Ohm
Lead Channel Setting Pacing Amplitude: 2 V
MDC IDC LEAD IMPLANT DT: 20181018
MDC IDC LEAD LOCATION: 753860
MDC IDC MSMT LEADCHNL RA PACING THRESHOLD AMPLITUDE: 0.75 V
MDC IDC MSMT LEADCHNL RA PACING THRESHOLD PULSEWIDTH: 0.5 ms
MDC IDC MSMT LEADCHNL RA SENSING INTR AMPL: 5 mV
MDC IDC PG IMPLANT DT: 20181018
MDC IDC PG SERIAL: 8957639
MDC IDC STAT BRADY RA PERCENT PACED: 51 %
Pulse Gen Model: 2272

## 2017-09-21 IMAGING — CT CT CHEST W/ CM
2 of 3 series · 17 of 46 positions shown, 19 images · IV contrast (omnipaque)
Comparison: P and lateral chest 04/06/2015 and 07/03/2014.

CLINICAL DATA: Chest pain and pressure for 2 days. Cough. Initial
encounter.

EXAM:
CT CHEST WITH CONTRAST
TECHNIQUE: Multidetector CT imaging of the chest was performed during
intravenous contrast administration.
CONTRAST:  75 ml OMNIPAQUE IOHEXOL 300 MG/ML  SOLN

[Series 2: routine chest with · axial · 0.65mm/px · z∈[-413,-123]mm · 14 of 68 slices shown, 16 images]
[im 5/68  soft-tissue]
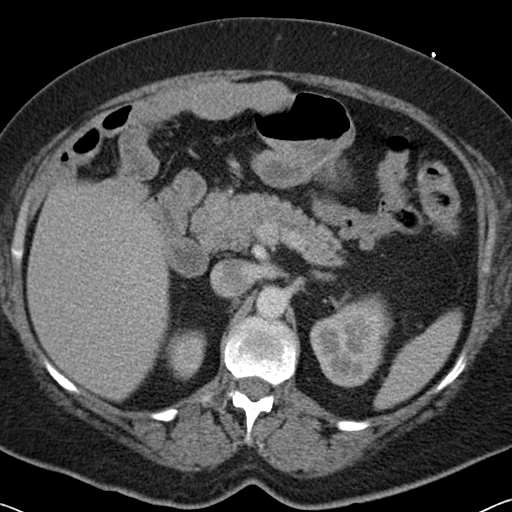
[im 5/68  bone]
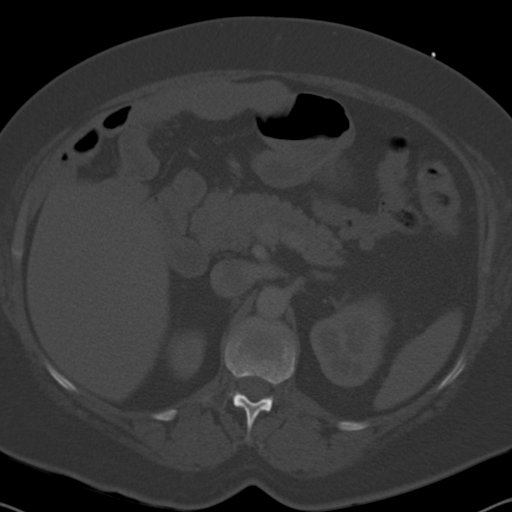
[im 9/68  soft-tissue]
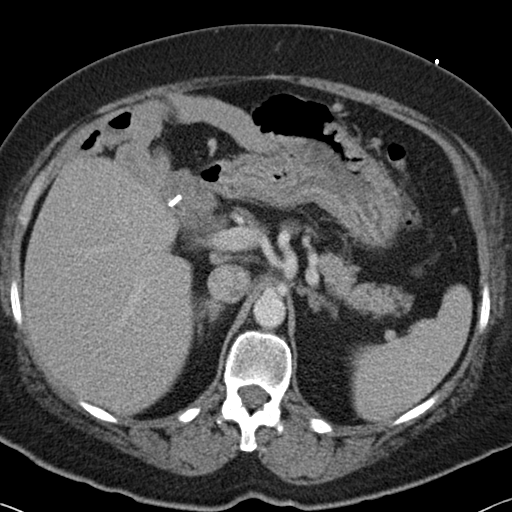
[im 13/68  soft-tissue]
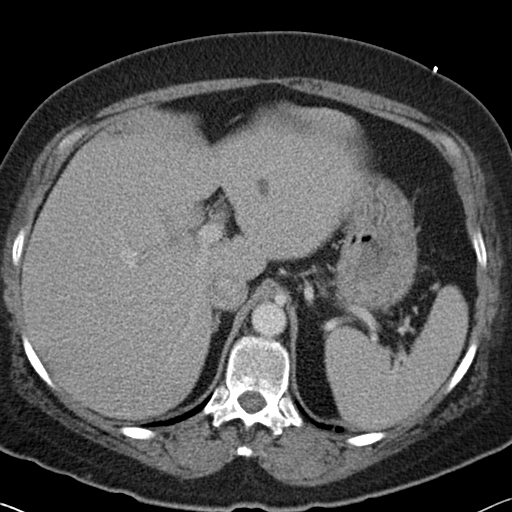
[im 18/68  soft-tissue]
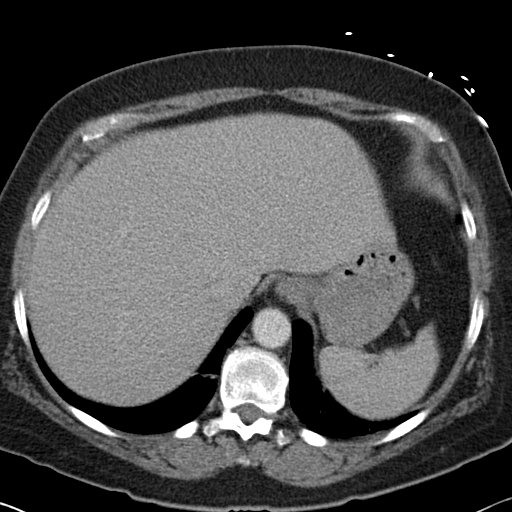
[im 22/68  soft-tissue]
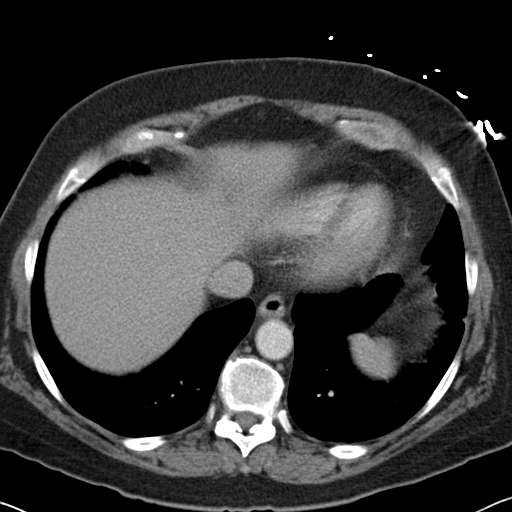
[im 26/68  soft-tissue]
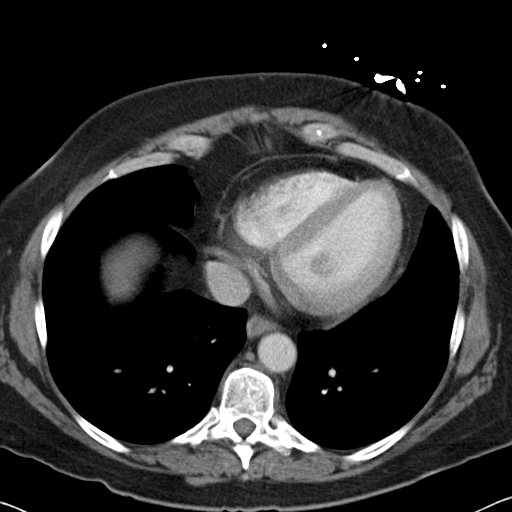
[im 31/68  soft-tissue]
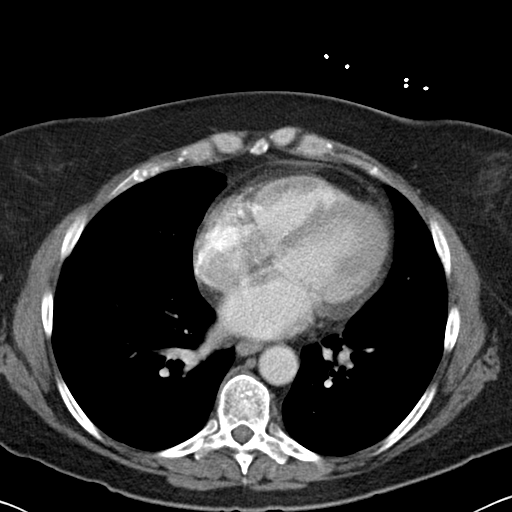
[im 37/68  soft-tissue]
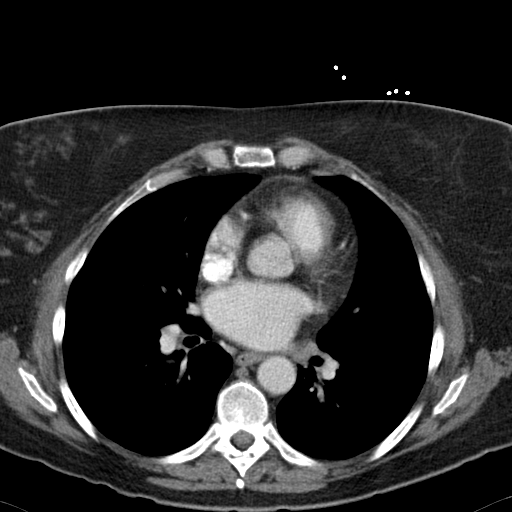
[im 42/68  soft-tissue]
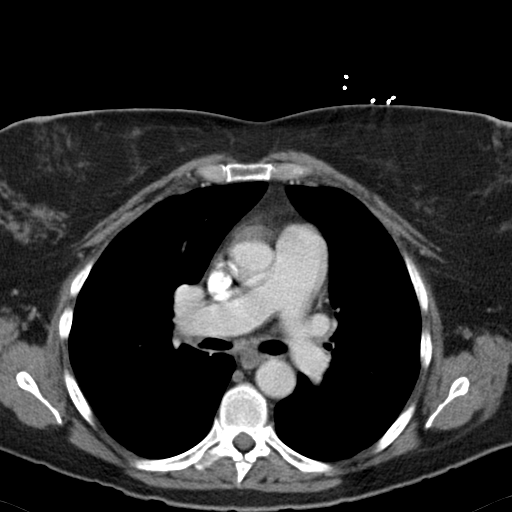
[im 42/68  bone]
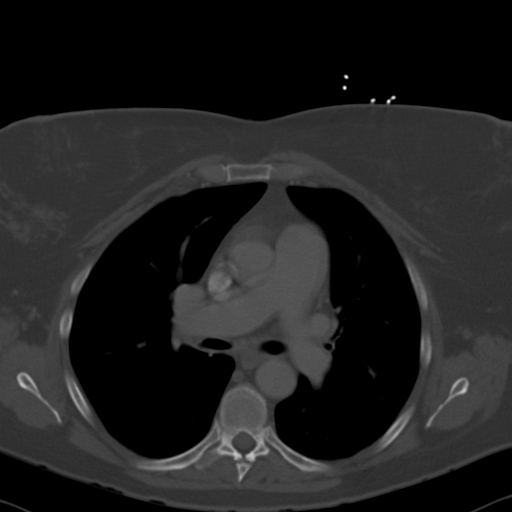
[im 46/68  soft-tissue]
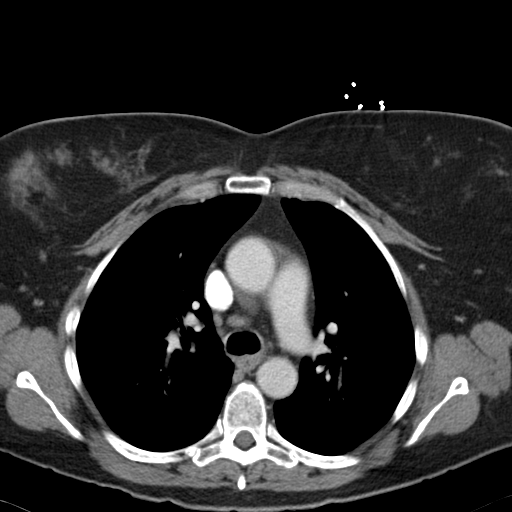
[im 50/68  soft-tissue]
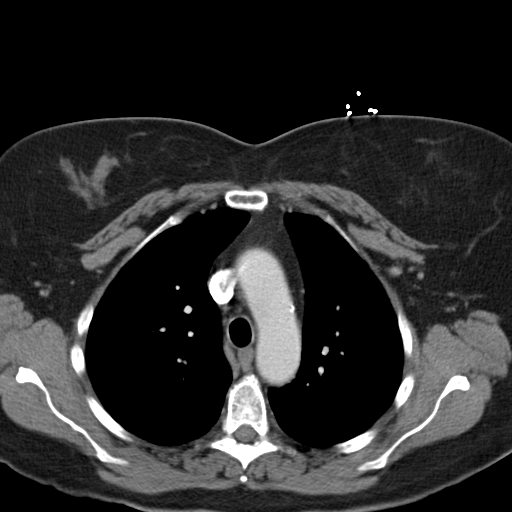
[im 55/68  soft-tissue]
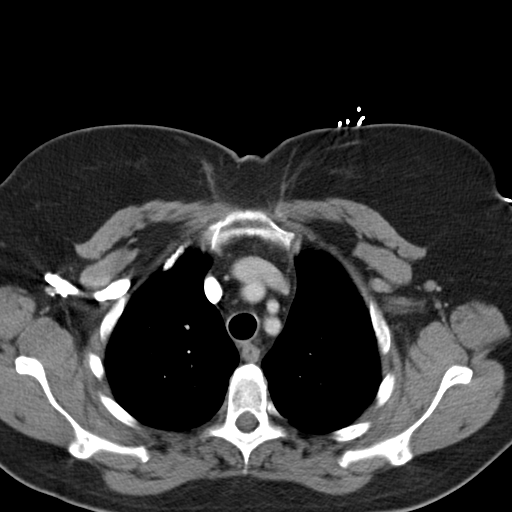
[im 59/68  soft-tissue]
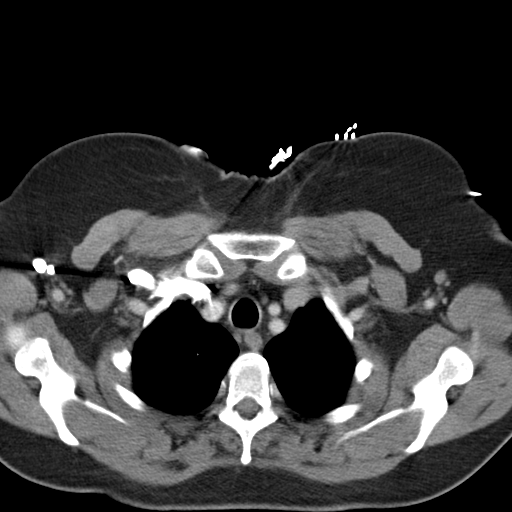
[im 63/68  soft-tissue]
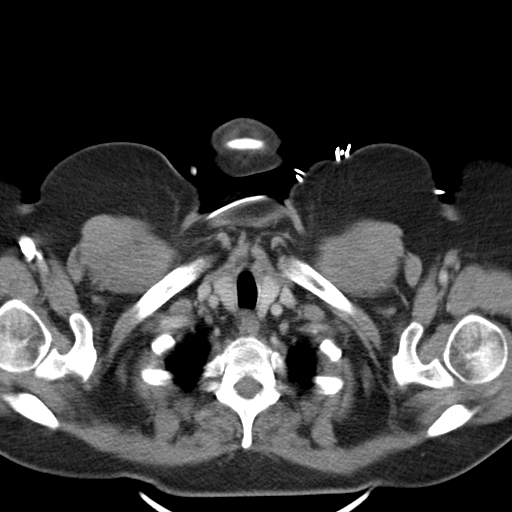

[Series 5: routine chest with cor · coronal · 0.64mm/px · 3 of 150 slices shown]
[im 50/150  soft-tissue]
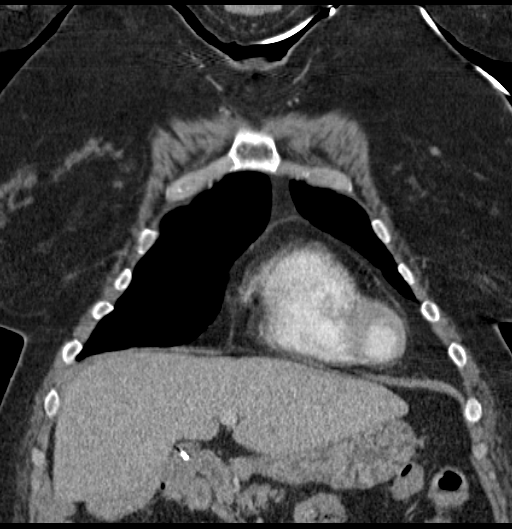
[im 67/150  soft-tissue]
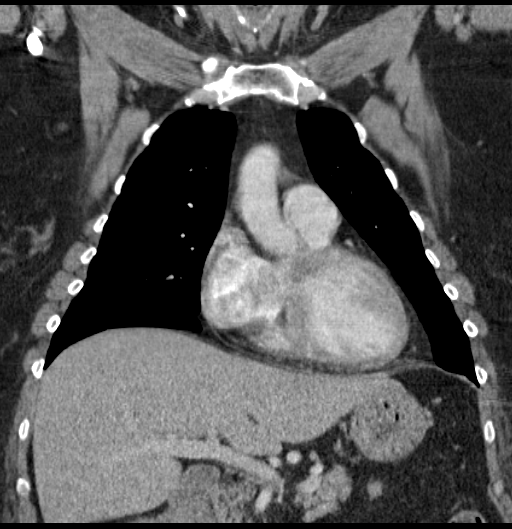
[im 83/150  soft-tissue]
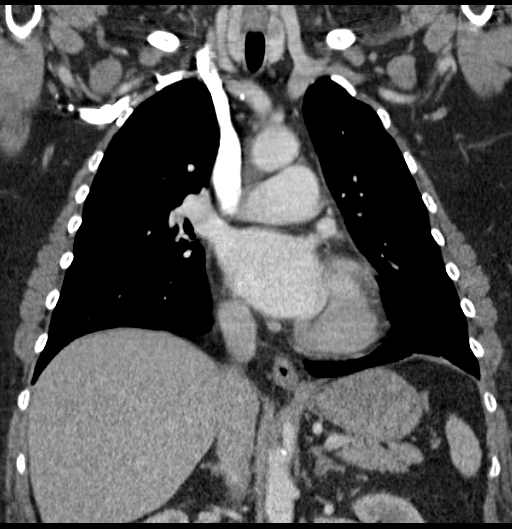

[17 of 46 positions shown; findings below may reference images not displayed]

FINDINGS: There is no axillary, hilar or mediastinal lymphadenopathy. Heart
size is mildly enlarged. New no pleural or pericardial effusion.
There is some calcific aortic atherosclerosis. Lungs demonstrate
centrilobular emphysematous change a 0.4 cm ground-glass attenuating
nodule is seen in the right upper lobe on image 32 and incidentally
noted. The lungs otherwise demonstrate mild dependent atelectasis.

Upper abdomen shows a 0.8 cm hypoattenuating lesion left hepatic
lobe compatible with a cyst. The patient is status post
cholecystectomy. Imaged upper abdomen is otherwise unremarkable. No
focal bony abnormality is identified.
IMPRESSION: No acute abnormality or finding to explain the patient's symptoms.

Emphysema.

## 2017-11-11 ENCOUNTER — Telehealth: Payer: Self-pay

## 2017-11-11 ENCOUNTER — Ambulatory Visit (INDEPENDENT_AMBULATORY_CARE_PROVIDER_SITE_OTHER): Payer: Managed Care, Other (non HMO) | Admitting: *Deleted

## 2017-11-11 DIAGNOSIS — I495 Sick sinus syndrome: Secondary | ICD-10-CM

## 2017-11-11 NOTE — Telephone Encounter (Signed)
I spoke with the patient about this.

## 2017-11-12 ENCOUNTER — Encounter: Payer: Self-pay | Admitting: Cardiology

## 2017-11-12 NOTE — Progress Notes (Signed)
Remote pacemaker transmission.   

## 2017-11-17 ENCOUNTER — Other Ambulatory Visit: Payer: Self-pay | Admitting: Cardiology

## 2017-11-19 ENCOUNTER — Other Ambulatory Visit: Payer: Self-pay | Admitting: *Deleted

## 2017-11-19 NOTE — Telephone Encounter (Signed)
Patient called and requested refills on tikosyn and potassium. Dr Rayann Heman has never refilled these for the patient. She reports that her rx for potassium is 10 meq with a sig of take two tablets bid as she had difficulty swallowing the 20 meq tablets. Okay to refill? Please advise. Thanks, MI

## 2017-11-20 MED ORDER — POTASSIUM CHLORIDE ER 10 MEQ PO TBCR: 20.0000 meq | EXTENDED_RELEASE_TABLET | Freq: Two times a day (BID) | ORAL | 3 refills | Status: DC

## 2017-11-20 MED ORDER — DOFETILIDE 250 MCG PO CAPS: 250.0000 ug | ORAL_CAPSULE | Freq: Two times a day (BID) | ORAL | 9 refills | Status: DC

## 2017-12-27 ENCOUNTER — Other Ambulatory Visit: Payer: Self-pay | Admitting: Obstetrics & Gynecology

## 2017-12-27 ENCOUNTER — Other Ambulatory Visit: Payer: Self-pay | Admitting: Internal Medicine

## 2017-12-27 DIAGNOSIS — Z1231 Encounter for screening mammogram for malignant neoplasm of breast: Secondary | ICD-10-CM

## 2017-12-30 ENCOUNTER — Other Ambulatory Visit: Payer: Self-pay | Admitting: Obstetrics & Gynecology

## 2017-12-30 DIAGNOSIS — R140 Abdominal distension (gaseous): Secondary | ICD-10-CM

## 2018-01-03 ENCOUNTER — Ambulatory Visit
Admission: RE | Admit: 2018-01-03 | Discharge: 2018-01-03 | Disposition: A | Discharge status: Home / Self Care | Payer: Managed Care, Other (non HMO) | Source: Ambulatory Visit | Attending: Obstetrics & Gynecology | Admitting: Obstetrics & Gynecology

## 2018-01-03 DIAGNOSIS — R140 Abdominal distension (gaseous): Secondary | ICD-10-CM

## 2018-01-03 LAB — POCT I-STAT CREATININE: CREATININE: 0.8 mg/dL (ref 0.44–1.00)

## 2018-01-03 MED ORDER — IOPAMIDOL (ISOVUE-300) INJECTION 61%
100.0000 mL | Freq: Once | INTRAVENOUS | Status: AC | PRN
  Administered 2018-01-03: 100 mL via INTRAVENOUS

## 2018-01-12 LAB — CUP PACEART REMOTE DEVICE CHECK
Brady Statistic RA Percent Paced: 63 %
Implantable Lead Implant Date: 20181018
Implantable Lead Implant Date: 20181018
Implantable Lead Location: 753860
Implantable Pulse Generator Implant Date: 20181018
Lead Channel Pacing Threshold Amplitude: 0.75 V
Lead Channel Sensing Intrinsic Amplitude: 5 mV
Lead Channel Setting Pacing Amplitude: 2 V
MDC IDC LEAD LOCATION: 753859
MDC IDC MSMT BATTERY REMAINING LONGEVITY: 122 mo
MDC IDC MSMT BATTERY REMAINING PERCENTAGE: 95.5 %
MDC IDC MSMT BATTERY VOLTAGE: 3.01 V
MDC IDC MSMT LEADCHNL RA IMPEDANCE VALUE: 460 Ohm
MDC IDC MSMT LEADCHNL RA PACING THRESHOLD PULSEWIDTH: 0.5 ms
MDC IDC SESS DTM: 20191022055013
Pulse Gen Serial Number: 8957639

## 2018-01-23 ENCOUNTER — Encounter: Payer: Managed Care, Other (non HMO) | Admitting: Nurse Practitioner

## 2018-01-30 ENCOUNTER — Telehealth: Payer: Self-pay | Admitting: *Deleted

## 2018-01-30 NOTE — Telephone Encounter (Signed)
   [facility] Pre-operative Risk Assessment    Request for surgical clearance:  1. What type of surgery is being performed? COLONOSCOPY/ENDOSCOPY   2. When is this surgery scheduled? 03/19/18   3. What type of clearance is required (medical clearance vs. Pharmacy clearance to hold med vs. Both)? BOTH   4. Are there any medications that need to be held prior to surgery and how long?HOLD ELIQUIS 3 DAYS PRIOR   5. Practice name and name of physician performing surgery? [facility]; Denice Paradise, NP  6. What is your office phone number [phone]    7.   What is your office fax number [phone]  8.   Anesthesia type (None, local, MAC, general) ? [place] 01/30/2018, 11:23 AM  _________________________________________________________________   (provider comments below)

## 2018-02-03 ENCOUNTER — Ambulatory Visit
Admission: RE | Admit: 2018-02-03 | Discharge: 2018-02-03 | Disposition: A | Discharge status: Home / Self Care | Payer: Managed Care, Other (non HMO) | Source: Ambulatory Visit | Attending: Internal Medicine | Admitting: Internal Medicine

## 2018-02-03 DIAGNOSIS — Z1231 Encounter for screening mammogram for malignant neoplasm of breast: Secondary | ICD-10-CM | POA: Diagnosis present

## 2018-02-03 NOTE — Telephone Encounter (Signed)
   Primary Cardiologist: Dr. Rayann Heman (patient indicates she no longer sees Dr. [name])  Chart reviewed as part of pre-operative protocol coverage. Patient was contacted 02/03/2018 in reference to pre-operative risk assessment for pending surgery as outlined below.  Renee [name] was last seen on 07/2017 by Dr. Rayann Heman, felt to be doing well. She had cath in 2017 showing minimal CAD, done for CP/SOB. She also has hx of AF and PPM. On our conversation today she does report intermittent episodes palpitations ("galloping"). She also reports random intermittent chest discomfort, similar over the last 3 years. The chest discomfort is the same that she had that had prompted cath which was unrevealing. It occurs at random for a few minutes every few days. She is able to exert herself without any problem and is otherwise feeling like herself. No SOB, syncope, diaphoresis. Her BP has been running intermittently higher and she has been under increased emotional stress which she this is contributing. I do not see any acute appointments available in our office for BP f/u but encouraged her to contact her PCP to be seen. Encouraged her to call our main cardiology line if symptoms change/worsen or if she wants to be seen sooner. The patient has a f/u appointment with Chanetta Marshall on 02/20/17 which is well before date of colonoscopy. These symptoms can be evaluated at that time - Per office protocol, the provider should assess clearance at time of office visit and should forward their finalized clearance decision to requesting party below.   We are asked if patient can hold Eliquis for 3 days. However, she has history of stroke. Per clinic protocol, pharmacist states, "Pt takes Eliquis for afib with CHADS2VASc score of 5 (female, HTN, CAD, CVA). Renal function is normal. Recommend only holding Eliquis for 24 hours prior to procedure due to history of CVA." This will need to be relayed in the clearance as well.  I will  forward note to [facility] to make her aware of upcoming appt; also to determine if she thinks any further eval of PPM needs to happen before next visit. Will route this update to the requesting party below and remove from pre-op box.  Charlie Pitter, PA-C 02/03/2018, 2:29 PM

## 2018-02-03 NOTE — Telephone Encounter (Signed)
Pt takes Eliquis for afib with CHADS2VASc score of 5 (female, HTN, CAD, CVA). Renal function is normal. Recommend only holding Eliquis for 24 hours prior to procedure due to history of CVA.

## 2018-02-10 ENCOUNTER — Ambulatory Visit (INDEPENDENT_AMBULATORY_CARE_PROVIDER_SITE_OTHER): Payer: Managed Care, Other (non HMO)

## 2018-02-10 DIAGNOSIS — R001 Bradycardia, unspecified: Secondary | ICD-10-CM | POA: Diagnosis not present

## 2018-02-11 LAB — CUP PACEART REMOTE DEVICE CHECK
Date Time Interrogation Session: 20200121065022
Implantable Lead Location: 753860
Lead Channel Impedance Value: 450 Ohm
Lead Channel Pacing Threshold Pulse Width: 0.5 ms
Lead Channel Sensing Intrinsic Amplitude: 5 mV
MDC IDC LEAD IMPLANT DT: 20181018
MDC IDC LEAD IMPLANT DT: 20181018
MDC IDC LEAD LOCATION: 753859
MDC IDC MSMT BATTERY REMAINING LONGEVITY: 120 mo
MDC IDC MSMT BATTERY REMAINING PERCENTAGE: 95.5 %
MDC IDC MSMT BATTERY VOLTAGE: 3.01 V
MDC IDC MSMT LEADCHNL RA PACING THRESHOLD AMPLITUDE: 0.75 V
MDC IDC PG IMPLANT DT: 20181018
MDC IDC PG SERIAL: 8957639
MDC IDC SET LEADCHNL RA PACING AMPLITUDE: 2 V
MDC IDC STAT BRADY RA PERCENT PACED: 62 %
Pulse Gen Model: 2272

## 2018-02-11 NOTE — Progress Notes (Signed)
Remote pacemaker transmission.   
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
## 2018-02-20 ENCOUNTER — Encounter: Payer: Self-pay | Admitting: Nurse Practitioner

## 2018-02-20 ENCOUNTER — Ambulatory Visit: Payer: Managed Care, Other (non HMO) | Admitting: Nurse Practitioner

## 2018-02-20 VITALS — BP 132/88 | HR 60 | Ht 68.0 in | Wt 248.2 lb

## 2018-02-20 DIAGNOSIS — I4819 Other persistent atrial fibrillation: Secondary | ICD-10-CM

## 2018-02-20 DIAGNOSIS — I495 Sick sinus syndrome: Secondary | ICD-10-CM

## 2018-02-20 LAB — CUP PACEART INCLINIC DEVICE CHECK
Implantable Lead Implant Date: 20181018
Implantable Lead Location: 753859
Implantable Pulse Generator Implant Date: 20181018
MDC IDC LEAD IMPLANT DT: 20181018
MDC IDC LEAD LOCATION: 753860
MDC IDC SESS DTM: 20200130122259
Pulse Gen Serial Number: 8957639

## 2018-02-20 LAB — BASIC METABOLIC PANEL
BUN/Creatinine Ratio: 18 (ref 12–28)
BUN: 14 mg/dL (ref 8–27)
CALCIUM: 8.7 mg/dL (ref 8.7–10.3)
CO2: 24 mmol/L (ref 20–29)
Chloride: 99 mmol/L (ref 96–106)
Creatinine, Ser: 0.76 mg/dL (ref 0.57–1.00)
GFR calc Af Amer: 99 mL/min/{1.73_m2} (ref >59)
GFR, EST NON AFRICAN AMERICAN: 86 mL/min/{1.73_m2} (ref >59)
Glucose: 92 mg/dL (ref 65–99)
POTASSIUM: 4.8 mmol/L (ref 3.5–5.2)
Sodium: 138 mmol/L (ref 134–144)

## 2018-02-20 LAB — MAGNESIUM: Magnesium: 2.1 mg/dL (ref 1.6–2.3)

## 2018-02-20 NOTE — Patient Instructions (Addendum)
Medication Instructions:  TAKE LAST DOSE OF ELIQUIS THE EVENING OF 03/17/18  If you need a refill on your cardiac medications before your next appointment, please call your pharmacy.   Lab work:TODAY BMET MAGNESIUM If you have labs (blood work) drawn today and your tests are completely normal, you will receive your results only by: Marland Kitchen MyChart Message (if you have MyChart) OR . A paper copy in the mail If you have any lab test that is abnormal or we need to change your treatment, we will call you to review the results.  Testing/Procedures: NONE  Follow-Up: At [facility], you and your health needs are our priority.  As part of our continuing mission to provide you with exceptional heart care, we have created designated Provider Care Teams.  These Care Teams include your primary Cardiologist (physician) and Advanced Practice Providers (APPs -  Physician Assistants and Nurse Practitioners) who all work together to provide you with the care you need, when you need it. You will need a follow up appointment in 6 MONTHS.  Please call our office 2 months in advance to schedule this appointment.  You may see Thompson Grayer, MD or one of the following Advanced Practice Providers on your designated Care Team:   Chanetta Marshall, NP . Tommye Standard, PA-C  Any Other Special Instructions Will Be Listed Below (If Applicable). Remote monitoring is used to monitor your Pacemaker  from home. This monitoring reduces the number of office visits required to check your device to one time per year. It allows [place] to keep an eye on the functioning of your device to ensure it is working properly. You are scheduled for a device check from home on 05/12/18. You may send your transmission at any time that day. If you have a wireless device, the transmission will be sent automatically. After your physician reviews your transmission, you will receive a postcard with your next transmission date.

## 2018-02-21 ENCOUNTER — Telehealth: Payer: Self-pay

## 2018-02-21 NOTE — Telephone Encounter (Signed)
-----   Message from Renee Berthold, NP sent at 02/20/2018  8:54 PM EST ----- Please notify patient of stable labs. Thanks!

## 2018-02-21 NOTE — Telephone Encounter (Signed)
Notes recorded by Frederik Schmidt, RN on 02/21/2018 at 8:02 AM EST The patient has been notified of the result and verbalized understanding. All questions (if any) were answered. Frederik Schmidt, RN 02/21/2018 8:02 AM

## 2018-03-18 ENCOUNTER — Encounter: Payer: Self-pay | Admitting: *Deleted

## 2018-03-19 ENCOUNTER — Encounter
Admission: RE | Disposition: A | Discharge status: Home / Self Care | Payer: Self-pay | Source: Home / Self Care | Attending: Unknown Physician Specialty

## 2018-03-19 ENCOUNTER — Ambulatory Visit: Payer: Managed Care, Other (non HMO) | Admitting: Anesthesiology

## 2018-03-19 ENCOUNTER — Encounter: Payer: Self-pay | Admitting: *Deleted

## 2018-03-19 ENCOUNTER — Ambulatory Visit
Admission: RE | Admit: 2018-03-19 | Discharge: 2018-03-19 | Disposition: A | Discharge status: Home / Self Care | Payer: Managed Care, Other (non HMO) | Attending: Unknown Physician Specialty | Admitting: Unknown Physician Specialty

## 2018-03-19 DIAGNOSIS — Z7901 Long term (current) use of anticoagulants: Secondary | ICD-10-CM | POA: Insufficient documentation

## 2018-03-19 DIAGNOSIS — I4891 Unspecified atrial fibrillation: Secondary | ICD-10-CM | POA: Insufficient documentation

## 2018-03-19 DIAGNOSIS — F329 Major depressive disorder, single episode, unspecified: Secondary | ICD-10-CM | POA: Diagnosis not present

## 2018-03-19 DIAGNOSIS — J439 Emphysema, unspecified: Secondary | ICD-10-CM | POA: Diagnosis not present

## 2018-03-19 DIAGNOSIS — Z79890 Hormone replacement therapy (postmenopausal): Secondary | ICD-10-CM | POA: Insufficient documentation

## 2018-03-19 DIAGNOSIS — K317 Polyp of stomach and duodenum: Secondary | ICD-10-CM | POA: Insufficient documentation

## 2018-03-19 DIAGNOSIS — D124 Benign neoplasm of descending colon: Secondary | ICD-10-CM | POA: Insufficient documentation

## 2018-03-19 DIAGNOSIS — E785 Hyperlipidemia, unspecified: Secondary | ICD-10-CM | POA: Insufficient documentation

## 2018-03-19 DIAGNOSIS — Z79899 Other long term (current) drug therapy: Secondary | ICD-10-CM | POA: Insufficient documentation

## 2018-03-19 DIAGNOSIS — K219 Gastro-esophageal reflux disease without esophagitis: Secondary | ICD-10-CM | POA: Insufficient documentation

## 2018-03-19 DIAGNOSIS — Z86010 Personal history of colonic polyps: Secondary | ICD-10-CM | POA: Insufficient documentation

## 2018-03-19 DIAGNOSIS — F5104 Psychophysiologic insomnia: Secondary | ICD-10-CM | POA: Diagnosis not present

## 2018-03-19 DIAGNOSIS — I2510 Atherosclerotic heart disease of native coronary artery without angina pectoris: Secondary | ICD-10-CM | POA: Diagnosis not present

## 2018-03-19 DIAGNOSIS — Z87891 Personal history of nicotine dependence: Secondary | ICD-10-CM | POA: Insufficient documentation

## 2018-03-19 DIAGNOSIS — K625 Hemorrhage of anus and rectum: Secondary | ICD-10-CM | POA: Insufficient documentation

## 2018-03-19 DIAGNOSIS — Z8673 Personal history of transient ischemic attack (TIA), and cerebral infarction without residual deficits: Secondary | ICD-10-CM | POA: Diagnosis not present

## 2018-03-19 DIAGNOSIS — Z8544 Personal history of malignant neoplasm of other female genital organs: Secondary | ICD-10-CM | POA: Diagnosis not present

## 2018-03-19 DIAGNOSIS — F419 Anxiety disorder, unspecified: Secondary | ICD-10-CM | POA: Insufficient documentation

## 2018-03-19 DIAGNOSIS — I10 Essential (primary) hypertension: Secondary | ICD-10-CM | POA: Insufficient documentation

## 2018-03-19 DIAGNOSIS — D509 Iron deficiency anemia, unspecified: Secondary | ICD-10-CM | POA: Insufficient documentation

## 2018-03-19 DIAGNOSIS — Q439 Congenital malformation of intestine, unspecified: Secondary | ICD-10-CM | POA: Diagnosis not present

## 2018-03-19 HISTORY — PX: ESOPHAGOGASTRODUODENOSCOPY (EGD) WITH PROPOFOL: SHX5813

## 2018-03-19 HISTORY — PX: COLONOSCOPY WITH PROPOFOL: SHX5780

## 2018-03-19 HISTORY — DX: Occlusion and stenosis of bilateral carotid arteries: I65.23

## 2018-03-19 HISTORY — DX: Anemia, unspecified: D64.9

## 2018-03-19 HISTORY — DX: Psychophysiologic insomnia: F51.04

## 2018-03-19 SURGERY — COLONOSCOPY WITH PROPOFOL
Anesthesia: General

## 2018-03-19 MED ORDER — PROPOFOL 10 MG/ML IV BOLUS
INTRAVENOUS | Status: AC
  Filled 2018-03-19: qty 20

## 2018-03-19 MED ORDER — PHENYLEPHRINE HCL 10 MG/ML IJ SOLN
INTRAMUSCULAR | Status: DC | PRN
  Administered 2018-03-19: 100 ug via INTRAVENOUS

## 2018-03-19 MED ORDER — FENTANYL CITRATE (PF) 100 MCG/2ML IJ SOLN
INTRAMUSCULAR | Status: DC | PRN
  Administered 2018-03-19 (×2): 25 ug via INTRAVENOUS
  Administered 2018-03-19: 50 ug via INTRAVENOUS

## 2018-03-19 MED ORDER — PROPOFOL 10 MG/ML IV BOLUS
INTRAVENOUS | Status: DC | PRN
  Administered 2018-03-19: 100 mg via INTRAVENOUS
  Administered 2018-03-19: 50 mg via INTRAVENOUS

## 2018-03-19 MED ORDER — PROPOFOL 10 MG/ML IV BOLUS
INTRAVENOUS | Status: AC
  Filled 2018-03-19: qty 40

## 2018-03-19 MED ORDER — PROPOFOL 500 MG/50ML IV EMUL
INTRAVENOUS | Status: DC | PRN
  Administered 2018-03-19: 125 ug/kg/min via INTRAVENOUS

## 2018-03-19 MED ORDER — SODIUM CHLORIDE 0.9 % IV SOLN
INTRAVENOUS | Status: DC
  Administered 2018-03-19: 12:00:00 via INTRAVENOUS

## 2018-03-19 MED ORDER — ONDANSETRON HCL 4 MG/2ML IJ SOLN
INTRAMUSCULAR | Status: AC
  Administered 2018-03-19: 4 mg via INTRAVENOUS
  Filled 2018-03-19: qty 2

## 2018-03-19 MED ORDER — LIDOCAINE HCL (CARDIAC) PF 100 MG/5ML IV SOSY
PREFILLED_SYRINGE | INTRAVENOUS | Status: DC | PRN
  Administered 2018-03-19: 50 mg via INTRAVENOUS

## 2018-03-19 MED ORDER — ONDANSETRON HCL 4 MG/2ML IJ SOLN
4.0000 mg | Freq: Once | INTRAMUSCULAR | Status: AC
  Administered 2018-03-19: 4 mg via INTRAVENOUS

## 2018-03-19 MED ORDER — FENTANYL CITRATE (PF) 100 MCG/2ML IJ SOLN
INTRAMUSCULAR | Status: AC
  Filled 2018-03-19: qty 2
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
## 2018-03-19 NOTE — Op Note (Signed)
[facility] Gastroenterology Patient Name: Renee [name] Procedure Date: 03/19/2018 12:08 PM MRN: [id number] Account #: [id number] Date of Birth: August 11, 1955 Admit Type: Outpatient Age: 63 Room: [facility] ENDO ROOM 3 Gender: Female Note Status: Finalized Procedure:            Colonoscopy Providers:            Manya Silvas, MD Referring MD:         Ramonita Lab, MD (Referring MD) Complications:        No immediate complications. Procedure:            Pre-Anesthesia Assessment:                       - After reviewing the risks and benefits, the patient                        was deemed in satisfactory condition to undergo the                        procedure.                       After obtaining informed consent, the colonoscope was                        passed under direct vision. Throughout the procedure,                        the patient's blood pressure, pulse, and oxygen                        saturations were monitored continuously. The                        Colonoscope was introduced through the anus and                        advanced to the the transverse colon. The colonoscopy                        was extremely difficult due to a tortuous colon. The                        patient tolerated the procedure. Findings:      A small polyp was found in the distal descending colon. The polyp was       sessile. The polyp was removed with a hot snare. Resection and retrieval       were complete.      The colon was very difficult to pass due to the tortuosity of the colon.       Multiple attempts to try only got the scope about 2/3 to the end. SHe       has likely adhesions which prevent the scope to pass through the       complete colon. Impression:           - One small polyp in the distal descending colon,                        removed with a hot snare. Resected and retrieved. Recommendation:       - Await pathology  results. Manya Silvas, MD 03/19/2018  1:16:41 PM This report has been signed electronically. Number of Addenda: 0 Note Initiated On: 03/19/2018 12:08 PM Total Procedure Duration: 0 hours 23 minutes 44 seconds       [facility]

## 2018-03-19 NOTE — Anesthesia Postprocedure Evaluation (Signed)
Anesthesia Post Note  Patient: ANIE JUNIEL  Procedure(s) Performed: COLONOSCOPY WITH PROPOFOL (N/A ) ESOPHAGOGASTRODUODENOSCOPY (EGD) WITH PROPOFOL (N/A )  Patient location during evaluation: Endoscopy Anesthesia Type: General Level of consciousness: awake and alert Pain management: pain level controlled Vital Signs Assessment: post-procedure vital signs reviewed and stable Respiratory status: spontaneous breathing, nonlabored ventilation, respiratory function stable and patient connected to nasal cannula oxygen Cardiovascular status: blood pressure returned to baseline and stable Postop Assessment: no apparent nausea or vomiting Anesthetic complications: no     Last Vitals:  Vitals:   03/19/18 1335 03/19/18 1345  BP: 139/77 (!) 145/65  Pulse: 60 60  Resp: 14 18  Temp: (!) 36.3 C (!) 36.3 C  SpO2: 97% 100%    Last Pain:  Vitals:   03/19/18 1345  TempSrc: Tympanic  PainSc: 0-No pain                 Precious Haws Loree Shehata
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
## 2018-03-19 NOTE — Transfer of Care (Signed)
Immediate Anesthesia Transfer of Care Note  Patient: Renee [name]  Procedure(s) Performed: COLONOSCOPY WITH PROPOFOL (N/A ) ESOPHAGOGASTRODUODENOSCOPY (EGD) WITH PROPOFOL (N/A )  Patient Location: PACU  Anesthesia Type:General  Level of Consciousness: awake, alert  and oriented  Airway & Oxygen Therapy: Patient Spontanous Breathing and Patient connected to face mask oxygen  Post-op Assessment: Report given to RN and Post -op Vital signs reviewed and stable  Post vital signs: Reviewed and stable  Last Vitals:  Vitals Value Taken Time  BP    Temp    Pulse    Resp    SpO2      Last Pain:  Vitals:   03/19/18 1149  TempSrc: Tympanic         Complications: No apparent anesthesia complications

## 2018-03-19 NOTE — Op Note (Signed)
[facility] Gastroenterology Patient Name: Renee [name] Procedure Date: 03/19/2018 12:08 PM MRN: [id number] Account #: [id number] Date of Birth: Mar 09, 1955 Admit Type: Outpatient Age: 62 Room: [facility] ENDO ROOM 3 Gender: Female Note Status: Finalized Procedure:            Upper GI endoscopy Indications:          Unexplained iron deficiency anemia Providers:            Manya Silvas, MD Referring MD:         Ramonita Lab, MD (Referring MD) Medicines:            Propofol per Anesthesia Complications:        No immediate complications. Procedure:            Pre-Anesthesia Assessment:                       - After reviewing the risks and benefits, the patient                        was deemed in satisfactory condition to undergo the                        procedure.                       After obtaining informed consent, the endoscope was                        passed under direct vision. Throughout the procedure,                        the patient's blood pressure, pulse, and oxygen                        saturations were monitored continuously. The Endoscope                        was introduced through the mouth, and advanced to the                        jejunum. The upper GI endoscopy was accomplished                        without difficulty. The patient tolerated the procedure                        well. Findings:      The examined esophagus was normal.      A single diminutive sessile polyp with no bleeding and no stigmata of       recent bleeding was found in the gastric body. Biopsies were taken with       a cold forceps for histology.      There was change in stomach with jejunum and large opening there and I       passed into a short portion.      No ulceration anywhere. Impression:           - Normal esophagus.                       - A single gastric polyp. Biopsied. Recommendation:       -  Await pathology results.                       - Perform  a colonoscopy today. Manya Silvas, MD 03/19/2018 12:42:27 PM This report has been signed electronically. Number of Addenda: 0 Note Initiated On: 03/19/2018 12:08 PM      [facility]

## 2018-03-19 NOTE — Anesthesia Post-op Follow-up Note (Signed)
Anesthesia QCDR form completed.        

## 2018-03-20 ENCOUNTER — Encounter: Payer: Self-pay | Admitting: Unknown Physician Specialty

## 2018-03-20 LAB — SURGICAL PATHOLOGY

## 2018-04-06 ENCOUNTER — Inpatient Hospital Stay (HOSPITAL_COMMUNITY)
Admission: EM | Admit: 2018-04-06 | Discharge: 2018-04-08 | DRG: 281 | Disposition: A | Discharge status: Home / Self Care | Payer: Managed Care, Other (non HMO) | Attending: Internal Medicine | Admitting: Internal Medicine

## 2018-04-06 ENCOUNTER — Encounter (HOSPITAL_COMMUNITY): Payer: Self-pay | Admitting: Emergency Medicine

## 2018-04-06 ENCOUNTER — Inpatient Hospital Stay (HOSPITAL_COMMUNITY): Payer: Managed Care, Other (non HMO)

## 2018-04-06 ENCOUNTER — Other Ambulatory Visit: Payer: Self-pay

## 2018-04-06 ENCOUNTER — Emergency Department (HOSPITAL_COMMUNITY): Payer: Managed Care, Other (non HMO)

## 2018-04-06 DIAGNOSIS — I10 Essential (primary) hypertension: Secondary | ICD-10-CM | POA: Diagnosis present

## 2018-04-06 DIAGNOSIS — Z8673 Personal history of transient ischemic attack (TIA), and cerebral infarction without residual deficits: Secondary | ICD-10-CM

## 2018-04-06 DIAGNOSIS — I480 Paroxysmal atrial fibrillation: Secondary | ICD-10-CM | POA: Diagnosis present

## 2018-04-06 DIAGNOSIS — Z7901 Long term (current) use of anticoagulants: Secondary | ICD-10-CM

## 2018-04-06 DIAGNOSIS — Z7982 Long term (current) use of aspirin: Secondary | ICD-10-CM

## 2018-04-06 DIAGNOSIS — I6523 Occlusion and stenosis of bilateral carotid arteries: Secondary | ICD-10-CM | POA: Diagnosis present

## 2018-04-06 DIAGNOSIS — I495 Sick sinus syndrome: Secondary | ICD-10-CM | POA: Diagnosis present

## 2018-04-06 DIAGNOSIS — Z79899 Other long term (current) drug therapy: Secondary | ICD-10-CM

## 2018-04-06 DIAGNOSIS — Z813 Family history of other psychoactive substance abuse and dependence: Secondary | ICD-10-CM

## 2018-04-06 DIAGNOSIS — E785 Hyperlipidemia, unspecified: Secondary | ICD-10-CM | POA: Diagnosis present

## 2018-04-06 DIAGNOSIS — R079 Chest pain, unspecified: Secondary | ICD-10-CM | POA: Diagnosis present

## 2018-04-06 DIAGNOSIS — K219 Gastro-esophageal reflux disease without esophagitis: Secondary | ICD-10-CM | POA: Diagnosis present

## 2018-04-06 DIAGNOSIS — I340 Nonrheumatic mitral (valve) insufficiency: Secondary | ICD-10-CM

## 2018-04-06 DIAGNOSIS — Z90721 Acquired absence of ovaries, unilateral: Secondary | ICD-10-CM

## 2018-04-06 DIAGNOSIS — F329 Major depressive disorder, single episode, unspecified: Secondary | ICD-10-CM | POA: Diagnosis present

## 2018-04-06 DIAGNOSIS — F419 Anxiety disorder, unspecified: Secondary | ICD-10-CM | POA: Diagnosis present

## 2018-04-06 DIAGNOSIS — I2510 Atherosclerotic heart disease of native coronary artery without angina pectoris: Secondary | ICD-10-CM | POA: Diagnosis present

## 2018-04-06 DIAGNOSIS — Z90710 Acquired absence of both cervix and uterus: Secondary | ICD-10-CM | POA: Diagnosis not present

## 2018-04-06 DIAGNOSIS — Z950 Presence of cardiac pacemaker: Secondary | ICD-10-CM

## 2018-04-06 DIAGNOSIS — Z8544 Personal history of malignant neoplasm of other female genital organs: Secondary | ICD-10-CM

## 2018-04-06 DIAGNOSIS — Z803 Family history of malignant neoplasm of breast: Secondary | ICD-10-CM

## 2018-04-06 DIAGNOSIS — J439 Emphysema, unspecified: Secondary | ICD-10-CM | POA: Diagnosis present

## 2018-04-06 DIAGNOSIS — I214 Non-ST elevation (NSTEMI) myocardial infarction: Secondary | ICD-10-CM | POA: Diagnosis present

## 2018-04-06 DIAGNOSIS — Z86010 Personal history of colonic polyps: Secondary | ICD-10-CM | POA: Diagnosis not present

## 2018-04-06 DIAGNOSIS — I249 Acute ischemic heart disease, unspecified: Secondary | ICD-10-CM | POA: Diagnosis not present

## 2018-04-06 DIAGNOSIS — F439 Reaction to severe stress, unspecified: Secondary | ICD-10-CM

## 2018-04-06 DIAGNOSIS — I5181 Takotsubo syndrome: Secondary | ICD-10-CM | POA: Diagnosis present

## 2018-04-06 DIAGNOSIS — Z8249 Family history of ischemic heart disease and other diseases of the circulatory system: Secondary | ICD-10-CM

## 2018-04-06 DIAGNOSIS — R0789 Other chest pain: Secondary | ICD-10-CM

## 2018-04-06 LAB — I-STAT TROPONIN, ED
TROPONIN I, POC: 2.69 ng/mL — AB (ref 0.00–0.08)
Troponin i, poc: 0.68 ng/mL (ref 0.00–0.08)

## 2018-04-06 LAB — HEMOGLOBIN A1C
Hgb A1c MFr Bld: 5.7 % — ABNORMAL HIGH (ref 4.8–5.6)
Mean Plasma Glucose: 116.89 mg/dL

## 2018-04-06 LAB — TROPONIN I
Troponin I: 4.66 ng/mL (ref <0.03)
Troponin I: 4.13 ng/mL (ref <0.03)
Troponin I: 3.17 ng/mL (ref <0.03)

## 2018-04-06 LAB — LIPID PANEL
CHOL/HDL RATIO: 2.4 ratio
Cholesterol: 169 mg/dL (ref 0–200)
HDL: 71 mg/dL (ref >40)
LDL Cholesterol: 84 mg/dL (ref 0–99)
Triglycerides: 69 mg/dL (ref <150)
VLDL: 14 mg/dL (ref 0–40)

## 2018-04-06 LAB — CBC WITH DIFFERENTIAL/PLATELET
Abs Immature Granulocytes: 0.03 10*3/uL (ref 0.00–0.07)
Basophils Absolute: 0 10*3/uL (ref 0.0–0.1)
Basophils Relative: 0 %
Eosinophils Absolute: 0 10*3/uL (ref 0.0–0.5)
Eosinophils Relative: 0 %
HCT: 41.2 % (ref 36.0–46.0)
Hemoglobin: 13.4 g/dL (ref 12.0–15.0)
Immature Granulocytes: 0 %
LYMPHS PCT: 24 %
Lymphs Abs: 2.4 10*3/uL (ref 0.7–4.0)
MCH: 28.4 pg (ref 26.0–34.0)
MCHC: 32.5 g/dL (ref 30.0–36.0)
MCV: 87.3 fL (ref 80.0–100.0)
Monocytes Absolute: 0.9 10*3/uL (ref 0.1–1.0)
Monocytes Relative: 9 %
Neutro Abs: 6.6 10*3/uL (ref 1.7–7.7)
Neutrophils Relative %: 67 %
Platelets: 370 10*3/uL (ref 150–400)
RBC: 4.72 MIL/uL (ref 3.87–5.11)
RDW: 13.4 % (ref 11.5–15.5)
WBC: 10 10*3/uL (ref 4.0–10.5)
nRBC: 0 % (ref 0.0–0.2)

## 2018-04-06 LAB — BASIC METABOLIC PANEL
Anion gap: 14 (ref 5–15)
BUN: 14 mg/dL (ref 6–20)
CO2: 19 mmol/L — AB (ref 22–32)
Calcium: 9.8 mg/dL (ref 8.9–10.3)
Chloride: 104 mmol/L (ref 98–111)
Creatinine, Ser: 0.83 mg/dL (ref 0.44–1.00)
GFR calc Af Amer: >60 mL/min (ref >60)
GFR calc non Af Amer: >60 mL/min (ref >60)
Glucose, Bld: 114 mg/dL — ABNORMAL HIGH (ref 70–99)
POTASSIUM: 4 mmol/L (ref 3.5–5.1)
Sodium: 137 mmol/L (ref 135–145)

## 2018-04-06 LAB — PROTIME-INR
INR: 1.1 (ref 0.8–1.2)
Prothrombin Time: 14.3 seconds (ref 11.4–15.2)

## 2018-04-06 LAB — TSH: TSH: 7.413 u[IU]/mL — ABNORMAL HIGH (ref 0.350–4.500)

## 2018-04-06 LAB — HEPARIN LEVEL (UNFRACTIONATED): Heparin Unfractionated: 2.08 IU/mL — ABNORMAL HIGH (ref 0.30–0.70)

## 2018-04-06 LAB — APTT
aPTT: 31 seconds (ref 24–36)
aPTT: 60 seconds — ABNORMAL HIGH (ref 24–36)

## 2018-04-06 LAB — ECHOCARDIOGRAM COMPLETE
Height: 68 in
Weight: 3908.8 oz

## 2018-04-06 LAB — HIV ANTIBODY (ROUTINE TESTING W REFLEX): HIV Screen 4th Generation wRfx: NONREACTIVE

## 2018-04-06 LAB — BRAIN NATRIURETIC PEPTIDE: B Natriuretic Peptide: 74.3 pg/mL (ref 0.0–100.0)

## 2018-04-06 LAB — MAGNESIUM: Magnesium: 1.9 mg/dL (ref 1.7–2.4)

## 2018-04-06 MED ORDER — SODIUM CHLORIDE 0.9 % WEIGHT BASED INFUSION: 1.0000 mL/kg/h | INTRAVENOUS | Status: DC

## 2018-04-06 MED ORDER — ATORVASTATIN CALCIUM 40 MG PO TABS
40.0000 mg | ORAL_TABLET | Freq: Every day | ORAL | Status: DC
  Administered 2018-04-06 – 2018-04-07 (×2): 40 mg via ORAL
  Filled 2018-04-06 (×2): qty 1

## 2018-04-06 MED ORDER — LOSARTAN POTASSIUM 50 MG PO TABS
50.0000 mg | ORAL_TABLET | Freq: Every day | ORAL | Status: DC
  Filled 2018-04-06: qty 1

## 2018-04-06 MED ORDER — FERROUS SULFATE 325 (65 FE) MG PO TABS
325.0000 mg | ORAL_TABLET | Freq: Every day | ORAL | Status: DC
  Administered 2018-04-06 – 2018-04-08 (×3): 325 mg via ORAL
  Filled 2018-04-06 (×3): qty 1

## 2018-04-06 MED ORDER — ASPIRIN 81 MG PO CHEW
81.0000 mg | CHEWABLE_TABLET | ORAL | Status: AC
  Administered 2018-04-07: 81 mg via ORAL
  Filled 2018-04-06: qty 1

## 2018-04-06 MED ORDER — SODIUM CHLORIDE 0.9% FLUSH: 3.0000 mL | INTRAVENOUS | Status: DC | PRN

## 2018-04-06 MED ORDER — NITROGLYCERIN 0.4 MG SL SUBL
0.4000 mg | SUBLINGUAL_TABLET | SUBLINGUAL | Status: DC | PRN
  Administered 2018-04-06 (×2): 0.4 mg via SUBLINGUAL
  Filled 2018-04-06 (×2): qty 1

## 2018-04-06 MED ORDER — LORAZEPAM 2 MG/ML IJ SOLN
1.0000 mg | Freq: Once | INTRAMUSCULAR | Status: DC
  Filled 2018-04-06: qty 1

## 2018-04-06 MED ORDER — ONDANSETRON HCL 4 MG/2ML IJ SOLN
4.0000 mg | Freq: Once | INTRAMUSCULAR | Status: AC
  Administered 2018-04-06: 4 mg via INTRAVENOUS
  Filled 2018-04-06: qty 2

## 2018-04-06 MED ORDER — ALPRAZOLAM 0.25 MG PO TABS
0.2500 mg | ORAL_TABLET | Freq: Three times a day (TID) | ORAL | Status: DC | PRN
  Administered 2018-04-06 – 2018-04-08 (×7): 0.25 mg via ORAL
  Filled 2018-04-06 (×7): qty 1

## 2018-04-06 MED ORDER — ADULT MULTIVITAMIN W/MINERALS CH
1.0000 | ORAL_TABLET | Freq: Every day | ORAL | Status: DC
  Administered 2018-04-06 – 2018-04-08 (×3): 1 via ORAL
  Filled 2018-04-06 (×3): qty 1

## 2018-04-06 MED ORDER — SODIUM CHLORIDE 0.9% FLUSH: 3.0000 mL | Freq: Two times a day (BID) | INTRAVENOUS | Status: DC

## 2018-04-06 MED ORDER — HEPARIN (PORCINE) 25000 UT/250ML-% IV SOLN
1450.0000 [IU]/h | INTRAVENOUS | Status: DC
  Administered 2018-04-06: 1300 [IU]/h via INTRAVENOUS
  Filled 2018-04-06 (×3): qty 250

## 2018-04-06 MED ORDER — ACETAMINOPHEN 325 MG PO TABS
650.0000 mg | ORAL_TABLET | ORAL | Status: DC | PRN
  Administered 2018-04-07: 650 mg via ORAL
  Filled 2018-04-06: qty 2

## 2018-04-06 MED ORDER — LOSARTAN POTASSIUM 25 MG PO TABS
25.0000 mg | ORAL_TABLET | Freq: Every day | ORAL | Status: DC
  Administered 2018-04-07 – 2018-04-08 (×2): 25 mg via ORAL
  Filled 2018-04-06 (×2): qty 1

## 2018-04-06 MED ORDER — HYDROXYZINE HCL 10 MG PO TABS
10.0000 mg | ORAL_TABLET | Freq: Once | ORAL | Status: AC
  Administered 2018-04-06: 10 mg via ORAL
  Filled 2018-04-06 (×2): qty 1

## 2018-04-06 MED ORDER — FLUOXETINE HCL 20 MG PO CAPS
60.0000 mg | ORAL_CAPSULE | Freq: Every day | ORAL | Status: DC
  Administered 2018-04-06 – 2018-04-08 (×3): 60 mg via ORAL
  Filled 2018-04-06 (×3): qty 3

## 2018-04-06 MED ORDER — SODIUM CHLORIDE 0.9 % IV SOLN: 250.0000 mL | INTRAVENOUS | Status: DC | PRN

## 2018-04-06 MED ORDER — MORPHINE SULFATE (PF) 2 MG/ML IV SOLN
2.0000 mg | Freq: Once | INTRAVENOUS | Status: AC | PRN
  Administered 2018-04-06: 2 mg via INTRAVENOUS
  Filled 2018-04-06: qty 1

## 2018-04-06 MED ORDER — ONDANSETRON HCL 4 MG/2ML IJ SOLN
4.0000 mg | Freq: Four times a day (QID) | INTRAMUSCULAR | Status: DC | PRN
  Administered 2018-04-07: 4 mg via INTRAVENOUS
  Filled 2018-04-06: qty 2

## 2018-04-06 MED ORDER — DOFETILIDE 250 MCG PO CAPS
250.0000 ug | ORAL_CAPSULE | Freq: Two times a day (BID) | ORAL | Status: DC
  Administered 2018-04-06 (×2): 250 ug via ORAL
  Filled 2018-04-06 (×2): qty 1

## 2018-04-06 MED ORDER — METOPROLOL TARTRATE 12.5 MG HALF TABLET
12.5000 mg | ORAL_TABLET | Freq: Two times a day (BID) | ORAL | Status: DC
  Administered 2018-04-06 – 2018-04-08 (×5): 12.5 mg via ORAL
  Filled 2018-04-06 (×5): qty 1

## 2018-04-06 MED ORDER — ASPIRIN 81 MG PO CHEW
324.0000 mg | CHEWABLE_TABLET | Freq: Once | ORAL | Status: AC
  Administered 2018-04-06: 324 mg via ORAL
  Filled 2018-04-06: qty 4

## 2018-04-06 MED ORDER — MORPHINE SULFATE (PF) 2 MG/ML IV SOLN
2.0000 mg | Freq: Once | INTRAVENOUS | Status: AC
  Administered 2018-04-06: 2 mg via INTRAVENOUS
  Filled 2018-04-06: qty 1

## 2018-04-06 MED ORDER — ASPIRIN EC 81 MG PO TBEC
81.0000 mg | DELAYED_RELEASE_TABLET | Freq: Every day | ORAL | Status: DC
  Administered 2018-04-08: 81 mg via ORAL
  Filled 2018-04-06: qty 1

## 2018-04-06 MED ORDER — SODIUM CHLORIDE 0.9 % WEIGHT BASED INFUSION
3.0000 mL/kg/h | INTRAVENOUS | Status: DC
  Administered 2018-04-07: 3 mL/kg/h via INTRAVENOUS
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
## 2018-04-06 NOTE — ED Notes (Signed)
Dr Wyvonnia Dusky informed of tropinon  .[place]
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
## 2018-04-06 NOTE — ED Notes (Signed)
Lab to add on BNP to previously drawn lab work
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
## 2018-04-06 NOTE — Progress Notes (Signed)
*  PRELIMINARY RESULTS* Echocardiogram 2D Echocardiogram has been performed.  Renee [name] 04/06/2018, 4:55 PM
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
## 2018-04-06 NOTE — Progress Notes (Signed)
Lab notified critical troponin 4.66.  NP Glenford Peers notified.

## 2018-04-07 ENCOUNTER — Encounter (HOSPITAL_COMMUNITY)
Admission: EM | Disposition: A | Discharge status: Home / Self Care | Payer: Self-pay | Source: Home / Self Care | Attending: Internal Medicine

## 2018-04-07 DIAGNOSIS — I5181 Takotsubo syndrome: Secondary | ICD-10-CM

## 2018-04-07 HISTORY — PX: LEFT HEART CATH AND CORONARY ANGIOGRAPHY: CATH118249

## 2018-04-07 LAB — BASIC METABOLIC PANEL
Anion gap: 9 (ref 5–15)
BUN: 11 mg/dL (ref 6–20)
CO2: 25 mmol/L (ref 22–32)
Calcium: 9.3 mg/dL (ref 8.9–10.3)
Chloride: 103 mmol/L (ref 98–111)
Creatinine, Ser: 0.75 mg/dL (ref 0.44–1.00)
GFR calc Af Amer: >60 mL/min (ref >60)
GFR calc non Af Amer: >60 mL/min (ref >60)
GLUCOSE: 114 mg/dL — AB (ref 70–99)
Potassium: 4 mmol/L (ref 3.5–5.1)
Sodium: 137 mmol/L (ref 135–145)

## 2018-04-07 LAB — APTT: aPTT: 74 seconds — ABNORMAL HIGH (ref 24–36)

## 2018-04-07 LAB — HEPARIN LEVEL (UNFRACTIONATED): Heparin Unfractionated: 0.89 IU/mL — ABNORMAL HIGH (ref 0.30–0.70)

## 2018-04-07 SURGERY — LEFT HEART CATH AND CORONARY ANGIOGRAPHY
Anesthesia: LOCAL

## 2018-04-07 MED ORDER — MIDAZOLAM HCL 2 MG/2ML IJ SOLN
INTRAMUSCULAR | Status: DC | PRN
  Administered 2018-04-07: 1 mg via INTRAVENOUS

## 2018-04-07 MED ORDER — HEPARIN SODIUM (PORCINE) 1000 UNIT/ML IJ SOLN
INTRAMUSCULAR | Status: DC | PRN
  Administered 2018-04-07: 5000 [IU] via INTRAVENOUS

## 2018-04-07 MED ORDER — FENTANYL CITRATE (PF) 100 MCG/2ML IJ SOLN
INTRAMUSCULAR | Status: AC
  Filled 2018-04-07: qty 2

## 2018-04-07 MED ORDER — SODIUM CHLORIDE 0.9% FLUSH
3.0000 mL | Freq: Two times a day (BID) | INTRAVENOUS | Status: DC
  Administered 2018-04-07 (×2): 3 mL via INTRAVENOUS

## 2018-04-07 MED ORDER — LIDOCAINE HCL (PF) 1 % IJ SOLN
INTRAMUSCULAR | Status: DC | PRN
  Administered 2018-04-07: 2 mL via INTRADERMAL

## 2018-04-07 MED ORDER — HEPARIN (PORCINE) IN NACL 1000-0.9 UT/500ML-% IV SOLN
INTRAVENOUS | Status: DC | PRN
  Administered 2018-04-07 (×2): 500 mL

## 2018-04-07 MED ORDER — HEPARIN (PORCINE) IN NACL 1000-0.9 UT/500ML-% IV SOLN
INTRAVENOUS | Status: AC
  Filled 2018-04-07: qty 1000

## 2018-04-07 MED ORDER — MIDAZOLAM HCL 2 MG/2ML IJ SOLN
INTRAMUSCULAR | Status: AC
  Filled 2018-04-07: qty 2

## 2018-04-07 MED ORDER — APIXABAN 5 MG PO TABS
5.0000 mg | ORAL_TABLET | Freq: Two times a day (BID) | ORAL | Status: DC
  Administered 2018-04-08: 5 mg via ORAL
  Filled 2018-04-07: qty 1

## 2018-04-07 MED ORDER — LIDOCAINE HCL (PF) 1 % IJ SOLN
INTRAMUSCULAR | Status: AC
  Filled 2018-04-07: qty 30

## 2018-04-07 MED ORDER — MAGNESIUM SULFATE IN D5W 1-5 GM/100ML-% IV SOLN
1.0000 g | Freq: Once | INTRAVENOUS | Status: AC
  Administered 2018-04-07: 1 g via INTRAVENOUS
  Filled 2018-04-07: qty 100

## 2018-04-07 MED ORDER — FENTANYL CITRATE (PF) 100 MCG/2ML IJ SOLN
INTRAMUSCULAR | Status: DC | PRN
  Administered 2018-04-07: 50 ug via INTRAVENOUS

## 2018-04-07 MED ORDER — VERAPAMIL HCL 2.5 MG/ML IV SOLN
INTRAVENOUS | Status: AC
  Filled 2018-04-07: qty 2

## 2018-04-07 MED ORDER — VERAPAMIL HCL 2.5 MG/ML IV SOLN
INTRAVENOUS | Status: DC | PRN
  Administered 2018-04-07: 10 mL via INTRA_ARTERIAL

## 2018-04-07 MED ORDER — SODIUM CHLORIDE 0.9% FLUSH: 3.0000 mL | INTRAVENOUS | Status: DC | PRN

## 2018-04-07 MED ORDER — IOHEXOL 350 MG/ML SOLN
INTRAVENOUS | Status: DC | PRN
  Administered 2018-04-07: 65 mL via INTRAVENOUS

## 2018-04-07 MED ORDER — SODIUM CHLORIDE 0.9 % IV SOLN: 250.0000 mL | INTRAVENOUS | Status: DC | PRN

## 2018-04-07 SURGICAL SUPPLY — 10 items
CATH 5FR JL3.5 JR4 ANG PIG MP (CATHETERS) ×1 IMPLANT
DEVICE RAD COMP TR BAND LRG (VASCULAR PRODUCTS) ×1 IMPLANT
GLIDESHEATH SLEND SS 6F .021 (SHEATH) ×2 IMPLANT
GUIDEWIRE INQWIRE 1.5J.035X260 (WIRE) ×1 IMPLANT
INQWIRE 1.5J .035X260CM (WIRE) ×2
KIT HEART LEFT (KITS) ×2 IMPLANT
PACK CARDIAC CATHETERIZATION (CUSTOM PROCEDURE TRAY) ×2 IMPLANT
SYR MEDRAD MARK 7 150ML (SYRINGE) ×2 IMPLANT
TRANSDUCER W/STOPCOCK (MISCELLANEOUS) ×2 IMPLANT
TUBING CIL FLEX 10 FLL-RA (TUBING) ×2 IMPLANT

## 2018-04-07 NOTE — Progress Notes (Signed)
Patient status post cardiac cath.  Denies dizziness or CP.  Radial site level 0.  Sleeping on and off.
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
## 2018-04-07 NOTE — Interval H&P Note (Signed)
History and Physical Interval Note:  04/07/2018 7:43 AM  Renee [name]  has presented today for cardiac catheterization, with the diagnosis of NSTEMI.  The various methods of treatment have been discussed with the patient and family. After consideration of risks, benefits and other options for treatment, the patient has consented to  Procedure(s): LEFT HEART CATH AND CORONARY ANGIOGRAPHY (N/A) as a surgical intervention.  The patient's history has been reviewed, patient examined, no change in status, stable for surgery.  I have reviewed the patient's chart and labs.  Questions were answered to the patient's satisfaction.    Cath Lab Visit (complete for each Cath Lab visit)  Clinical Evaluation Leading to the Procedure:   ACS: Yes.    Non-ACS:  N/A  Shaundra Fullam

## 2018-04-07 NOTE — Brief Op Note (Signed)
BRIEF CARDIAC CATHETERIZATION NOTE  DATE: 04/07/2018 TIME: 8:25 AM  PATIENT:  Renee [name]  63 y.o. female  PRE-OPERATIVE DIAGNOSIS:  NSTEMI  POST-OPERATIVE DIAGNOSIS:  Stress-induced cardiomyopathy  PROCEDURE:  Procedure(s): LEFT HEART CATH AND CORONARY ANGIOGRAPHY (N/A)  SURGEON:  Surgeon(s) and Role:    * Teandre Hamre, Harrell Gave, MD - Primary  FINDINGS: 1. No angiographically significant coronary artery disease. 2. Mildly reduced LVEF (45-50%) with apical akinesis consistent with stress-induced cardiomyopathy.  RECOMMENDATIONS: 1. Medical therapy; continue metoprolol and losartan. 2. No IVF given moderately elevated LVEDP.  May need gentle diuresis if dyspnea develops. 3. Start heparin infusion 2 hours after TR band removal.  If no evidence of bleeding, restart apixaban tomorrow AM. 4. Hold dofetilide today given prolonged QT.  [name], MD [facility] Pager: [phone]

## 2018-04-07 NOTE — Progress Notes (Signed)
Patient to cardiac cath via bed at 0725hrs. Daughter with patient.
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
## 2018-04-08 ENCOUNTER — Encounter (HOSPITAL_COMMUNITY): Payer: Self-pay | Admitting: Internal Medicine

## 2018-04-08 LAB — BASIC METABOLIC PANEL
Anion gap: 6 (ref 5–15)
BUN: 12 mg/dL (ref 6–20)
CO2: 28 mmol/L (ref 22–32)
CREATININE: 0.92 mg/dL (ref 0.44–1.00)
Calcium: 9.2 mg/dL (ref 8.9–10.3)
Chloride: 103 mmol/L (ref 98–111)
GFR calc Af Amer: >60 mL/min (ref >60)
GFR calc non Af Amer: >60 mL/min (ref >60)
Glucose, Bld: 105 mg/dL — ABNORMAL HIGH (ref 70–99)
Potassium: 4.1 mmol/L (ref 3.5–5.1)
Sodium: 137 mmol/L (ref 135–145)

## 2018-04-08 LAB — MAGNESIUM: Magnesium: 2.1 mg/dL (ref 1.7–2.4)

## 2018-04-08 MED ORDER — METOPROLOL SUCCINATE ER 25 MG PO TB24: 25.0000 mg | ORAL_TABLET | Freq: Every day | ORAL | 2 refills | Status: DC

## 2018-04-08 NOTE — Progress Notes (Signed)
Discharge instructions (including medications) discussed with and copy provided to patient/caregiver 
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
## 2018-04-08 NOTE — Discharge Instructions (Signed)
Radial Site Care ° °This sheet gives you information about how to care for yourself after your procedure. Your health care provider may also give you more specific instructions. If you have problems or questions, contact your health care provider. °What can I expect after the procedure? °After the procedure, it is common to have: °· Bruising and tenderness at the catheter insertion area. °Follow these instructions at home: °Medicines °· Take over-the-counter and prescription medicines only as told by your health care provider. °Insertion site care °· Follow instructions from your health care provider about how to take care of your insertion site. Make sure you: °? Wash your hands with soap and water before you change your bandage (dressing). If soap and water are not available, use hand sanitizer. °? Change your dressing as told by your health care provider. °? Leave stitches (sutures), skin glue, or adhesive strips in place. These skin closures may need to stay in place for 2 weeks or longer. If adhesive strip edges start to loosen and curl up, you may trim the loose edges. Do not remove adhesive strips completely unless your health care provider tells you to do that. °· Check your insertion site every day for signs of infection. Check for: °? Redness, swelling, or pain. °? Fluid or blood. °? Pus or a bad smell. °? Warmth. °· Do not take baths, swim, or use a hot tub until your health care provider approves. °· You may shower 24-48 hours after the procedure, or as directed by your health care provider. °? Remove the dressing and gently wash the site with plain soap and water. °? Pat the area dry with a clean towel. °? Do not rub the site. That could cause bleeding. °· Do not apply powder or lotion to the site. °Activity ° °· For 24 hours after the procedure, or as directed by your health care provider: °? Do not flex or bend the affected arm. °? Do not push or pull heavy objects with the affected arm. °? Do not  drive yourself home from the hospital or clinic. You may drive 24 hours after the procedure unless your health care provider tells you not to. °? Do not operate machinery or power tools. °· Do not lift anything that is heavier than 10 lb (4.5 kg), or the limit that you are told, until your health care provider says that it is safe. °· Ask your health care provider when it is okay to: °? Return to work or school. °? Resume usual physical activities or sports. °? Resume sexual activity. °General instructions °· If the catheter site starts to bleed, raise your arm and put firm pressure on the site. If the bleeding does not stop, get help right away. This is a medical emergency. °· If you went home on the same day as your procedure, a responsible adult should be with you for the first 24 hours after you arrive home. °· Keep all follow-up visits as told by your health care provider. This is important. °Contact a health care provider if: °· You have a fever. °· You have redness, swelling, or yellow drainage around your insertion site. °Get help right away if: °· You have unusual pain at the radial site. °· The catheter insertion area swells very fast. °· The insertion area is bleeding, and the bleeding does not stop when you hold steady pressure on the area. °· Your arm or hand becomes pale, cool, tingly, or numb. °These symptoms may represent a serious problem   that is an emergency. Do not wait to see if the symptoms will go away. Get medical help right away. Call your local emergency services (911 in the [place] Do not drive yourself to the hospital. °Summary °· After the procedure, it is common to have bruising and tenderness at the site. °· Follow instructions from your health care provider about how to take care of your radial site wound. Check the wound every day for signs of infection. °· Do not lift anything that is heavier than 10 lb (4.5 kg), or the limit that you are told, until your health care provider says  that it is safe. °This information is not intended to replace advice given to you by your health care provider. Make sure you discuss any questions you have with your health care provider. °Document Released: 02/10/2010 Document Revised: 02/13/2017 Document Reviewed: 02/13/2017 °[employer] Interactive Patient Education © 2019 [employer] ° °

## 2018-04-17 ENCOUNTER — Other Ambulatory Visit: Payer: Self-pay

## 2018-04-17 ENCOUNTER — Ambulatory Visit (HOSPITAL_COMMUNITY)
Admission: RE | Admit: 2018-04-17 | Discharge: 2018-04-17 | Disposition: A | Discharge status: Home / Self Care | Payer: Managed Care, Other (non HMO) | Source: Ambulatory Visit | Attending: Nurse Practitioner | Admitting: Nurse Practitioner

## 2018-04-17 VITALS — BP 142/82 | HR 65 | Ht 68.0 in | Wt 246.0 lb

## 2018-04-17 DIAGNOSIS — I10 Essential (primary) hypertension: Secondary | ICD-10-CM | POA: Insufficient documentation

## 2018-04-17 DIAGNOSIS — Z886 Allergy status to analgesic agent status: Secondary | ICD-10-CM | POA: Diagnosis not present

## 2018-04-17 DIAGNOSIS — Z9049 Acquired absence of other specified parts of digestive tract: Secondary | ICD-10-CM | POA: Insufficient documentation

## 2018-04-17 DIAGNOSIS — Z8249 Family history of ischemic heart disease and other diseases of the circulatory system: Secondary | ICD-10-CM | POA: Insufficient documentation

## 2018-04-17 DIAGNOSIS — D509 Iron deficiency anemia, unspecified: Secondary | ICD-10-CM | POA: Insufficient documentation

## 2018-04-17 DIAGNOSIS — R9431 Abnormal electrocardiogram [ECG] [EKG]: Secondary | ICD-10-CM | POA: Insufficient documentation

## 2018-04-17 DIAGNOSIS — Z87891 Personal history of nicotine dependence: Secondary | ICD-10-CM | POA: Diagnosis not present

## 2018-04-17 DIAGNOSIS — I5181 Takotsubo syndrome: Secondary | ICD-10-CM | POA: Insufficient documentation

## 2018-04-17 DIAGNOSIS — Z8544 Personal history of malignant neoplasm of other female genital organs: Secondary | ICD-10-CM | POA: Insufficient documentation

## 2018-04-17 DIAGNOSIS — F419 Anxiety disorder, unspecified: Secondary | ICD-10-CM | POA: Insufficient documentation

## 2018-04-17 DIAGNOSIS — F5104 Psychophysiologic insomnia: Secondary | ICD-10-CM | POA: Diagnosis not present

## 2018-04-17 DIAGNOSIS — E785 Hyperlipidemia, unspecified: Secondary | ICD-10-CM | POA: Diagnosis not present

## 2018-04-17 DIAGNOSIS — Z888 Allergy status to other drugs, medicaments and biological substances status: Secondary | ICD-10-CM | POA: Diagnosis not present

## 2018-04-17 DIAGNOSIS — F329 Major depressive disorder, single episode, unspecified: Secondary | ICD-10-CM | POA: Insufficient documentation

## 2018-04-17 DIAGNOSIS — M19072 Primary osteoarthritis, left ankle and foot: Secondary | ICD-10-CM | POA: Insufficient documentation

## 2018-04-17 DIAGNOSIS — I4819 Other persistent atrial fibrillation: Secondary | ICD-10-CM | POA: Diagnosis not present

## 2018-04-17 DIAGNOSIS — Z885 Allergy status to narcotic agent status: Secondary | ICD-10-CM | POA: Insufficient documentation

## 2018-04-17 DIAGNOSIS — Z803 Family history of malignant neoplasm of breast: Secondary | ICD-10-CM | POA: Diagnosis not present

## 2018-04-17 DIAGNOSIS — Z79899 Other long term (current) drug therapy: Secondary | ICD-10-CM | POA: Diagnosis not present

## 2018-04-17 DIAGNOSIS — K219 Gastro-esophageal reflux disease without esophagitis: Secondary | ICD-10-CM | POA: Diagnosis not present

## 2018-04-17 DIAGNOSIS — J439 Emphysema, unspecified: Secondary | ICD-10-CM | POA: Insufficient documentation

## 2018-04-17 DIAGNOSIS — M19041 Primary osteoarthritis, right hand: Secondary | ICD-10-CM | POA: Diagnosis not present

## 2018-04-17 DIAGNOSIS — Z7901 Long term (current) use of anticoagulants: Secondary | ICD-10-CM | POA: Diagnosis not present

## 2018-04-17 DIAGNOSIS — R001 Bradycardia, unspecified: Secondary | ICD-10-CM | POA: Diagnosis not present

## 2018-04-17 DIAGNOSIS — I2510 Atherosclerotic heart disease of native coronary artery without angina pectoris: Secondary | ICD-10-CM | POA: Insufficient documentation
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
## 2018-04-18 ENCOUNTER — Ambulatory Visit (HOSPITAL_COMMUNITY): Payer: Managed Care, Other (non HMO) | Admitting: Nurse Practitioner

## 2018-04-21 ENCOUNTER — Telehealth: Payer: Self-pay | Admitting: Internal Medicine

## 2018-04-21 NOTE — Telephone Encounter (Signed)
Lm informing next appt is due in 4 weeks with Dr. Rayann Heman. Appt scheduling is currently pending. If further information needed I have left my contact information.

## 2018-04-21 NOTE — Telephone Encounter (Signed)
New message    Per Narda Rutherford, Case Manager with [facility],  wants to know if she can get some information on when the patient is due for a follow up visit. Please call to discuss.

## 2018-05-12 ENCOUNTER — Ambulatory Visit (INDEPENDENT_AMBULATORY_CARE_PROVIDER_SITE_OTHER): Payer: Managed Care, Other (non HMO) | Admitting: *Deleted

## 2018-05-12 ENCOUNTER — Other Ambulatory Visit: Payer: Self-pay

## 2018-05-12 DIAGNOSIS — I495 Sick sinus syndrome: Secondary | ICD-10-CM

## 2018-05-12 LAB — CUP PACEART REMOTE DEVICE CHECK
Battery Remaining Longevity: 126 mo
Battery Remaining Percentage: 95.5 %
Battery Voltage: 3.01 V
Brady Statistic RA Percent Paced: 76 %
Date Time Interrogation Session: 20200420081321
Implantable Lead Implant Date: 20181018
Implantable Lead Implant Date: 20181018
Implantable Lead Location: 753859
Implantable Lead Location: 753860
Implantable Pulse Generator Implant Date: 20181018
Lead Channel Impedance Value: 430 Ohm
Lead Channel Pacing Threshold Amplitude: 0.5 V
Lead Channel Pacing Threshold Pulse Width: 0.5 ms
Lead Channel Sensing Intrinsic Amplitude: 5 mV
Lead Channel Setting Pacing Amplitude: 2 V
Pulse Gen Model: 2272
Pulse Gen Serial Number: 8957639

## 2018-05-13 ENCOUNTER — Telehealth: Payer: Self-pay

## 2018-05-13 NOTE — Telephone Encounter (Signed)
Spoke with pt regarding appt on 05/14/18. Pt stated she is unable to upload EKG, but will check vitals prior to appt. Pt questions and concerns were address.

## 2018-05-14 ENCOUNTER — Telehealth: Payer: Managed Care, Other (non HMO) | Admitting: Internal Medicine

## 2018-05-14 NOTE — Telephone Encounter (Signed)
New Message  Patient locked out of phone and going to buy new one now will call back when she gets a new phone.

## 2018-05-14 NOTE — Telephone Encounter (Signed)
FOLLOW UP   Called pt to RS appt from today. LMOM for pt to call back.

## 2018-05-15 NOTE — Telephone Encounter (Signed)
Follow up     [facility] for pt to call and rs appt from 04.22.20 with Dr. Rayann Heman.

## 2018-05-20 ENCOUNTER — Encounter: Payer: Self-pay | Admitting: Cardiology

## 2018-05-20 NOTE — Progress Notes (Signed)
Remote pacemaker transmission.   

## 2018-06-10 ENCOUNTER — Telehealth: Payer: Self-pay

## 2018-06-10 NOTE — Telephone Encounter (Signed)
Spoke with pt regarding appt on 06/13/18. Pt was advise to check vitals prior to appt. Pt concerns were address.

## 2018-06-12 ENCOUNTER — Telehealth: Payer: Self-pay | Admitting: Internal Medicine

## 2018-06-12 NOTE — Telephone Encounter (Signed)
I spoke with pt. She has trouble with video on her phone and is asking about an in person appointment tomorrow.  I explained to pt that Dr Rayann Heman would not be in the office tomorrow because he was doing remote clinic.  Pt is not having urgent issues going on. She just cannot do the video visit. She is willing to do a phone visit.  Best number to reach her is [phone].  Pt said she could also wait until Dr Rayann Heman would be back in office.  It was decided pt would plan on keeping visit as phone visit tomorrow and I would send message to Dr. Rayann Heman to make him aware.  I told pt our office would contact her prior to tomorrow's visit if there would be any changes to this plan.

## 2018-06-12 NOTE — Telephone Encounter (Signed)
New Message    Pt  Is calling and wants to come into the office tomorrow for her appt    Please call back

## 2018-06-13 ENCOUNTER — Telehealth (INDEPENDENT_AMBULATORY_CARE_PROVIDER_SITE_OTHER): Payer: Managed Care, Other (non HMO) | Admitting: Internal Medicine

## 2018-06-13 DIAGNOSIS — I4819 Other persistent atrial fibrillation: Secondary | ICD-10-CM

## 2018-06-13 DIAGNOSIS — I495 Sick sinus syndrome: Secondary | ICD-10-CM | POA: Diagnosis not present

## 2018-06-13 DIAGNOSIS — I110 Hypertensive heart disease with heart failure: Secondary | ICD-10-CM

## 2018-06-13 DIAGNOSIS — I5082 Biventricular heart failure: Secondary | ICD-10-CM

## 2018-06-13 DIAGNOSIS — R001 Bradycardia, unspecified: Secondary | ICD-10-CM | POA: Diagnosis not present

## 2018-06-13 DIAGNOSIS — F439 Reaction to severe stress, unspecified: Secondary | ICD-10-CM | POA: Diagnosis not present

## 2018-06-13 MED ORDER — SPIRONOLACTONE 25 MG PO TABS: 25.0000 mg | ORAL_TABLET | Freq: Every day | ORAL | 3 refills | Status: DC
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
## 2018-06-17 ENCOUNTER — Other Ambulatory Visit: Payer: Self-pay

## 2018-06-30 ENCOUNTER — Telehealth: Payer: Self-pay | Admitting: *Deleted

## 2018-06-30 ENCOUNTER — Telehealth: Payer: Self-pay

## 2018-06-30 DIAGNOSIS — I4819 Other persistent atrial fibrillation: Secondary | ICD-10-CM

## 2018-06-30 DIAGNOSIS — I110 Hypertensive heart disease with heart failure: Secondary | ICD-10-CM

## 2018-06-30 NOTE — Telephone Encounter (Signed)
Call placed to Pt.  BMET scheduled post med change.  Pt to come to [facility] office on June 10 at 11:00 am

## 2018-06-30 NOTE — Telephone Encounter (Signed)
-----   Message from Thompson Grayer, MD sent at 06/13/2018  2:50 PM EDT ----- Needs bmet in a week  Follow-up with EP APP in 3 months

## 2018-06-30 NOTE — Telephone Encounter (Signed)
    COVID-19 Pre-Screening Questions:  . In the past 7 to 10 days have you had a cough,  shortness of breath, headache, congestion, fever (100 or greater) body aches, chills, sore throat, or sudden loss of taste or sense of smell? . Have you been around anyone with known Covid 19. . Have you been around anyone who is awaiting Covid 19 test results in the past 7 to 10 days? . Have you been around anyone who has been exposed to Covid 19, or has mentioned symptoms of Covid 19 within the past 7 to 10 days?  If you have any concerns/questions about symptoms patients report during screening (either on the phone or at threshold). Contact the provider seeing the patient or DOD for further guidance.  If neither are available contact a member of the leadership team.           Contacted patient via phone call. No to all Covid 19 questions. Has a mask. KB  

## 2018-07-02 ENCOUNTER — Other Ambulatory Visit: Payer: Managed Care, Other (non HMO) | Admitting: *Deleted

## 2018-07-02 ENCOUNTER — Other Ambulatory Visit: Payer: Self-pay

## 2018-07-02 DIAGNOSIS — I4819 Other persistent atrial fibrillation: Secondary | ICD-10-CM

## 2018-07-02 DIAGNOSIS — I110 Hypertensive heart disease with heart failure: Secondary | ICD-10-CM

## 2018-07-02 LAB — BASIC METABOLIC PANEL
BUN/Creatinine Ratio: 13 (ref 12–28)
BUN: 11 mg/dL (ref 8–27)
CO2: 21 mmol/L (ref 20–29)
Calcium: 9.1 mg/dL (ref 8.7–10.3)
Chloride: 105 mmol/L (ref 96–106)
Creatinine, Ser: 0.83 mg/dL (ref 0.57–1.00)
GFR calc Af Amer: 88 mL/min/{1.73_m2} (ref >59)
GFR calc non Af Amer: 76 mL/min/{1.73_m2} (ref >59)
Glucose: 95 mg/dL (ref 65–99)
Potassium: 4.5 mmol/L (ref 3.5–5.2)
Sodium: 140 mmol/L (ref 134–144)

## 2018-08-11 ENCOUNTER — Ambulatory Visit (INDEPENDENT_AMBULATORY_CARE_PROVIDER_SITE_OTHER): Payer: Managed Care, Other (non HMO) | Admitting: *Deleted

## 2018-08-11 DIAGNOSIS — I495 Sick sinus syndrome: Secondary | ICD-10-CM

## 2018-08-12 LAB — CUP PACEART REMOTE DEVICE CHECK
Date Time Interrogation Session: 20200721092118
Implantable Lead Implant Date: 20181018
Implantable Lead Implant Date: 20181018
Implantable Lead Location: 753859
Implantable Lead Location: 753860
Implantable Pulse Generator Implant Date: 20181018
Pulse Gen Model: 2272
Pulse Gen Serial Number: 8957639

## 2018-08-25 ENCOUNTER — Encounter: Payer: Self-pay | Admitting: Cardiology

## 2018-08-25 NOTE — Progress Notes (Signed)
Remote pacemaker transmission.   

## 2018-10-06 ENCOUNTER — Encounter: Payer: Managed Care, Other (non HMO) | Admitting: Nurse Practitioner

## 2018-10-22 ENCOUNTER — Ambulatory Visit: Payer: Managed Care, Other (non HMO) | Admitting: Student

## 2018-10-22 ENCOUNTER — Other Ambulatory Visit: Payer: Self-pay

## 2018-10-22 VITALS — BP 124/88 | HR 63 | Ht 68.0 in | Wt 253.0 lb

## 2018-10-22 DIAGNOSIS — I110 Hypertensive heart disease with heart failure: Secondary | ICD-10-CM | POA: Diagnosis not present

## 2018-10-22 DIAGNOSIS — I5082 Biventricular heart failure: Secondary | ICD-10-CM

## 2018-10-22 DIAGNOSIS — I4819 Other persistent atrial fibrillation: Secondary | ICD-10-CM

## 2018-10-22 DIAGNOSIS — F439 Reaction to severe stress, unspecified: Secondary | ICD-10-CM

## 2018-10-22 DIAGNOSIS — I495 Sick sinus syndrome: Secondary | ICD-10-CM

## 2018-10-22 LAB — CUP PACEART INCLINIC DEVICE CHECK
Battery Remaining Longevity: 121 mo
Battery Voltage: 3.01 V
Brady Statistic RA Percent Paced: 84 %
Brady Statistic RV Percent Paced: 0 %
Date Time Interrogation Session: 20200930142333
Implantable Lead Implant Date: 20181018
Implantable Lead Implant Date: 20181018
Implantable Lead Location: 753859
Implantable Lead Location: 753860
Implantable Pulse Generator Implant Date: 20181018
Lead Channel Impedance Value: 437.5 Ohm
Lead Channel Impedance Value: 462.5 Ohm
Lead Channel Pacing Threshold Amplitude: 0.625 V
Lead Channel Pacing Threshold Amplitude: 0.75 V
Lead Channel Pacing Threshold Amplitude: 0.75 V
Lead Channel Pacing Threshold Pulse Width: 0.5 ms
Lead Channel Pacing Threshold Pulse Width: 0.5 ms
Lead Channel Pacing Threshold Pulse Width: 0.5 ms
Lead Channel Sensing Intrinsic Amplitude: 12 mV
Lead Channel Sensing Intrinsic Amplitude: 5 mV
Lead Channel Setting Pacing Amplitude: 2 V
Pulse Gen Model: 2272
Pulse Gen Serial Number: 8957639

## 2018-10-22 NOTE — Patient Instructions (Addendum)
Medication Instructions:   Your physician recommends that you continue on your current medications as directed. Please refer to the Current Medication list given to you today.' If you need a refill on your cardiac medications before your next appointment, please call your pharmacy.    Lab work:  BMET AND MAG TODAY    If you have labs (blood work) drawn today and your tests are completely normal, you will receive your results only by: Marland Kitchen MyChart Message (if you have MyChart) OR . A paper copy in the mail If you have any lab test that is abnormal or we need to change your treatment, we will call you to review the results.  Testing/Procedures: NONE ORDERED  TODAY   Follow-Up: At [facility], you and your health needs are our priority.  As part of our continuing mission to provide you with exceptional heart care, we have created designated Provider Care Teams.  These Care Teams include your primary Cardiologist (physician) and Advanced Practice Providers (APPs -  Physician Assistants and Nurse Practitioners) who all work together to provide you with the care you need, when you need it. You will need a follow up appointment in 6 months.  Please call our office 2 months in advance to schedule this appointment.  You may see Thompson Grayer, MD or one of the following Advanced Practice Providers on your designated Care Team:   Chanetta Marshall, NP . Tommye Standard, PA-C . Joesph July PA-C   Any Other Special Instructions Will Be Listed Below (If Applicable).
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
## 2018-10-23 ENCOUNTER — Telehealth: Payer: Self-pay

## 2018-10-23 LAB — BASIC METABOLIC PANEL
BUN/Creatinine Ratio: 16 (ref 12–28)
BUN: 12 mg/dL (ref 8–27)
CO2: 24 mmol/L (ref 20–29)
Calcium: 9.8 mg/dL (ref 8.7–10.3)
Chloride: 99 mmol/L (ref 96–106)
Creatinine, Ser: 0.76 mg/dL (ref 0.57–1.00)
GFR calc Af Amer: 98 mL/min/{1.73_m2} (ref >59)
GFR calc non Af Amer: 85 mL/min/{1.73_m2} (ref >59)
Glucose: 106 mg/dL — ABNORMAL HIGH (ref 65–99)
Potassium: 4.4 mmol/L (ref 3.5–5.2)
Sodium: 138 mmol/L (ref 134–144)

## 2018-10-23 LAB — MAGNESIUM: Magnesium: 2 mg/dL (ref 1.6–2.3)

## 2018-10-23 NOTE — Telephone Encounter (Signed)
-----   Message from Shirley Friar, PA-C sent at 10/23/2018  9:16 AM EDT ----- Potassium and magnesium are WNL, please let her know we would like to continue her current medical regimen as is.   Thank you!  Legrand Como [address]" [place], PA-C  10/23/2018 9:16 AM

## 2018-10-23 NOTE — Telephone Encounter (Signed)
Notes recorded by Frederik Schmidt, RN on 10/23/2018 at 9:26 AM EDT  Lpm with results. 10/1  ------

## 2018-11-10 ENCOUNTER — Ambulatory Visit (INDEPENDENT_AMBULATORY_CARE_PROVIDER_SITE_OTHER): Payer: Managed Care, Other (non HMO) | Admitting: *Deleted

## 2018-11-10 DIAGNOSIS — I495 Sick sinus syndrome: Secondary | ICD-10-CM

## 2018-11-10 DIAGNOSIS — I480 Paroxysmal atrial fibrillation: Secondary | ICD-10-CM

## 2018-11-11 LAB — CUP PACEART REMOTE DEVICE CHECK
Battery Remaining Longevity: 125 mo
Battery Remaining Percentage: 95.5 %
Battery Voltage: 3.01 V
Brady Statistic RA Percent Paced: 95 %
Date Time Interrogation Session: 20201020054027
Implantable Lead Implant Date: 20181018
Implantable Lead Implant Date: 20181018
Implantable Lead Location: 753859
Implantable Lead Location: 753860
Implantable Pulse Generator Implant Date: 20181018
Lead Channel Impedance Value: 450 Ohm
Lead Channel Pacing Threshold Amplitude: 0.75 V
Lead Channel Pacing Threshold Pulse Width: 0.5 ms
Lead Channel Sensing Intrinsic Amplitude: 5 mV
Lead Channel Setting Pacing Amplitude: 2 V
Pulse Gen Model: 2272
Pulse Gen Serial Number: 8957639

## 2018-12-01 NOTE — Progress Notes (Signed)
Remote pacemaker transmission.   

## 2018-12-15 ENCOUNTER — Other Ambulatory Visit: Payer: Self-pay | Admitting: Internal Medicine

## 2018-12-15 MED ORDER — POTASSIUM CHLORIDE ER 10 MEQ PO TBCR: 20.0000 meq | EXTENDED_RELEASE_TABLET | Freq: Two times a day (BID) | ORAL | 2 refills | Status: DC

## 2018-12-24 ENCOUNTER — Other Ambulatory Visit: Payer: Self-pay | Admitting: Internal Medicine

## 2018-12-24 DIAGNOSIS — R310 Gross hematuria: Secondary | ICD-10-CM

## 2018-12-24 DIAGNOSIS — R109 Unspecified abdominal pain: Secondary | ICD-10-CM

## 2019-01-08 ENCOUNTER — Ambulatory Visit: Payer: Managed Care, Other (non HMO)

## 2019-01-15 ENCOUNTER — Ambulatory Visit
Admission: RE | Admit: 2019-01-15 | Discharge: 2019-01-15 | Disposition: A | Discharge status: Home / Self Care | Payer: Managed Care, Other (non HMO) | Source: Ambulatory Visit | Attending: Internal Medicine | Admitting: Internal Medicine

## 2019-01-15 ENCOUNTER — Other Ambulatory Visit: Payer: Self-pay

## 2019-01-15 DIAGNOSIS — R310 Gross hematuria: Secondary | ICD-10-CM | POA: Diagnosis present

## 2019-01-15 DIAGNOSIS — R109 Unspecified abdominal pain: Secondary | ICD-10-CM

## 2019-01-19 ENCOUNTER — Other Ambulatory Visit: Payer: Self-pay | Admitting: Internal Medicine

## 2019-01-19 DIAGNOSIS — Z1231 Encounter for screening mammogram for malignant neoplasm of breast: Secondary | ICD-10-CM

## 2019-02-09 ENCOUNTER — Ambulatory Visit (INDEPENDENT_AMBULATORY_CARE_PROVIDER_SITE_OTHER): Payer: Managed Care, Other (non HMO) | Admitting: *Deleted

## 2019-02-09 DIAGNOSIS — I495 Sick sinus syndrome: Secondary | ICD-10-CM

## 2019-02-09 LAB — CUP PACEART REMOTE DEVICE CHECK
Battery Remaining Longevity: 125 mo
Battery Remaining Percentage: 95.5 %
Battery Voltage: 3.02 V
Brady Statistic RA Percent Paced: 92 %
Date Time Interrogation Session: 20210118020012
Implantable Lead Implant Date: 20181018
Implantable Lead Implant Date: 20181018
Implantable Lead Location: 753859
Implantable Lead Location: 753860
Implantable Pulse Generator Implant Date: 20181018
Lead Channel Impedance Value: 450 Ohm
Lead Channel Pacing Threshold Amplitude: 0.75 V
Lead Channel Pacing Threshold Pulse Width: 0.5 ms
Lead Channel Sensing Intrinsic Amplitude: 5 mV
Lead Channel Setting Pacing Amplitude: 2 V
Pulse Gen Model: 2272
Pulse Gen Serial Number: 8957639

## 2019-02-12 ENCOUNTER — Ambulatory Visit
Admission: RE | Admit: 2019-02-12 | Discharge: 2019-02-12 | Disposition: A | Discharge status: Home / Self Care | Payer: Managed Care, Other (non HMO) | Source: Ambulatory Visit | Attending: Internal Medicine | Admitting: Internal Medicine

## 2019-02-12 DIAGNOSIS — Z1231 Encounter for screening mammogram for malignant neoplasm of breast: Secondary | ICD-10-CM | POA: Diagnosis present

## 2019-04-23 ENCOUNTER — Telehealth: Payer: Self-pay | Admitting: Internal Medicine

## 2019-04-23 NOTE — Telephone Encounter (Signed)
New message    Pt c/o BP issue: STAT if pt c/o blurred vision, one-sided weakness or slurred speech  1. What are your last 5 BP readings? 184/106 earlier, 155/81 HR 87 1 hour ago  2. Are you having any other symptoms (ex. Dizziness, headache, blurred vision, passed out)? Shaky, ankle swelling (started last week but getting worse) and SOB off and on  3. What is your BP issue? Pt stated that her PCP Dr Kerrin Mo at [facility] clinic prescribed a fluid pill recently--(chlorthalidone 25mg  daily) and is not sure if this medication is causing the problems.  She called their office but they cannot see her until next week so she thought she would call Dr Rayann Heman to see what he thought.  Please advise.

## 2019-04-23 NOTE — Telephone Encounter (Signed)
Appt made with AT for tomorrow.

## 2019-04-24 ENCOUNTER — Encounter: Payer: Managed Care, Other (non HMO) | Admitting: Student
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
## 2019-05-11 ENCOUNTER — Ambulatory Visit (INDEPENDENT_AMBULATORY_CARE_PROVIDER_SITE_OTHER): Payer: Managed Care, Other (non HMO) | Admitting: *Deleted

## 2019-05-11 DIAGNOSIS — I495 Sick sinus syndrome: Secondary | ICD-10-CM

## 2019-05-11 LAB — CUP PACEART REMOTE DEVICE CHECK
Battery Remaining Longevity: 123 mo
Battery Remaining Percentage: 95.5 %
Battery Voltage: 3.01 V
Brady Statistic RA Percent Paced: 93 %
Date Time Interrogation Session: 20210419020005
Implantable Lead Implant Date: 20181018
Implantable Lead Implant Date: 20181018
Implantable Lead Location: 753859
Implantable Lead Location: 753860
Implantable Pulse Generator Implant Date: 20181018
Lead Channel Impedance Value: 410 Ohm
Lead Channel Pacing Threshold Amplitude: 0.75 V
Lead Channel Pacing Threshold Pulse Width: 0.5 ms
Lead Channel Sensing Intrinsic Amplitude: 5 mV
Lead Channel Setting Pacing Amplitude: 2 V
Pulse Gen Model: 2272
Pulse Gen Serial Number: 8957639

## 2019-05-12 NOTE — Progress Notes (Signed)
PPM Remote  

## 2019-06-09 ENCOUNTER — Other Ambulatory Visit: Payer: Self-pay | Admitting: Internal Medicine

## 2019-08-10 ENCOUNTER — Ambulatory Visit (INDEPENDENT_AMBULATORY_CARE_PROVIDER_SITE_OTHER): Payer: Managed Care, Other (non HMO) | Admitting: *Deleted

## 2019-08-10 DIAGNOSIS — R001 Bradycardia, unspecified: Secondary | ICD-10-CM

## 2019-08-11 LAB — CUP PACEART REMOTE DEVICE CHECK
Battery Remaining Longevity: 123 mo
Battery Remaining Percentage: 95.5 %
Battery Voltage: 3.01 V
Brady Statistic RA Percent Paced: 93 %
Date Time Interrogation Session: 20210720040019
Implantable Lead Implant Date: 20181018
Implantable Lead Implant Date: 20181018
Implantable Lead Location: 753859
Implantable Lead Location: 753860
Implantable Pulse Generator Implant Date: 20181018
Lead Channel Impedance Value: 410 Ohm
Lead Channel Pacing Threshold Amplitude: 0.75 V
Lead Channel Pacing Threshold Pulse Width: 0.5 ms
Lead Channel Sensing Intrinsic Amplitude: 5 mV
Lead Channel Setting Pacing Amplitude: 2 V
Pulse Gen Model: 2272
Pulse Gen Serial Number: 8957639

## 2019-08-12 NOTE — Progress Notes (Signed)
Remote pacemaker transmission.   

## 2019-10-08 ENCOUNTER — Other Ambulatory Visit: Payer: Self-pay | Admitting: Internal Medicine

## 2019-11-09 ENCOUNTER — Ambulatory Visit (INDEPENDENT_AMBULATORY_CARE_PROVIDER_SITE_OTHER): Payer: Managed Care, Other (non HMO)

## 2019-11-09 DIAGNOSIS — R001 Bradycardia, unspecified: Secondary | ICD-10-CM | POA: Diagnosis not present

## 2019-11-10 ENCOUNTER — Other Ambulatory Visit: Payer: Self-pay | Admitting: Internal Medicine

## 2019-11-10 LAB — CUP PACEART REMOTE DEVICE CHECK
Battery Remaining Longevity: 122 mo
Battery Remaining Percentage: 95.5 %
Battery Voltage: 3.01 V
Brady Statistic RA Percent Paced: 94 %
Date Time Interrogation Session: 20211018043558
Implantable Lead Implant Date: 20181018
Implantable Lead Implant Date: 20181018
Implantable Lead Location: 753859
Implantable Lead Location: 753860
Implantable Pulse Generator Implant Date: 20181018
Lead Channel Impedance Value: 400 Ohm
Lead Channel Pacing Threshold Amplitude: 0.75 V
Lead Channel Pacing Threshold Pulse Width: 0.5 ms
Lead Channel Sensing Intrinsic Amplitude: 5 mV
Lead Channel Setting Pacing Amplitude: 2 V
Pulse Gen Model: 2272
Pulse Gen Serial Number: 8957639

## 2019-11-12 ENCOUNTER — Other Ambulatory Visit: Payer: Self-pay | Admitting: Internal Medicine

## 2019-11-12 MED ORDER — ATORVASTATIN CALCIUM 40 MG PO TABS: 40.0000 mg | ORAL_TABLET | Freq: Every day | ORAL | 0 refills | Status: DC

## 2019-11-12 MED ORDER — APIXABAN 5 MG PO TABS: 5.0000 mg | ORAL_TABLET | Freq: Two times a day (BID) | ORAL | 0 refills | Status: DC

## 2019-11-12 NOTE — Telephone Encounter (Signed)
Pt last saw Barrington Ellison, [place] on 10/22/18, pt is overdue for follow-up, pt has an appt scheduled for 12/03/19 with Dr Rayann Heman, last labs 10/28/19 Creat 0.8, age 64, weight 114.8kg, based on specified criteria pt is on appropriate dosage of Eliquis 5mg  BID.  Will refill rx to get pt to upcoming appt with Dr Rayann Heman.

## 2019-11-12 NOTE — Telephone Encounter (Signed)
*  STAT* If patient is at the pharmacy, call can be transferred to refill team.   1. Which medications need to be refilled? (please list name of each medication and dose if known) Eliquis  And Atorvastatin  2. Which pharmacy/location (including street and city if local pharmacy) is medication to be sent to? [facility] [phone]  3. Do they need a 30 day or 90 day supply? 30 days and refills

## 2019-11-13 NOTE — Progress Notes (Signed)
Remote pacemaker transmission.   

## 2019-12-03 ENCOUNTER — Ambulatory Visit: Payer: Managed Care, Other (non HMO) | Admitting: Internal Medicine

## 2019-12-03 ENCOUNTER — Other Ambulatory Visit: Payer: Self-pay

## 2019-12-03 ENCOUNTER — Encounter: Payer: Self-pay | Admitting: Internal Medicine

## 2019-12-03 VITALS — BP 134/84 | HR 71 | Ht 68.0 in | Wt 257.0 lb

## 2019-12-03 DIAGNOSIS — I5181 Takotsubo syndrome: Secondary | ICD-10-CM

## 2019-12-03 DIAGNOSIS — I5082 Biventricular heart failure: Secondary | ICD-10-CM

## 2019-12-03 DIAGNOSIS — I110 Hypertensive heart disease with heart failure: Secondary | ICD-10-CM | POA: Diagnosis not present

## 2019-12-03 DIAGNOSIS — I495 Sick sinus syndrome: Secondary | ICD-10-CM

## 2019-12-03 DIAGNOSIS — I480 Paroxysmal atrial fibrillation: Secondary | ICD-10-CM

## 2019-12-03 MED ORDER — ATORVASTATIN CALCIUM 40 MG PO TABS: 40.0000 mg | ORAL_TABLET | Freq: Every day | ORAL | 0 refills | Status: DC

## 2019-12-03 MED ORDER — SPIRONOLACTONE 25 MG PO TABS: 25.0000 mg | ORAL_TABLET | Freq: Every day | ORAL | 0 refills | Status: DC

## 2019-12-03 MED ORDER — APIXABAN 5 MG PO TABS: 5.0000 mg | ORAL_TABLET | Freq: Two times a day (BID) | ORAL | 0 refills | Status: DC
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
## 2019-12-03 NOTE — Patient Instructions (Addendum)
Medication Instructions:  Your physician recommends that you continue on your current medications as directed. Please refer to the Current Medication list given to you today.  *If you need a refill on your cardiac medications before your next appointment, please call your pharmacy*  Lab Work: None ordered.  If you have labs (blood work) drawn today and your tests are completely normal, you will receive your results only by: Marland Kitchen MyChart Message (if you have MyChart) OR . A paper copy in the mail If you have any lab test that is abnormal or we need to change your treatment, we will call you to review the results.  Testing/Procedures: None ordered.  Follow-Up: At [facility], you and your health needs are our priority.  As part of our continuing mission to provide you with exceptional heart care, we have created designated Provider Care Teams.  These Care Teams include your primary Cardiologist (physician) and Advanced Practice Providers (APPs -  Physician Assistants and Nurse Practitioners) who all work together to provide you with the care you need, when you need it.  We recommend signing up for the patient portal called "MyChart".  Sign up information is provided on this After Visit Summary.  MyChart is used to connect with patients for Virtual Visits (Telemedicine).  Patients are able to view lab/test results, encounter notes, upcoming appointments, etc.  Non-urgent messages can be sent to your provider as well.   To learn more about what you can do with MyChart, go to [email or web address].    Your next appointment:   Your physician wants you to follow-up in: 1 year with Renee [name]. You will receive a reminder letter in the mail two months in advance. If you don't receive a letter, please call our office to schedule the follow-up appointment.  Remote monitoring is used to monitor your Pacemaker from home. This monitoring reduces the number of office visits required to check your device  to one time per year. It allows [place] to keep an eye on the functioning of your device to ensure it is working properly. You are scheduled for a device check from home on 02/08/20. You may send your transmission at any time that day. If you have a wireless device, the transmission will be sent automatically. After your physician reviews your transmission, you will receive a postcard with your next transmission date.  Other Instructions:

## 2020-01-11 ENCOUNTER — Other Ambulatory Visit: Payer: Self-pay | Admitting: Internal Medicine

## 2020-01-11 MED ORDER — ATORVASTATIN CALCIUM 40 MG PO TABS: 40.0000 mg | ORAL_TABLET | Freq: Every day | ORAL | 3 refills | Status: DC

## 2020-01-11 MED ORDER — APIXABAN 5 MG PO TABS: 5.0000 mg | ORAL_TABLET | Freq: Two times a day (BID) | ORAL | 1 refills | Status: DC

## 2020-01-11 MED ORDER — SPIRONOLACTONE 25 MG PO TABS: 25.0000 mg | ORAL_TABLET | Freq: Every day | ORAL | 3 refills | Status: DC

## 2020-01-11 NOTE — Telephone Encounter (Signed)
Pt's medications were sent to pt's pharmacy as requested. Confirmation received.  

## 2020-01-11 NOTE — Telephone Encounter (Signed)
*  STAT* If patient is at the pharmacy, call can be transferred to refill team.   1. Which medications need to be refilled? (please list name of each medication and dose if known)  apixaban (ELIQUIS) 5 MG TABS tablet spironolactone (ALDACTONE) 25 MG tablet atorvastatin (LIPITOR) 40 MG tablet  2. Which pharmacy/location (including street and city if local pharmacy) is medication to be sent to? [id number] - [place], [place] [address]  3. Do they need a 30 day or 90 day supply? 90 day supply  Patient states during her appointment with Dr. Rayann Heman on 12/03/19 a nurse told her that a year supply of these medications would be called in to her local pharmacy, listed above. I show that the medication was only refilled for a 30-day supply. Are we able to send in an Rx for 90-day supply with several refills? Please advise.

## 2020-01-11 NOTE — Telephone Encounter (Signed)
Pt last saw Dr Rayann Heman 12/03/19, last labs 10/28/19 Creat 0.8 at [facility] per care everywhere, age 64, weight 116.6kg, based on specified criteria pt is on appropriate dosage of Eliquis 5mg  BID.  Will refill rx.

## 2020-02-08 ENCOUNTER — Ambulatory Visit (INDEPENDENT_AMBULATORY_CARE_PROVIDER_SITE_OTHER): Payer: Managed Care, Other (non HMO)

## 2020-02-08 DIAGNOSIS — R001 Bradycardia, unspecified: Secondary | ICD-10-CM | POA: Diagnosis not present

## 2020-02-09 LAB — CUP PACEART REMOTE DEVICE CHECK
Battery Remaining Longevity: 122 mo
Battery Remaining Percentage: 95.5 %
Battery Voltage: 3.01 V
Brady Statistic RA Percent Paced: 92 %
Date Time Interrogation Session: 20220117041754
Implantable Lead Implant Date: 20181018
Implantable Lead Implant Date: 20181018
Implantable Lead Location: 753859
Implantable Lead Location: 753860
Implantable Pulse Generator Implant Date: 20181018
Lead Channel Impedance Value: 400 Ohm
Lead Channel Pacing Threshold Amplitude: 0.75 V
Lead Channel Pacing Threshold Pulse Width: 0.5 ms
Lead Channel Sensing Intrinsic Amplitude: 5 mV
Lead Channel Setting Pacing Amplitude: 2 V
Pulse Gen Model: 2272
Pulse Gen Serial Number: 8957639

## 2020-02-22 NOTE — Progress Notes (Signed)
Remote pacemaker transmission.   

## 2020-03-13 ENCOUNTER — Emergency Department (HOSPITAL_COMMUNITY): Payer: Managed Care, Other (non HMO)

## 2020-03-13 ENCOUNTER — Encounter (HOSPITAL_COMMUNITY): Payer: Self-pay | Admitting: Emergency Medicine

## 2020-03-13 ENCOUNTER — Emergency Department (HOSPITAL_COMMUNITY)
Admission: EM | Admit: 2020-03-13 | Discharge: 2020-03-14 | Disposition: A | Discharge status: Home / Self Care | Payer: Managed Care, Other (non HMO) | Attending: Emergency Medicine | Admitting: Emergency Medicine

## 2020-03-13 ENCOUNTER — Other Ambulatory Visit: Payer: Self-pay

## 2020-03-13 DIAGNOSIS — R0789 Other chest pain: Secondary | ICD-10-CM | POA: Diagnosis not present

## 2020-03-13 DIAGNOSIS — N289 Disorder of kidney and ureter, unspecified: Secondary | ICD-10-CM

## 2020-03-13 DIAGNOSIS — I10 Essential (primary) hypertension: Secondary | ICD-10-CM | POA: Diagnosis not present

## 2020-03-13 DIAGNOSIS — Z7901 Long term (current) use of anticoagulants: Secondary | ICD-10-CM | POA: Diagnosis not present

## 2020-03-13 DIAGNOSIS — Z79899 Other long term (current) drug therapy: Secondary | ICD-10-CM | POA: Diagnosis not present

## 2020-03-13 DIAGNOSIS — I2510 Atherosclerotic heart disease of native coronary artery without angina pectoris: Secondary | ICD-10-CM | POA: Insufficient documentation

## 2020-03-13 DIAGNOSIS — R0602 Shortness of breath: Secondary | ICD-10-CM | POA: Insufficient documentation

## 2020-03-13 DIAGNOSIS — Z87891 Personal history of nicotine dependence: Secondary | ICD-10-CM | POA: Diagnosis not present

## 2020-03-13 DIAGNOSIS — Z8673 Personal history of transient ischemic attack (TIA), and cerebral infarction without residual deficits: Secondary | ICD-10-CM | POA: Diagnosis not present

## 2020-03-13 DIAGNOSIS — R5383 Other fatigue: Secondary | ICD-10-CM | POA: Insufficient documentation

## 2020-03-13 DIAGNOSIS — R079 Chest pain, unspecified: Secondary | ICD-10-CM

## 2020-03-13 DIAGNOSIS — Z950 Presence of cardiac pacemaker: Secondary | ICD-10-CM | POA: Insufficient documentation

## 2020-03-13 LAB — BASIC METABOLIC PANEL
Anion gap: 12 (ref 5–15)
BUN: 21 mg/dL (ref 8–23)
CO2: 23 mmol/L (ref 22–32)
Calcium: 9 mg/dL (ref 8.9–10.3)
Chloride: 101 mmol/L (ref 98–111)
Creatinine, Ser: 1.38 mg/dL — ABNORMAL HIGH (ref 0.44–1.00)
GFR, Estimated: 43 mL/min — ABNORMAL LOW (ref >60)
Glucose, Bld: 97 mg/dL (ref 70–99)
Potassium: 3.7 mmol/L (ref 3.5–5.1)
Sodium: 136 mmol/L (ref 135–145)

## 2020-03-13 LAB — PROTIME-INR
INR: 1 (ref 0.8–1.2)
Prothrombin Time: 13.1 seconds (ref 11.4–15.2)

## 2020-03-13 LAB — CBC
HCT: 39.8 % (ref 36.0–46.0)
Hemoglobin: 13.6 g/dL (ref 12.0–15.0)
MCH: 29.5 pg (ref 26.0–34.0)
MCHC: 34.2 g/dL (ref 30.0–36.0)
MCV: 86.3 fL (ref 80.0–100.0)
Platelets: 326 10*3/uL (ref 150–400)
RBC: 4.61 MIL/uL (ref 3.87–5.11)
RDW: 13.5 % (ref 11.5–15.5)
WBC: 11.2 10*3/uL — ABNORMAL HIGH (ref 4.0–10.5)
nRBC: 0 % (ref 0.0–0.2)

## 2020-03-13 LAB — TROPONIN I (HIGH SENSITIVITY)
Troponin I (High Sensitivity): 13 ng/L (ref <18)
Troponin I (High Sensitivity): 11 ng/L (ref <18)

## 2020-03-13 MED ORDER — ASPIRIN 81 MG PO CHEW
162.0000 mg | CHEWABLE_TABLET | Freq: Once | ORAL | Status: AC
  Administered 2020-03-13: 162 mg via ORAL
  Filled 2020-03-13: qty 2

## 2020-03-13 NOTE — ED Triage Notes (Signed)
Pt reports left sided chest pain that has moves to her jaw and left arm and "sweats" since yesterday.  Extensive hx and family cardiac hx.  Reports generalized weakness along w/ left sided extremity weakness "that happens sometime since my stroke years ago."  Pt is on blood thinners.
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
## 2020-03-13 NOTE — ED Provider Notes (Signed)
Care assumed from Dr. Almyra Free, patient with chest pain and negative troponin pending second troponin.  Cardiac catheterization in 2020 showing no significant coronary artery disease.  Repeat troponin is normal and has actually decreased from first troponin.  Patient has felt to be safe for discharge.  Recommended follow-up with her cardiologist, but low likelihood of obstructive coronary disease given normal catheterization 2 years ago.   Delora Fuel, MD 64/35/39 [phone]

## 2020-03-14 NOTE — Discharge Instructions (Signed)
Return if you are having any problems. 

## 2020-04-14 ENCOUNTER — Other Ambulatory Visit: Payer: Self-pay | Admitting: Internal Medicine

## 2020-04-14 DIAGNOSIS — Z1231 Encounter for screening mammogram for malignant neoplasm of breast: Secondary | ICD-10-CM

## 2020-04-29 ENCOUNTER — Other Ambulatory Visit: Payer: Self-pay

## 2020-04-29 ENCOUNTER — Ambulatory Visit
Admission: RE | Admit: 2020-04-29 | Discharge: 2020-04-29 | Disposition: A | Discharge status: Home / Self Care | Payer: Managed Care, Other (non HMO) | Source: Ambulatory Visit | Attending: Internal Medicine | Admitting: Internal Medicine

## 2020-04-29 DIAGNOSIS — Z1231 Encounter for screening mammogram for malignant neoplasm of breast: Secondary | ICD-10-CM | POA: Insufficient documentation

## 2020-05-05 ENCOUNTER — Other Ambulatory Visit: Payer: Self-pay | Admitting: Internal Medicine

## 2020-05-05 DIAGNOSIS — R928 Other abnormal and inconclusive findings on diagnostic imaging of breast: Secondary | ICD-10-CM

## 2020-05-05 DIAGNOSIS — N6320 Unspecified lump in the left breast, unspecified quadrant: Secondary | ICD-10-CM

## 2020-05-09 ENCOUNTER — Ambulatory Visit (INDEPENDENT_AMBULATORY_CARE_PROVIDER_SITE_OTHER): Payer: Managed Care, Other (non HMO)

## 2020-05-09 DIAGNOSIS — I495 Sick sinus syndrome: Secondary | ICD-10-CM

## 2020-05-11 LAB — CUP PACEART REMOTE DEVICE CHECK
Battery Remaining Longevity: 122 mo
Battery Remaining Percentage: 95.5 %
Battery Voltage: 3.01 V
Brady Statistic RA Percent Paced: 93 %
Date Time Interrogation Session: 20220418040152
Implantable Lead Implant Date: 20181018
Implantable Lead Implant Date: 20181018
Implantable Lead Location: 753859
Implantable Lead Location: 753860
Implantable Pulse Generator Implant Date: 20181018
Lead Channel Impedance Value: 400 Ohm
Lead Channel Pacing Threshold Amplitude: 0.75 V
Lead Channel Pacing Threshold Pulse Width: 0.5 ms
Lead Channel Sensing Intrinsic Amplitude: 5 mV
Lead Channel Setting Pacing Amplitude: 2 V
Pulse Gen Model: 2272
Pulse Gen Serial Number: 8957639

## 2020-05-12 ENCOUNTER — Ambulatory Visit
Admission: RE | Admit: 2020-05-12 | Discharge: 2020-05-12 | Disposition: A | Discharge status: Home / Self Care | Payer: Managed Care, Other (non HMO) | Source: Ambulatory Visit | Attending: Internal Medicine | Admitting: Internal Medicine

## 2020-05-12 ENCOUNTER — Other Ambulatory Visit: Payer: Self-pay

## 2020-05-12 DIAGNOSIS — N6320 Unspecified lump in the left breast, unspecified quadrant: Secondary | ICD-10-CM | POA: Diagnosis present

## 2020-05-12 DIAGNOSIS — R928 Other abnormal and inconclusive findings on diagnostic imaging of breast: Secondary | ICD-10-CM | POA: Insufficient documentation

## 2020-05-16 ENCOUNTER — Other Ambulatory Visit: Payer: Self-pay | Admitting: Internal Medicine

## 2020-05-16 DIAGNOSIS — N6320 Unspecified lump in the left breast, unspecified quadrant: Secondary | ICD-10-CM

## 2020-05-25 NOTE — Progress Notes (Signed)
Remote pacemaker transmission.   

## 2020-08-01 ENCOUNTER — Telehealth: Payer: Self-pay | Admitting: Internal Medicine

## 2020-08-01 ENCOUNTER — Other Ambulatory Visit: Payer: Self-pay | Admitting: Internal Medicine

## 2020-08-01 NOTE — Telephone Encounter (Signed)
Prescription refill request for Eliquis received. Indication: afib  Last office visit: allred, 12/03/2019 Scr: 0.8, 07/01/2020 Age: 65  Weight: 116.6kg   Pt is on the correct dose of Eliquis per dosing criteria, prescription refill sent for Eliquis 5mg  BID earlier today to the correct pharmacy. See refill encounter from 7/11 for further documentation.

## 2020-08-01 NOTE — Telephone Encounter (Signed)
Eliquis 5mg  refill request received. Patient is 65 years old, weight-116.6kg, Crea-1.38 on 03/13/2020, Diagnosis-Afib, and last seen by Dr. Rayann Heman on 12/03/2019. Dose is appropriate based on dosing criteria. Will send in refill to requested pharmacy.

## 2020-08-01 NOTE — Telephone Encounter (Signed)
*  STAT* If patient is at the pharmacy, call can be transferred to refill team.   1. Which medications need to be refilled? (please list name of each medication and dose if known) apixaban (ELIQUIS) 5 MG TABS tablet  2. Which pharmacy/location (including street and city if local pharmacy) is medication to be sent to? [id number] - [place], [place] [address]  3. Do they need a 30 day or 90 day supply? 90 ds

## 2020-08-08 ENCOUNTER — Ambulatory Visit (INDEPENDENT_AMBULATORY_CARE_PROVIDER_SITE_OTHER): Payer: Managed Care, Other (non HMO)

## 2020-08-08 DIAGNOSIS — I495 Sick sinus syndrome: Secondary | ICD-10-CM | POA: Diagnosis not present

## 2020-08-09 LAB — CUP PACEART REMOTE DEVICE CHECK
Battery Remaining Longevity: 88 mo
Battery Remaining Percentage: 73 %
Battery Voltage: 3.01 V
Brady Statistic RA Percent Paced: 90 %
Date Time Interrogation Session: 20220718064551
Implantable Lead Implant Date: 20181018
Implantable Lead Implant Date: 20181018
Implantable Lead Location: 753859
Implantable Lead Location: 753860
Implantable Pulse Generator Implant Date: 20181018
Lead Channel Impedance Value: 400 Ohm
Lead Channel Pacing Threshold Amplitude: 0.75 V
Lead Channel Pacing Threshold Pulse Width: 0.5 ms
Lead Channel Sensing Intrinsic Amplitude: 5 mV
Lead Channel Setting Pacing Amplitude: 2 V
Pulse Gen Model: 2272
Pulse Gen Serial Number: 8957639

## 2020-08-31 NOTE — Progress Notes (Signed)
Remote pacemaker transmission.   

## 2020-09-15 ENCOUNTER — Emergency Department (HOSPITAL_COMMUNITY): Payer: Managed Care, Other (non HMO)

## 2020-09-15 ENCOUNTER — Emergency Department (HOSPITAL_COMMUNITY)
Admission: EM | Admit: 2020-09-15 | Discharge: 2020-09-15 | Disposition: A | Discharge status: Home / Self Care | Payer: Managed Care, Other (non HMO) | Attending: Emergency Medicine | Admitting: Emergency Medicine

## 2020-09-15 DIAGNOSIS — Z8673 Personal history of transient ischemic attack (TIA), and cerebral infarction without residual deficits: Secondary | ICD-10-CM | POA: Insufficient documentation

## 2020-09-15 DIAGNOSIS — R079 Chest pain, unspecified: Secondary | ICD-10-CM | POA: Diagnosis present

## 2020-09-15 DIAGNOSIS — Z79899 Other long term (current) drug therapy: Secondary | ICD-10-CM | POA: Diagnosis not present

## 2020-09-15 DIAGNOSIS — I4891 Unspecified atrial fibrillation: Secondary | ICD-10-CM | POA: Diagnosis not present

## 2020-09-15 DIAGNOSIS — Z950 Presence of cardiac pacemaker: Secondary | ICD-10-CM | POA: Insufficient documentation

## 2020-09-15 DIAGNOSIS — I10 Essential (primary) hypertension: Secondary | ICD-10-CM | POA: Insufficient documentation

## 2020-09-15 DIAGNOSIS — R072 Precordial pain: Secondary | ICD-10-CM

## 2020-09-15 DIAGNOSIS — Z7901 Long term (current) use of anticoagulants: Secondary | ICD-10-CM | POA: Diagnosis not present

## 2020-09-15 DIAGNOSIS — Z87891 Personal history of nicotine dependence: Secondary | ICD-10-CM | POA: Insufficient documentation

## 2020-09-15 DIAGNOSIS — I2510 Atherosclerotic heart disease of native coronary artery without angina pectoris: Secondary | ICD-10-CM | POA: Insufficient documentation

## 2020-09-15 LAB — BASIC METABOLIC PANEL
Anion gap: 8 (ref 5–15)
BUN: 9 mg/dL (ref 8–23)
CO2: 26 mmol/L (ref 22–32)
Calcium: 9.3 mg/dL (ref 8.9–10.3)
Chloride: 103 mmol/L (ref 98–111)
Creatinine, Ser: 0.76 mg/dL (ref 0.44–1.00)
GFR, Estimated: >60 mL/min (ref >60)
Glucose, Bld: 137 mg/dL — ABNORMAL HIGH (ref 70–99)
Potassium: 3.9 mmol/L (ref 3.5–5.1)
Sodium: 137 mmol/L (ref 135–145)

## 2020-09-15 LAB — CBC
HCT: 38.8 % (ref 36.0–46.0)
Hemoglobin: 12.6 g/dL (ref 12.0–15.0)
MCH: 28.6 pg (ref 26.0–34.0)
MCHC: 32.5 g/dL (ref 30.0–36.0)
MCV: 88 fL (ref 80.0–100.0)
Platelets: 249 10*3/uL (ref 150–400)
RBC: 4.41 MIL/uL (ref 3.87–5.11)
RDW: 13.6 % (ref 11.5–15.5)
WBC: 6.6 10*3/uL (ref 4.0–10.5)
nRBC: 0 % (ref 0.0–0.2)

## 2020-09-15 LAB — TROPONIN I (HIGH SENSITIVITY)
Troponin I (High Sensitivity): 7 ng/L (ref <18)
Troponin I (High Sensitivity): 6 ng/L (ref <18)

## 2020-09-15 MED ORDER — FAMOTIDINE 20 MG PO TABS
20.0000 mg | ORAL_TABLET | Freq: Once | ORAL | Status: AC
  Administered 2020-09-15: 20 mg via ORAL
  Filled 2020-09-15: qty 1

## 2020-09-15 MED ORDER — ACETAMINOPHEN 500 MG PO TABS
1000.0000 mg | ORAL_TABLET | Freq: Once | ORAL | Status: AC
  Administered 2020-09-15: 1000 mg via ORAL
  Filled 2020-09-15: qty 2

## 2020-09-15 MED ORDER — ALUM & MAG HYDROXIDE-SIMETH 200-200-20 MG/5ML PO SUSP
30.0000 mL | Freq: Once | ORAL | Status: AC
  Administered 2020-09-15: 30 mL via ORAL
  Filled 2020-09-15: qty 30

## 2020-09-15 NOTE — ED Triage Notes (Signed)
Pt c/o episode of dizziness & then sudden CP that radiated to R shoulder, jaw & ear yesterday afternoon. Sat down, took 2 ASA, states she dozed off afterwards. Last night, trouble sleeping, continued chest discomfort. This AM, L arm "bothersome," chest/epigastric area "felt weird."  Hx stroke, afib St Jude pacemaker in place

## 2020-09-15 NOTE — ED Provider Notes (Signed)
Emergency Medicine Provider Triage Evaluation Note  XINYI SHADDOCK , a 65 y.o. female  was evaluated in triage.  Pt complains of CP.  Review of Systems  Positive: Cp, dizzy, jaw and shoulder pain, sob, fatigue Negative: Fever, productive cough, abd pain  Physical Exam  BP (!) 138/93 (BP Location: Left Arm)   Pulse (!) 59   Temp 98.1 F (36.7 C)   Resp 18   LMP  (LMP Unknown)   SpO2 97%  Gen:   Awake, no distress   Resp:  Normal effort  MSK:   Moves extremities without difficulty  Other:    Medical Decision Making  Medically screening exam initiated at 6:55 PM.  Appropriate orders placed.  JANNEL REMUND was informed that the remainder of the evaluation will be completed by another provider, this initial triage assessment does not replace that evaluation, and the importance of remaining in the ED until their evaluation is complete.  Pt with multiple comorbidities here with recurrent CP radiates to neck and jaw since yesterday along with fatigue.  Pain improves with ASA.  Mild pain at this time. 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    Domenic Moras, PA-C 09/15/20 1900    Arnaldo Natal, MD 09/15/20 [phone]
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
## 2020-09-15 NOTE — ED Notes (Signed)
Patient ambulated to bathroom.

## 2020-09-15 NOTE — Discharge Instructions (Addendum)
It was our pleasure to provide your ER care today - we hope that you feel better.  Overall your ER workup and lab tests look good/normal.   Follow up with cardiologist in the next 1-2 weeks - call office tomorrow to arrange appointment.   Your blood pressure is mildly high today - continue meds, and follow up with primary care doctor in the next couple weeks.   Return to ER if worse, new symptoms, recurrent or persistent chest pain, trouble breathing, or other concern.

## 2020-11-07 ENCOUNTER — Ambulatory Visit (INDEPENDENT_AMBULATORY_CARE_PROVIDER_SITE_OTHER): Payer: Managed Care, Other (non HMO)

## 2020-11-07 ENCOUNTER — Telehealth: Payer: Self-pay | Admitting: Internal Medicine

## 2020-11-07 DIAGNOSIS — I495 Sick sinus syndrome: Secondary | ICD-10-CM | POA: Diagnosis not present

## 2020-11-07 NOTE — Telephone Encounter (Signed)
*  STAT* If patient is at the pharmacy, call can be transferred to refill team.   1. Which medications need to be refilled? (please list name of each medication and dose if known) spironolactone (ALDACTONE) 25 MG tablet  2. Which pharmacy/location (including street and city if local pharmacy) is medication to be sent to?  [place], [place]  3. Do they need a 30 day or 90 day supply? [place]

## 2020-11-08 LAB — CUP PACEART REMOTE DEVICE CHECK
Battery Remaining Longevity: 85 mo
Battery Remaining Percentage: 71 %
Battery Voltage: 3.01 V
Brady Statistic RA Percent Paced: 90 %
Date Time Interrogation Session: 20221017032941
Implantable Lead Implant Date: 20181018
Implantable Lead Implant Date: 20181018
Implantable Lead Location: 753859
Implantable Lead Location: 753860
Implantable Pulse Generator Implant Date: 20181018
Lead Channel Impedance Value: 410 Ohm
Lead Channel Pacing Threshold Amplitude: 0.75 V
Lead Channel Pacing Threshold Pulse Width: 0.5 ms
Lead Channel Sensing Intrinsic Amplitude: 5 mV
Lead Channel Setting Pacing Amplitude: 2 V
Pulse Gen Model: 2272
Pulse Gen Serial Number: 8957639

## 2020-11-08 MED ORDER — SPIRONOLACTONE 25 MG PO TABS
25.0000 mg | ORAL_TABLET | Freq: Every day | ORAL | 0 refills | Status: DC
Start: 2020-11-08 — End: 2021-02-08

## 2020-11-08 NOTE — Telephone Encounter (Signed)
30 day refill for Spironolactone has been sent to [facility], per pt request.

## 2020-11-14 ENCOUNTER — Ambulatory Visit
Admission: RE | Admit: 2020-11-14 | Discharge: 2020-11-14 | Disposition: A | Discharge status: Home / Self Care | Payer: Managed Care, Other (non HMO) | Source: Ambulatory Visit | Attending: Internal Medicine | Admitting: Internal Medicine

## 2020-11-14 ENCOUNTER — Other Ambulatory Visit: Payer: Self-pay

## 2020-11-14 DIAGNOSIS — N6320 Unspecified lump in the left breast, unspecified quadrant: Secondary | ICD-10-CM | POA: Insufficient documentation

## 2020-11-15 NOTE — Progress Notes (Signed)
Remote pacemaker transmission.   

## 2020-12-05 ENCOUNTER — Ambulatory Visit: Payer: Managed Care, Other (non HMO) | Admitting: Physician Assistant
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
## 2020-12-21 ENCOUNTER — Ambulatory Visit: Payer: Managed Care, Other (non HMO) | Admitting: Physician Assistant
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
## 2021-01-06 ENCOUNTER — Encounter: Payer: Self-pay | Admitting: Physician Assistant

## 2021-01-06 ENCOUNTER — Other Ambulatory Visit: Payer: Self-pay

## 2021-01-06 ENCOUNTER — Ambulatory Visit: Payer: Managed Care, Other (non HMO) | Admitting: Physician Assistant

## 2021-01-06 ENCOUNTER — Ambulatory Visit (INDEPENDENT_AMBULATORY_CARE_PROVIDER_SITE_OTHER): Payer: Managed Care, Other (non HMO)

## 2021-01-06 VITALS — BP 121/69 | HR 62 | Ht 70.0 in | Wt 253.0 lb

## 2021-01-06 DIAGNOSIS — I429 Cardiomyopathy, unspecified: Secondary | ICD-10-CM

## 2021-01-06 DIAGNOSIS — R079 Chest pain, unspecified: Secondary | ICD-10-CM

## 2021-01-06 DIAGNOSIS — R55 Syncope and collapse: Secondary | ICD-10-CM | POA: Diagnosis not present

## 2021-01-06 DIAGNOSIS — I480 Paroxysmal atrial fibrillation: Secondary | ICD-10-CM

## 2021-01-06 DIAGNOSIS — I495 Sick sinus syndrome: Secondary | ICD-10-CM | POA: Diagnosis not present

## 2021-01-06 DIAGNOSIS — Z950 Presence of cardiac pacemaker: Secondary | ICD-10-CM

## 2021-01-06 MED ORDER — METOPROLOL TARTRATE 100 MG PO TABS: 100.0000 mg | ORAL_TABLET | Freq: Once | ORAL | 0 refills | Status: DC

## 2021-01-06 NOTE — Progress Notes (Unsigned)
Applied a 14 day Zio AT to patient in the office   Dr Rayann Heman to read

## 2021-01-06 NOTE — Patient Instructions (Addendum)
Medication Instructions:   Your physician recommends that you continue on your current medications as directed. Please refer to the Current Medication list given to you today.    *If you need a refill on your cardiac medications before your next appointment, please call your pharmacy*   Lab Work:  BMET AND CBC TODAY    If you have labs (blood work) drawn today and your tests are completely normal, you will receive your results only by: [place] (if you have MyChart) OR A paper copy in the mail If you have any lab test that is abnormal or we need to change your treatment, we will call you to review the results.   Testing/Procedures:   CHEST AND CORONARY Non-Cardiac CT Angiography (CTA), is a special type of CT scan that uses a computer to produce multi-dimensional views of major blood vessels throughout the body. In CT angiography, a contrast material is injected through an IV to help visualize the blood vessels    Your physician has requested that you have an echocardiogram. Echocardiography is a painless test that uses sound waves to create images of your heart. It provides your doctor with information about the size and shape of your heart and how well your hearts chambers and valves are working. This procedure takes approximately one hour. There are no restrictions for this procedure.   Follow-Up: At [facility], you and your health needs are our priority.  As part of our continuing mission to provide you with exceptional heart care, we have created designated Provider Care Teams.  These Care Teams include your primary Cardiologist (physician) and Advanced Practice Providers (APPs -  Physician Assistants and Nurse Practitioners) who all work together to provide you with the care you need, when you need it.  We recommend signing up for the patient portal called "MyChart".  Sign up information is provided on this After Visit Summary.  MyChart is used to connect with patients  for Virtual Visits (Telemedicine).  Patients are able to view lab/test results, encounter notes, upcoming appointments, etc.  Non-urgent messages can be sent to your provider as well.   To learn more about what you can do with MyChart, go to [email or web address].    Your next appointment:   1-2 month(s)  The format for your next appointment:   In Person  Provider:   You will see one of the following Advanced Practice Providers on your designated Care Team:   Tommye Standard, [place]   Other Instructions     Your cardiac CT will be scheduled at one of the below locations:   [facility] [address] [place], [place] [address] [phone]  [place] [address] [place], [place] [address] [phone]  If scheduled at [facility], please arrive at the [facility] main entrance (entrance A) of [facility] 30 minutes prior to test start time. You can use the FREE valet parking offered at the main entrance (encouraged to control the heart rate for the test) Proceed to the [facility] Radiology Department (first floor) to check-in and test prep.  If scheduled at [facility], please arrive 15 mins early for check-in and test prep.  Please follow these instructions carefully (unless otherwise directed):   On the Night Before the Test: Be sure to Drink plenty of water. Do not consume any caffeinated/decaffeinated beverages or chocolate 12 hours prior to your test. Do not take any antihistamines 12  hours prior to your test. IOn the Day of the Test: Drink plenty of water until 1 hour prior to the test. Do not eat any food 4 hours prior to the test. You may take your regular medications prior to the test.  Take metoprolol (Lopressor) two hours prior to test. HOLD Furosemide/Hydrochlorothiazide morning of the test. FEMALES- please wear underwire-free bra if available, avoid  dresses & tight clothing  After the Test: Drink plenty of water. After receiving IV contrast, you may experience a mild flushed feeling. This is normal. On occasion, you may experience a mild rash up to 24 hours after the test. This is not dangerous. If this occurs, you can take Benadryl 25 mg and increase your fluid intake. If you experience trouble breathing, this can be serious. If it is severe call 911 IMMEDIATELY. If it is mild, please call our office. If you take any of these medications: Glipizide/Metformin, Avandament, Glucavance, please do not take 48 hours after completing test unless otherwise instructed.  Please allow 2-4 weeks for scheduling of routine cardiac CTs. Some insurance companies require a pre-authorization which may delay scheduling of this test.   For non-scheduling related questions, please contact the cardiac imaging nurse navigator should you have any questions/concerns: Marchia Bond, Cardiac Imaging Nurse Navigator Gordy Clement, Cardiac Imaging Nurse Navigator  Heart and Vascular Services Direct Office Dial: [phone]   For scheduling needs, including cancellations and rescheduling, please call [place], [phone].

## 2021-01-07 LAB — BASIC METABOLIC PANEL
BUN/Creatinine Ratio: 24 (ref 12–28)
BUN: 18 mg/dL (ref 8–27)
CO2: 18 mmol/L — ABNORMAL LOW (ref 20–29)
Calcium: 9.4 mg/dL (ref 8.7–10.3)
Chloride: 97 mmol/L (ref 96–106)
Creatinine, Ser: 0.75 mg/dL (ref 0.57–1.00)
Glucose: 103 mg/dL — ABNORMAL HIGH (ref 70–99)
Potassium: 3.9 mmol/L (ref 3.5–5.2)
Sodium: 138 mmol/L (ref 134–144)
eGFR: 89 mL/min/{1.73_m2} (ref >59)

## 2021-01-07 LAB — CBC
Hematocrit: 40.3 % (ref 34.0–46.6)
Hemoglobin: 13.4 g/dL (ref 11.1–15.9)
MCH: 28.3 pg (ref 26.6–33.0)
MCHC: 33.3 g/dL (ref 31.5–35.7)
MCV: 85 fL (ref 79–97)
Platelets: 300 10*3/uL (ref 150–450)
RBC: 4.73 x10E6/uL (ref 3.77–5.28)
RDW: 13.6 % (ref 11.7–15.4)
WBC: 7.9 10*3/uL (ref 3.4–10.8)

## 2021-01-18 ENCOUNTER — Telehealth: Payer: Self-pay | Admitting: Physician Assistant

## 2021-01-18 NOTE — Telephone Encounter (Signed)
[employer] called. They wanted to document in the patient's chart that the company attempted to contact the patient multiple times to discuss the financial details of the patient's heart monitor. [employer] was unsuccessful at reaching the patient.  If the patient would like to contact the company, she can call [employer] at [phone]

## 2021-01-23 ENCOUNTER — Other Ambulatory Visit: Payer: Self-pay | Admitting: Internal Medicine

## 2021-01-24 ENCOUNTER — Telehealth (HOSPITAL_COMMUNITY): Payer: Self-pay | Admitting: Emergency Medicine

## 2021-01-24 NOTE — Telephone Encounter (Signed)
Attempted to call patient regarding upcoming cardiac CT appointment. °Left message on voicemail with name and callback number °Domenique Southers RN Navigator Cardiac Imaging ° Heart [facility] V[facility] Services °[phone] Office °[phone] Cell ° °

## 2021-01-24 NOTE — Telephone Encounter (Signed)
Reaching out to patient to offer assistance regarding upcoming cardiac imaging study; pt verbalizes understanding of appt date/time, parking situation and where to check in, pre-test NPO status and medications ordered, and verified current allergies; name and call back number provided for further questions should they arise Marchia Bond RN Navigator Cardiac Imaging [facility] [phone] office [phone] cell  Denies iv issues 100mg  metoprolol tart PPM base rate 60bpm Holding migraine meds with caffeine

## 2021-01-25 ENCOUNTER — Encounter (HOSPITAL_COMMUNITY): Payer: Self-pay

## 2021-01-25 ENCOUNTER — Other Ambulatory Visit: Payer: Self-pay

## 2021-01-25 ENCOUNTER — Ambulatory Visit (HOSPITAL_COMMUNITY)
Admission: RE | Admit: 2021-01-25 | Discharge: 2021-01-25 | Disposition: A | Discharge status: Home / Self Care | Payer: Managed Care, Other (non HMO) | Source: Ambulatory Visit | Attending: Physician Assistant | Admitting: Physician Assistant

## 2021-01-25 DIAGNOSIS — I2510 Atherosclerotic heart disease of native coronary artery without angina pectoris: Secondary | ICD-10-CM

## 2021-01-25 DIAGNOSIS — R079 Chest pain, unspecified: Secondary | ICD-10-CM | POA: Diagnosis not present

## 2021-01-25 MED ORDER — IOHEXOL 350 MG/ML SOLN
95.0000 mL | Freq: Once | INTRAVENOUS | Status: AC | PRN
  Administered 2021-01-25: 95 mL via INTRAVENOUS

## 2021-01-25 MED ORDER — NITROGLYCERIN 0.4 MG SL SUBL
SUBLINGUAL_TABLET | SUBLINGUAL | Status: AC
  Filled 2021-01-25: qty 2

## 2021-01-25 MED ORDER — NITROGLYCERIN 0.4 MG SL SUBL
0.8000 mg | SUBLINGUAL_TABLET | Freq: Once | SUBLINGUAL | Status: AC
  Administered 2021-01-25: 0.8 mg via SUBLINGUAL

## 2021-01-31 ENCOUNTER — Telehealth: Payer: Self-pay | Admitting: *Deleted

## 2021-01-31 NOTE — Telephone Encounter (Signed)
-----   Message from [place], [place] sent at 01/26/2021  4:31 PM EST ----- She has some calcium but no blockages to explain her CP. She should follow up with her PMD to evaluate further Risk reduction is important, cholesterol control, heart healthy lifestyle. If her PMD is not checking lipids, she should come in for fasting lipid/hepatic panels  Await her monitor findings

## 2021-01-31 NOTE — Telephone Encounter (Signed)
Lvm  for patient to call back about results. 

## 2021-02-01 ENCOUNTER — Inpatient Hospital Stay: Admission: RE | Admit: 2021-02-01 | Payer: Managed Care, Other (non HMO) | Source: Ambulatory Visit

## 2021-02-06 ENCOUNTER — Ambulatory Visit (INDEPENDENT_AMBULATORY_CARE_PROVIDER_SITE_OTHER): Payer: Managed Care, Other (non HMO)

## 2021-02-06 ENCOUNTER — Ambulatory Visit (HOSPITAL_COMMUNITY): Payer: Managed Care, Other (non HMO) | Attending: Cardiology

## 2021-02-06 ENCOUNTER — Other Ambulatory Visit: Payer: Self-pay

## 2021-02-06 DIAGNOSIS — R079 Chest pain, unspecified: Secondary | ICD-10-CM | POA: Insufficient documentation

## 2021-02-06 DIAGNOSIS — I495 Sick sinus syndrome: Secondary | ICD-10-CM | POA: Diagnosis not present

## 2021-02-06 DIAGNOSIS — I429 Cardiomyopathy, unspecified: Secondary | ICD-10-CM | POA: Insufficient documentation

## 2021-02-06 DIAGNOSIS — I428 Other cardiomyopathies: Secondary | ICD-10-CM | POA: Diagnosis not present

## 2021-02-06 LAB — CUP PACEART REMOTE DEVICE CHECK
Battery Remaining Longevity: 83 mo
Battery Remaining Percentage: 69 %
Battery Voltage: 3.01 V
Brady Statistic RA Percent Paced: 91 %
Date Time Interrogation Session: 20230116020018
Implantable Lead Implant Date: 20181018
Implantable Lead Implant Date: 20181018
Implantable Lead Location: 753859
Implantable Lead Location: 753860
Implantable Pulse Generator Implant Date: 20181018
Lead Channel Impedance Value: 410 Ohm
Lead Channel Pacing Threshold Amplitude: 0.5 V
Lead Channel Pacing Threshold Pulse Width: 0.5 ms
Lead Channel Sensing Intrinsic Amplitude: 5 mV
Lead Channel Setting Pacing Amplitude: 2 V
Pulse Gen Model: 2272
Pulse Gen Serial Number: 8957639

## 2021-02-06 LAB — ECHOCARDIOGRAM COMPLETE
Area-P 1/2: 3.92 cm2
S' Lateral: 3.5 cm

## 2021-02-07 ENCOUNTER — Other Ambulatory Visit: Payer: Self-pay | Admitting: Internal Medicine

## 2021-02-16 NOTE — Progress Notes (Signed)
Remote pacemaker transmission.   
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
## 2021-03-01 ENCOUNTER — Other Ambulatory Visit: Payer: Self-pay

## 2021-03-01 ENCOUNTER — Encounter: Payer: Self-pay | Admitting: Physician Assistant

## 2021-03-01 ENCOUNTER — Ambulatory Visit: Payer: Managed Care, Other (non HMO) | Admitting: Physician Assistant

## 2021-03-01 VITALS — BP 138/80 | HR 67 | Ht 70.0 in | Wt 256.2 lb

## 2021-03-01 DIAGNOSIS — I495 Sick sinus syndrome: Secondary | ICD-10-CM

## 2021-03-01 DIAGNOSIS — Z950 Presence of cardiac pacemaker: Secondary | ICD-10-CM

## 2021-03-01 DIAGNOSIS — I480 Paroxysmal atrial fibrillation: Secondary | ICD-10-CM

## 2021-03-01 DIAGNOSIS — I214 Non-ST elevation (NSTEMI) myocardial infarction: Secondary | ICD-10-CM

## 2021-03-01 DIAGNOSIS — R42 Dizziness and giddiness: Secondary | ICD-10-CM

## 2021-03-01 DIAGNOSIS — I10 Essential (primary) hypertension: Secondary | ICD-10-CM | POA: Diagnosis not present

## 2021-03-01 LAB — CUP PACEART INCLINIC DEVICE CHECK
Battery Remaining Longevity: 79 mo
Battery Voltage: 2.99 V
Brady Statistic RA Percent Paced: 91 %
Brady Statistic RV Percent Paced: 0 %
Date Time Interrogation Session: 20230208191239
Implantable Lead Implant Date: 20181018
Implantable Lead Implant Date: 20181018
Implantable Lead Location: 753859
Implantable Lead Location: 753860
Implantable Pulse Generator Implant Date: 20181018
Lead Channel Impedance Value: 412.5 Ohm
Lead Channel Impedance Value: 450 Ohm
Lead Channel Pacing Threshold Amplitude: 0.5 V
Lead Channel Pacing Threshold Amplitude: 0.5 V
Lead Channel Pacing Threshold Amplitude: 0.625 V
Lead Channel Pacing Threshold Pulse Width: 0.5 ms
Lead Channel Pacing Threshold Pulse Width: 0.5 ms
Lead Channel Pacing Threshold Pulse Width: 0.5 ms
Lead Channel Sensing Intrinsic Amplitude: 12 mV
Lead Channel Sensing Intrinsic Amplitude: 5 mV
Lead Channel Setting Pacing Amplitude: 2 V
Pulse Gen Model: 2272
Pulse Gen Serial Number: 8957639

## 2021-03-01 MED ORDER — CHLORTHALIDONE 25 MG PO TABS: 12.5000 mg | ORAL_TABLET | Freq: Every day | ORAL | 1 refills | Status: DC

## 2021-03-01 NOTE — Patient Instructions (Signed)
Medication Instructions:   CHLORTHALIDONE 12.5 MG ONCE A DAY    *If you need a refill on your cardiac medications before your next appointment, please call your pharmacy*   Lab Work: NONE ORDERED  TODAY   If you have labs (blood work) drawn today and your tests are completely normal, you will receive your results only by: [place] (if you have MyChart) OR A paper copy in the mail If you have any lab test that is abnormal or we need to change your treatment, we will call you to review the results.   Testing/Procedures: NONE ORDERED  TODAY     Follow-Up: At [facility], you and your health needs are our priority.  As part of our continuing mission to provide you with exceptional heart care, we have created designated Provider Care Teams.  These Care Teams include your primary Cardiologist (physician) and Advanced Practice Providers (APPs -  Physician Assistants and Nurse Practitioners) who all work together to provide you with the care you need, when you need it.  We recommend signing up for the patient portal called "MyChart".  Sign up information is provided on this After Visit Summary.  MyChart is used to connect with patients for Virtual Visits (Telemedicine).  Patients are able to view lab/test results, encounter notes, upcoming appointments, etc.  Non-urgent messages can be sent to your provider as well.   To learn more about what you can do with MyChart, go to [email or web address].    Your next appointment:   3 month(s)  The format for your next appointment:   In Person  Provider:   Tommye Standard, PA-C   Other Instructions

## 2021-04-11 ENCOUNTER — Other Ambulatory Visit: Payer: Self-pay | Admitting: Internal Medicine

## 2021-04-11 DIAGNOSIS — R928 Other abnormal and inconclusive findings on diagnostic imaging of breast: Secondary | ICD-10-CM

## 2021-04-11 DIAGNOSIS — Z1231 Encounter for screening mammogram for malignant neoplasm of breast: Secondary | ICD-10-CM

## 2021-04-11 DIAGNOSIS — N63 Unspecified lump in unspecified breast: Secondary | ICD-10-CM

## 2021-04-12 ENCOUNTER — Other Ambulatory Visit: Payer: Self-pay | Admitting: Gerontology

## 2021-05-01 ENCOUNTER — Ambulatory Visit
Admission: RE | Admit: 2021-05-01 | Discharge: 2021-05-01 | Disposition: A | Discharge status: Home / Self Care | Payer: Managed Care, Other (non HMO) | Source: Ambulatory Visit | Attending: Internal Medicine | Admitting: Internal Medicine

## 2021-05-01 DIAGNOSIS — R928 Other abnormal and inconclusive findings on diagnostic imaging of breast: Secondary | ICD-10-CM

## 2021-05-08 ENCOUNTER — Other Ambulatory Visit: Payer: Self-pay | Admitting: Internal Medicine

## 2021-05-08 ENCOUNTER — Ambulatory Visit (INDEPENDENT_AMBULATORY_CARE_PROVIDER_SITE_OTHER): Payer: Managed Care, Other (non HMO)

## 2021-05-08 DIAGNOSIS — N63 Unspecified lump in unspecified breast: Secondary | ICD-10-CM

## 2021-05-08 DIAGNOSIS — I495 Sick sinus syndrome: Secondary | ICD-10-CM

## 2021-05-08 DIAGNOSIS — R928 Other abnormal and inconclusive findings on diagnostic imaging of breast: Secondary | ICD-10-CM

## 2021-05-09 LAB — CUP PACEART REMOTE DEVICE CHECK
Battery Remaining Longevity: 81 mo
Battery Remaining Percentage: 67 %
Battery Voltage: 2.99 V
Brady Statistic RA Percent Paced: 86 %
Date Time Interrogation Session: 20230417025734
Implantable Lead Implant Date: 20181018
Implantable Lead Implant Date: 20181018
Implantable Lead Location: 753859
Implantable Lead Location: 753860
Implantable Pulse Generator Implant Date: 20181018
Lead Channel Impedance Value: 410 Ohm
Lead Channel Pacing Threshold Amplitude: 0.5 V
Lead Channel Pacing Threshold Pulse Width: 0.5 ms
Lead Channel Sensing Intrinsic Amplitude: 4.6 mV
Lead Channel Setting Pacing Amplitude: 2 V
Pulse Gen Model: 2272
Pulse Gen Serial Number: 8957639

## 2021-05-23 ENCOUNTER — Ambulatory Visit
Admission: RE | Admit: 2021-05-23 | Discharge: 2021-05-23 | Disposition: A | Discharge status: Home / Self Care | Payer: Managed Care, Other (non HMO) | Source: Ambulatory Visit | Attending: Internal Medicine | Admitting: Internal Medicine

## 2021-05-23 DIAGNOSIS — N63 Unspecified lump in unspecified breast: Secondary | ICD-10-CM

## 2021-05-23 DIAGNOSIS — R928 Other abnormal and inconclusive findings on diagnostic imaging of breast: Secondary | ICD-10-CM | POA: Diagnosis not present

## 2021-05-23 HISTORY — PX: BREAST BIOPSY: SHX20

## 2021-05-24 LAB — SURGICAL PATHOLOGY

## 2021-05-24 NOTE — Progress Notes (Signed)
Remote pacemaker transmission.   

## 2021-06-06 ENCOUNTER — Encounter: Payer: Managed Care, Other (non HMO) | Admitting: Physician Assistant
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
## 2021-06-21 ENCOUNTER — Encounter: Payer: Self-pay | Admitting: Physician Assistant

## 2021-06-21 ENCOUNTER — Ambulatory Visit: Payer: Managed Care, Other (non HMO) | Admitting: Physician Assistant

## 2021-06-21 VITALS — BP 151/78 | HR 65 | Resp 16 | Ht 70.0 in | Wt 252.6 lb

## 2021-06-21 DIAGNOSIS — I10 Essential (primary) hypertension: Secondary | ICD-10-CM

## 2021-06-21 DIAGNOSIS — I480 Paroxysmal atrial fibrillation: Secondary | ICD-10-CM | POA: Diagnosis not present

## 2021-06-21 DIAGNOSIS — I5181 Takotsubo syndrome: Secondary | ICD-10-CM

## 2021-06-21 DIAGNOSIS — Z950 Presence of cardiac pacemaker: Secondary | ICD-10-CM

## 2021-06-21 DIAGNOSIS — R0789 Other chest pain: Secondary | ICD-10-CM

## 2021-06-21 LAB — CUP PACEART INCLINIC DEVICE CHECK
Battery Remaining Longevity: 75 mo
Battery Voltage: 2.99 V
Brady Statistic RA Percent Paced: 90 %
Brady Statistic RV Percent Paced: 0 %
Date Time Interrogation Session: 20230531165625
Implantable Lead Implant Date: 20181018
Implantable Lead Implant Date: 20181018
Implantable Lead Location: 753859
Implantable Lead Location: 753860
Implantable Pulse Generator Implant Date: 20181018
Lead Channel Impedance Value: 412.5 Ohm
Lead Channel Impedance Value: 487.5 Ohm
Lead Channel Pacing Threshold Amplitude: 0.5 V
Lead Channel Pacing Threshold Amplitude: 0.5 V
Lead Channel Pacing Threshold Amplitude: 0.625 V
Lead Channel Pacing Threshold Pulse Width: 0.5 ms
Lead Channel Pacing Threshold Pulse Width: 0.5 ms
Lead Channel Pacing Threshold Pulse Width: 0.5 ms
Lead Channel Sensing Intrinsic Amplitude: 12 mV
Lead Channel Sensing Intrinsic Amplitude: 5 mV
Lead Channel Setting Pacing Amplitude: 2 V
Pulse Gen Model: 2272
Pulse Gen Serial Number: 8957639

## 2021-06-21 MED ORDER — CHLORTHALIDONE 25 MG PO TABS: 25.0000 mg | ORAL_TABLET | ORAL | 2 refills | Status: AC

## 2021-06-21 NOTE — Patient Instructions (Signed)
Medication Instructions:    START TAKING CHLORTHALIDONE 25 MG EVERY OTHER DAY    *If you need a refill on your cardiac medications before your next appointment, please call your pharmacy*   Lab Work: NONE ORDERED  TODAY   If you have labs (blood work) drawn today and your tests are completely normal, you will receive your results only by: [place] (if you have MyChart) OR A paper copy in the mail If you have any lab test that is abnormal or we need to change your treatment, we will call you to review the results.   Testing/Procedures: NONE ORDERED  TODAY    Follow-Up: At [facility], you and your health needs are our priority.  As part of our continuing mission to provide you with exceptional heart care, we have created designated Provider Care Teams.  These Care Teams include your primary Cardiologist (physician) and Advanced Practice Providers (APPs -  Physician Assistants and Nurse Practitioners) who all work together to provide you with the care you need, when you need it.  We recommend signing up for the patient portal called "MyChart".  Sign up information is provided on this After Visit Summary.  MyChart is used to connect with patients for Virtual Visits (Telemedicine).  Patients are able to view lab/test results, encounter notes, upcoming appointments, etc.  Non-urgent messages can be sent to your provider as well.   To learn more about what you can do with MyChart, go to [email or web address].    Your next appointment:   6 month(s)  The format for your next appointment:   In Person  Provider:   Tommye Standard, PA-C    Other Instructions   Important Information About Sugar

## 2021-08-07 ENCOUNTER — Ambulatory Visit (INDEPENDENT_AMBULATORY_CARE_PROVIDER_SITE_OTHER): Payer: Managed Care, Other (non HMO)

## 2021-08-07 DIAGNOSIS — I495 Sick sinus syndrome: Secondary | ICD-10-CM | POA: Diagnosis not present

## 2021-08-08 LAB — CUP PACEART REMOTE DEVICE CHECK
Battery Remaining Longevity: 78 mo
Battery Remaining Percentage: 65 %
Battery Voltage: 2.99 V
Brady Statistic RA Percent Paced: 97 %
Date Time Interrogation Session: 20230717033739
Implantable Lead Implant Date: 20181018
Implantable Lead Implant Date: 20181018
Implantable Lead Location: 753859
Implantable Lead Location: 753860
Implantable Pulse Generator Implant Date: 20181018
Lead Channel Impedance Value: 430 Ohm
Lead Channel Pacing Threshold Amplitude: 0.5 V
Lead Channel Pacing Threshold Pulse Width: 0.5 ms
Lead Channel Sensing Intrinsic Amplitude: 5 mV
Lead Channel Setting Pacing Amplitude: 2 V
Pulse Gen Model: 2272
Pulse Gen Serial Number: 8957639

## 2021-09-08 NOTE — Progress Notes (Signed)
Remote pacemaker transmission.   

## 2021-11-06 ENCOUNTER — Ambulatory Visit (INDEPENDENT_AMBULATORY_CARE_PROVIDER_SITE_OTHER): Payer: Managed Care, Other (non HMO)

## 2021-11-06 DIAGNOSIS — I495 Sick sinus syndrome: Secondary | ICD-10-CM | POA: Diagnosis not present

## 2021-11-07 LAB — CUP PACEART REMOTE DEVICE CHECK
Battery Remaining Longevity: 74 mo
Battery Remaining Percentage: 62 %
Battery Voltage: 2.99 V
Brady Statistic RA Percent Paced: 98 %
Date Time Interrogation Session: 20231016042354
Implantable Lead Implant Date: 20181018
Implantable Lead Implant Date: 20181018
Implantable Lead Location: 753859
Implantable Lead Location: 753860
Implantable Pulse Generator Implant Date: 20181018
Lead Channel Impedance Value: 410 Ohm
Lead Channel Pacing Threshold Amplitude: 0.5 V
Lead Channel Pacing Threshold Pulse Width: 0.5 ms
Lead Channel Sensing Intrinsic Amplitude: 5 mV
Lead Channel Setting Pacing Amplitude: 2 V
Pulse Gen Model: 2272
Pulse Gen Serial Number: 8957639

## 2021-11-08 ENCOUNTER — Other Ambulatory Visit: Payer: Self-pay

## 2021-11-08 ENCOUNTER — Emergency Department: Payer: Managed Care, Other (non HMO)

## 2021-11-08 ENCOUNTER — Emergency Department
Admission: EM | Admit: 2021-11-08 | Discharge: 2021-11-08 | Disposition: A | Discharge status: Home / Self Care | Payer: Managed Care, Other (non HMO) | Attending: Emergency Medicine | Admitting: Emergency Medicine

## 2021-11-08 DIAGNOSIS — I2510 Atherosclerotic heart disease of native coronary artery without angina pectoris: Secondary | ICD-10-CM | POA: Insufficient documentation

## 2021-11-08 DIAGNOSIS — I10 Essential (primary) hypertension: Secondary | ICD-10-CM | POA: Insufficient documentation

## 2021-11-08 DIAGNOSIS — R748 Abnormal levels of other serum enzymes: Secondary | ICD-10-CM | POA: Insufficient documentation

## 2021-11-08 DIAGNOSIS — R110 Nausea: Secondary | ICD-10-CM | POA: Insufficient documentation

## 2021-11-08 DIAGNOSIS — R1031 Right lower quadrant pain: Secondary | ICD-10-CM | POA: Insufficient documentation

## 2021-11-08 LAB — COMPREHENSIVE METABOLIC PANEL
ALT: 23 U/L (ref 0–44)
AST: 28 U/L (ref 15–41)
Albumin: 4.2 g/dL (ref 3.5–5.0)
Alkaline Phosphatase: 79 U/L (ref 38–126)
Anion gap: 7 (ref 5–15)
BUN: 17 mg/dL (ref 8–23)
CO2: 24 mmol/L (ref 22–32)
Calcium: 9.4 mg/dL (ref 8.9–10.3)
Chloride: 104 mmol/L (ref 98–111)
Creatinine, Ser: 0.96 mg/dL (ref 0.44–1.00)
GFR, Estimated: >60 mL/min (ref >60)
Glucose, Bld: 109 mg/dL — ABNORMAL HIGH (ref 70–99)
Potassium: 3.8 mmol/L (ref 3.5–5.1)
Sodium: 135 mmol/L (ref 135–145)
Total Bilirubin: 0.7 mg/dL (ref 0.3–1.2)
Total Protein: 7.7 g/dL (ref 6.5–8.1)

## 2021-11-08 LAB — URINALYSIS, ROUTINE W REFLEX MICROSCOPIC
Bacteria, UA: NONE SEEN
Bilirubin Urine: NEGATIVE
Glucose, UA: NEGATIVE mg/dL
Hgb urine dipstick: NEGATIVE
Ketones, ur: NEGATIVE mg/dL
Nitrite: NEGATIVE
Protein, ur: NEGATIVE mg/dL
Specific Gravity, Urine: 1.034 — ABNORMAL HIGH (ref 1.005–1.030)
Squamous Epithelial / HPF: NONE SEEN (ref 0–5)
pH: 5 (ref 5.0–8.0)

## 2021-11-08 LAB — CBC
HCT: 40.8 % (ref 36.0–46.0)
Hemoglobin: 13 g/dL (ref 12.0–15.0)
MCH: 27.8 pg (ref 26.0–34.0)
MCHC: 31.9 g/dL (ref 30.0–36.0)
MCV: 87.4 fL (ref 80.0–100.0)
Platelets: 252 10*3/uL (ref 150–400)
RBC: 4.67 MIL/uL (ref 3.87–5.11)
RDW: 13.9 % (ref 11.5–15.5)
WBC: 8.6 10*3/uL (ref 4.0–10.5)
nRBC: 0 % (ref 0.0–0.2)

## 2021-11-08 LAB — LIPASE, BLOOD: Lipase: 60 U/L — ABNORMAL HIGH (ref 11–51)

## 2021-11-08 MED ORDER — TRAMADOL HCL 50 MG PO TABS
50.0000 mg | ORAL_TABLET | Freq: Once | ORAL | Status: AC
  Administered 2021-11-08: 50 mg via ORAL
  Filled 2021-11-08: qty 1

## 2021-11-08 MED ORDER — TRAMADOL HCL 50 MG PO TABS: 50.0000 mg | ORAL_TABLET | Freq: Four times a day (QID) | ORAL | 0 refills | Status: AC | PRN

## 2021-11-08 MED ORDER — IOHEXOL 300 MG/ML  SOLN
100.0000 mL | Freq: Once | INTRAMUSCULAR | Status: AC | PRN
  Administered 2021-11-08: 100 mL via INTRAVENOUS

## 2021-11-08 NOTE — ED Provider Notes (Signed)
[facility] Provider Note    Event Date/Time   First MD Initiated Contact with Patient 11/08/21 1826     (approximate)   History   Abdominal Pain   HPI  Renee [name] is a 66 y.o. female with history of atrial fibrillation, CAD, hypertension, and remote history of malrotation and bowel obstruction more than 15 years ago who presents with right lower quadrant pain for the last week, initially mild and intermittent, now more constant and worsened in the last 2 days.  She reports associated nausea but no vomiting.  She denies any change in her bowel movements.  She states that she has had some intermittent mild rectal bleeding over the last several months that she is being worked up by her regular doctor for.  She has not had any acute bleeding in the last several days.  She has no urinary symptoms, vaginal bleeding, fever, or other acute symptoms.   Physical Exam   Triage Vital Signs: ED Triage Vitals [11/08/21 1513]  Enc Vitals Group     BP (!) 162/85     Pulse Rate 64     Resp 18     Temp 98.4 F (36.9 C)     Temp Source Oral     SpO2 99 %     Weight 252 lb (114.3 kg)     Height      Head Circumference      Peak Flow      Pain Score 8     Pain Loc      Pain Edu?      Excl. in [place]?     Most recent vital signs: Vitals:   11/08/21 1513 11/08/21 1942  BP: (!) 162/85 (!) 153/71  Pulse: 64 62  Resp: 18 16  Temp: 98.4 F (36.9 C)   SpO2: 99% 100%     General: Awake, no distress.  CV:  Good peripheral perfusion.  Resp:  Normal effort.  Abd:  Mild right lower quadrant tenderness with no peritoneal signs.  No distention.  Other:  No scleral icterus.  Moist mucous membranes.   ED Results / Procedures / Treatments   Labs (all labs ordered are listed, but only abnormal results are displayed) Labs Reviewed  LIPASE, BLOOD - Abnormal; Notable for the following components:      Result Value   Lipase 60 (*)    All other components within  normal limits  COMPREHENSIVE METABOLIC PANEL - Abnormal; Notable for the following components:   Glucose, Bld 109 (*)    All other components within normal limits  URINALYSIS, ROUTINE W REFLEX MICROSCOPIC - Abnormal; Notable for the following components:   Color, Urine STRAW (*)    APPearance CLEAR (*)    Specific Gravity, Urine 1.034 (*)    Leukocytes,Ua TRACE (*)    All other components within normal limits  CBC     EKG     RADIOLOGY  CT abdomen/pelvis: I independently viewed and interpreted the images; there are no dilated bowel loops, free fluid, or other acute abnormalities.   PROCEDURES:  Critical Care performed: No  Procedures   MEDICATIONS ORDERED IN ED: Medications  iohexol (OMNIPAQUE) 300 MG/ML solution 100 mL (100 mLs Intravenous Contrast Given 11/08/21 1615)  traMADol (ULTRAM) tablet 50 mg (50 mg Oral Given 11/08/21 1937)     IMPRESSION / MDM / ASSESSMENT AND PLAN / ED COURSE  I reviewed the triage vital signs and the nursing notes.  66 year old female  with PMH as noted above presents with right lower quadrant pain for the last week which has worsened slightly in the last few days.  She has no significant associated symptoms.  On exam she is mildly tender but appears well and has normal vital signs.  I reviewed the past medical records.  The patient was most recently admitted in March 2020 and per the hospitalist discharge summary she presented with chest pain and was found to have an NSTEMI.  She is status post appendectomy and cholecystectomy.  Differential diagnosis includes, but is not limited to, colitis, diverticulitis, SBO, volvulus, kidney stone, UTI/pyelonephritis, mesenteric adenitis, gas pain, other nonspecific etiology.  We will obtain lab work-up, urinalysis, CT abdomen, and reassess.  Patient's presentation is most consistent with acute presentation with potential threat to life or bodily  function.  ----------------------------------------- 8:00 PM on 11/08/2021 -----------------------------------------  Lab work-up is unremarkable except for lipase which is minimally elevated although the patient states that she has some chronic issues with her pancreas after her prior surgery and she has no pain in this area.  There is no clinical evidence for pancreatitis.  There is no leukocytosis.  LFTs are normal.  Urinalysis is negative.  CT shows no acute findings.  Although the exact etiology of the patient's pain is unclear, given the patient's well appearance, reassuring exam, and entirely negative work-up, she is stable for discharge home with close outpatient follow-up.  The patient is reassured and states she feels comfortable to go home.  I will prescribe a small quantity of pain medication.  She will follow-up with her PMD.  I gave her strict return precautions and she expresses understanding.    FINAL CLINICAL IMPRESSION(S) / ED DIAGNOSES   Final diagnoses:  Right lower quadrant abdominal pain     Rx / DC Orders   ED Discharge Orders          Ordered    traMADol (ULTRAM) 50 MG tablet  Every 6 hours PRN        11/08/21 1927             Note:  This document was prepared using Dragon voice recognition software and may include unintentional dictation errors.    Arta Silence, MD 11/08/21 [phone]

## 2021-11-08 NOTE — ED Triage Notes (Addendum)
Pt arrives with c/o RLQ ABD pain that started last week. Pt endorses nausea and blodoy stool. Pt denies fevers.

## 2021-11-08 NOTE — Discharge Instructions (Addendum)
Return to the ER for new, worsening, or persistent severe abdominal pain, nausea or vomiting, fever, diarrhea, blood in the stool, or any other new or worsening symptoms that concern you.  Follow-up with Dr. Caryl Comes.

## 2021-11-08 NOTE — ED Provider Triage Note (Signed)
Emergency Medicine Provider Triage Evaluation Note  Renee [name] , a 66 y.o. female  was evaluated in triage.  Pt complains of right lower quadrant pain..  Review of Systems  Positive:  Negative:   Physical Exam  BP (!) 162/85   Pulse 64   Temp 98.4 F (36.9 C) (Oral)   Resp 18   Wt 114.3 kg   LMP  (LMP Unknown)   SpO2 99%   BMI 36.16 kg/m  Gen:   Awake, no distress   Resp:  Normal effort  MSK:   Moves extremities without difficulty  Other:  Very tender in the right lower quadrant  Medical Decision Making  Medically screening exam initiated at 3:25 PM.  Appropriate orders placed.  Renee [name] was informed that the remainder of the evaluation will be completed by another provider, this initial triage assessment does not replace that evaluation, and the importance of remaining in the ED until their evaluation is complete.     Versie Starks, PA-C 11/08/21 1525

## 2021-11-08 NOTE — ED Notes (Signed)
Provided cup for UA.

## 2021-11-24 NOTE — Progress Notes (Signed)
Remote pacemaker transmission.   

## 2022-02-05 ENCOUNTER — Ambulatory Visit (INDEPENDENT_AMBULATORY_CARE_PROVIDER_SITE_OTHER): Payer: Managed Care, Other (non HMO)

## 2022-02-05 DIAGNOSIS — I495 Sick sinus syndrome: Secondary | ICD-10-CM | POA: Diagnosis not present

## 2022-02-06 LAB — CUP PACEART REMOTE DEVICE CHECK
Battery Remaining Longevity: 73 mo
Battery Remaining Percentage: 60 %
Battery Voltage: 2.99 V
Brady Statistic RA Percent Paced: 98 %
Date Time Interrogation Session: 20240115120604
Implantable Lead Connection Status: 753985
Implantable Lead Connection Status: 753985
Implantable Lead Implant Date: 20181018
Implantable Lead Implant Date: 20181018
Implantable Lead Location: 753859
Implantable Lead Location: 753860
Implantable Pulse Generator Implant Date: 20181018
Lead Channel Impedance Value: 430 Ohm
Lead Channel Pacing Threshold Amplitude: 0.5 V
Lead Channel Pacing Threshold Pulse Width: 0.5 ms
Lead Channel Sensing Intrinsic Amplitude: 5 mV
Lead Channel Setting Pacing Amplitude: 2 V
Pulse Gen Model: 2272
Pulse Gen Serial Number: 8957639

## 2022-02-09 ENCOUNTER — Other Ambulatory Visit: Payer: Self-pay | Admitting: Internal Medicine

## 2022-03-01 ENCOUNTER — Encounter: Payer: Self-pay | Admitting: Gastroenterology

## 2022-03-02 ENCOUNTER — Ambulatory Visit: Payer: Managed Care, Other (non HMO) | Admitting: Anesthesiology

## 2022-03-02 ENCOUNTER — Ambulatory Visit
Admission: RE | Admit: 2022-03-02 | Discharge: 2022-03-02 | Disposition: A | Discharge status: Home / Self Care | Payer: Managed Care, Other (non HMO) | Source: Ambulatory Visit | Attending: Gastroenterology | Admitting: Gastroenterology

## 2022-03-02 ENCOUNTER — Encounter
Admission: RE | Disposition: A | Discharge status: Home / Self Care | Payer: Self-pay | Source: Ambulatory Visit | Attending: Gastroenterology

## 2022-03-02 ENCOUNTER — Encounter: Payer: Self-pay | Admitting: Gastroenterology

## 2022-03-02 DIAGNOSIS — D122 Benign neoplasm of ascending colon: Secondary | ICD-10-CM | POA: Diagnosis not present

## 2022-03-02 DIAGNOSIS — K219 Gastro-esophageal reflux disease without esophagitis: Secondary | ICD-10-CM | POA: Insufficient documentation

## 2022-03-02 DIAGNOSIS — Z950 Presence of cardiac pacemaker: Secondary | ICD-10-CM | POA: Diagnosis not present

## 2022-03-02 DIAGNOSIS — I2510 Atherosclerotic heart disease of native coronary artery without angina pectoris: Secondary | ICD-10-CM | POA: Insufficient documentation

## 2022-03-02 DIAGNOSIS — K648 Other hemorrhoids: Secondary | ICD-10-CM | POA: Insufficient documentation

## 2022-03-02 DIAGNOSIS — Z7901 Long term (current) use of anticoagulants: Secondary | ICD-10-CM | POA: Diagnosis not present

## 2022-03-02 DIAGNOSIS — Z9884 Bariatric surgery status: Secondary | ICD-10-CM | POA: Insufficient documentation

## 2022-03-02 DIAGNOSIS — D123 Benign neoplasm of transverse colon: Secondary | ICD-10-CM | POA: Insufficient documentation

## 2022-03-02 DIAGNOSIS — K2970 Gastritis, unspecified, without bleeding: Secondary | ICD-10-CM | POA: Diagnosis not present

## 2022-03-02 DIAGNOSIS — I10 Essential (primary) hypertension: Secondary | ICD-10-CM | POA: Insufficient documentation

## 2022-03-02 DIAGNOSIS — E755 Other lipid storage disorders: Secondary | ICD-10-CM | POA: Diagnosis not present

## 2022-03-02 DIAGNOSIS — J439 Emphysema, unspecified: Secondary | ICD-10-CM | POA: Diagnosis not present

## 2022-03-02 DIAGNOSIS — K317 Polyp of stomach and duodenum: Secondary | ICD-10-CM | POA: Diagnosis not present

## 2022-03-02 DIAGNOSIS — E785 Hyperlipidemia, unspecified: Secondary | ICD-10-CM | POA: Insufficient documentation

## 2022-03-02 DIAGNOSIS — K644 Residual hemorrhoidal skin tags: Secondary | ICD-10-CM | POA: Diagnosis not present

## 2022-03-02 DIAGNOSIS — D509 Iron deficiency anemia, unspecified: Secondary | ICD-10-CM | POA: Insufficient documentation

## 2022-03-02 DIAGNOSIS — K224 Dyskinesia of esophagus: Secondary | ICD-10-CM | POA: Diagnosis not present

## 2022-03-02 DIAGNOSIS — D120 Benign neoplasm of cecum: Secondary | ICD-10-CM | POA: Insufficient documentation

## 2022-03-02 DIAGNOSIS — D125 Benign neoplasm of sigmoid colon: Secondary | ICD-10-CM | POA: Diagnosis not present

## 2022-03-02 DIAGNOSIS — Z83719 Family history of colon polyps, unspecified: Secondary | ICD-10-CM | POA: Diagnosis not present

## 2022-03-02 DIAGNOSIS — Z980 Intestinal bypass and anastomosis status: Secondary | ICD-10-CM | POA: Diagnosis not present

## 2022-03-02 DIAGNOSIS — I4891 Unspecified atrial fibrillation: Secondary | ICD-10-CM | POA: Diagnosis not present

## 2022-03-02 DIAGNOSIS — D124 Benign neoplasm of descending colon: Secondary | ICD-10-CM | POA: Insufficient documentation

## 2022-03-02 DIAGNOSIS — K625 Hemorrhage of anus and rectum: Secondary | ICD-10-CM | POA: Diagnosis not present

## 2022-03-02 HISTORY — PX: ESOPHAGOGASTRODUODENOSCOPY: SHX5428

## 2022-03-02 HISTORY — PX: COLONOSCOPY: SHX5424

## 2022-03-02 SURGERY — COLONOSCOPY
Anesthesia: General

## 2022-03-02 MED ORDER — PROPOFOL 10 MG/ML IV BOLUS
INTRAVENOUS | Status: AC
  Filled 2022-03-02: qty 20

## 2022-03-02 MED ORDER — LIDOCAINE HCL (PF) 2 % IJ SOLN
INTRAMUSCULAR | Status: AC
  Filled 2022-03-02: qty 5

## 2022-03-02 MED ORDER — STERILE WATER FOR IRRIGATION IR SOLN
Status: DC | PRN
  Administered 2022-03-02 (×2): 60 mL

## 2022-03-02 MED ORDER — LIDOCAINE HCL (CARDIAC) PF 100 MG/5ML IV SOSY
PREFILLED_SYRINGE | INTRAVENOUS | Status: DC | PRN
  Administered 2022-03-02: 100 mg via INTRAVENOUS

## 2022-03-02 MED ORDER — PROPOFOL 10 MG/ML IV BOLUS
INTRAVENOUS | Status: DC | PRN
  Administered 2022-03-02: 120 mg via INTRAVENOUS
  Administered 2022-03-02: 120 ug/kg/min via INTRAVENOUS

## 2022-03-02 MED ORDER — SODIUM CHLORIDE 0.9 % IV SOLN
INTRAVENOUS | Status: DC
  Administered 2022-03-02: 1000 mL via INTRAVENOUS

## 2022-03-02 NOTE — Interval H&P Note (Signed)
History and Physical Interval Note: Preprocedure H&P from 03/02/22  was reviewed and there was no interval change after seeing and examining the patient.  Written consent was obtained from the patient after discussion of risks, benefits, and alternatives. Patient has consented to proceed with Esophagogastroduodenoscopy and Colonoscopy with possible intervention   03/02/2022 7:36 AM  Renee [name]  has presented today for surgery, with the diagnosis of Bright red rectal bleeding (K62.5) Iron deficiency anemia, unspecified iron deficiency anemia type (D50.9) Gastroesophageal reflux disease without esophagitis (K21.9) Dysphagia, unspecified type (R13.10) History of colon polyps (Z86.010).  The various methods of treatment have been discussed with the patient and family. After consideration of risks, benefits and other options for treatment, the patient has consented to  Procedure(s): COLONOSCOPY (N/A) ESOPHAGOGASTRODUODENOSCOPY (EGD) (N/A) as a surgical intervention.  The patient's history has been reviewed, patient examined, no change in status, stable for surgery.  I have reviewed the patient's chart and labs.  Questions were answered to the patient's satisfaction.     Annamaria Helling

## 2022-03-02 NOTE — Transfer of Care (Signed)
Immediate Anesthesia Transfer of Care Note  Patient: Renee [name]  Procedure(s) Performed: COLONOSCOPY ESOPHAGOGASTRODUODENOSCOPY (EGD)  Patient Location: PACU  Anesthesia Type:General  Level of Consciousness: drowsy  Airway & Oxygen Therapy: Patient Spontanous Breathing  Post-op Assessment: Report given to RN and Post -op Vital signs reviewed and stable  Post vital signs: Reviewed and stable  Last Vitals:  Vitals Value Taken Time  BP 142/90 03/02/22 0841  Temp 36.4 C 03/02/22 0840  Pulse 60 03/02/22 0842  Resp 20 03/02/22 0842  SpO2 98 % 03/02/22 0842  Vitals shown include unvalidated device data.  Last Pain:  Vitals:   03/02/22 0840  TempSrc: Temporal  PainSc: Asleep         Complications: No notable events documented.

## 2022-03-02 NOTE — Op Note (Signed)
[facility] Gastroenterology Patient Name: Renee [name] Procedure Date: 03/02/2022 7:22 AM MRN: [id number] Account #: [id number] Date of Birth: 11-01-1955 Admit Type: Outpatient Age: 67 Room: [facility] ENDO ROOM 1 Gender: Female Note Status: Finalized Instrument Name: Peds Colonoscope [id number] Procedure:             Colonoscopy Indications:           Iron deficiency anemia Providers:             Rueben Bash, DO Referring MD:          Ramonita Lab, MD (Referring MD) Medicines:             Monitored Anesthesia Care Complications:         No immediate complications. Estimated blood loss:                         Minimal. Procedure:             Pre-Anesthesia Assessment:                        - Prior to the procedure, a History and Physical was                         performed, and patient medications and allergies were                         reviewed. The patient is competent. The risks and                         benefits of the procedure and the sedation options and                         risks were discussed with the patient. All questions                         were answered and informed consent was obtained.                         Patient identification and proposed procedure were                         verified by the physician, the nurse, the anesthetist                         and the technician in the endoscopy suite. Mental                         Status Examination: alert and oriented. Airway                         Examination: normal oropharyngeal airway and neck                         mobility. Respiratory Examination: clear to                         auscultation. CV Examination: RRR, no murmurs, no S3  or S4. Prophylactic Antibiotics: The patient does not                         require prophylactic antibiotics. Prior                         Anticoagulants: The patient has taken Eliquis                          (apixaban), last dose was 4 days prior to procedure.                         ASA Grade Assessment: III - A patient with severe                         systemic disease. After reviewing the risks and                         benefits, the patient was deemed in satisfactory                         condition to undergo the procedure. The anesthesia                         plan was to use monitored anesthesia care (MAC).                         Immediately prior to administration of medications,                         the patient was re-assessed for adequacy to receive                         sedatives. The heart rate, respiratory rate, oxygen                         saturations, blood pressure, adequacy of pulmonary                         ventilation, and response to care were monitored                         throughout the procedure. The physical status of the                         patient was re-assessed after the procedure.                        After obtaining informed consent, the colonoscope was                         passed under direct vision. Throughout the procedure,                         the patient's blood pressure, pulse, and oxygen                         saturations were monitored continuously. The  Colonoscope was introduced through the anus and                         advanced to the the cecum, identified by appendiceal                         orifice and ileocecal valve. The colonoscopy was                         technically difficult and complex due to a redundant                         colon, a tortuous colon and the patient's body                         habitus. Successful completion of the procedure was                         aided by lavage. The patient tolerated the procedure                         well. The quality of the bowel preparation was                         evaluated using the BBPS [facility] Bowel Preparation                          Scale) with scores of: Right Colon = 2 (minor amount                         of residual staining, small fragments of stool and/or                         opaque liquid, but mucosa seen well), Transverse Colon                         = 3 (entire mucosa seen well with no residual                         staining, small fragments of stool or opaque liquid)                         and Left Colon = 3 (entire mucosa seen well with no                         residual staining, small fragments of stool or opaque                         liquid). The total BBPS score equals 8. The quality of                         the bowel preparation was excellent. Findings:      Skin tags were found on perianal exam.      The digital rectal exam was normal. Pertinent negatives include normal       sphincter tone.      Non-bleeding external and internal hemorrhoids  were found during       retroflexion and during perianal exam. Estimated blood loss: none.      Five sessile polyps were found in the descending colon, transverse colon       and cecum. The polyps were 3 to 7 mm in size. These polyps were removed       with a cold snare. Resection and retrieval were complete. Estimated       blood loss was minimal.      Five sessile polyps were found in the sigmoid colon, transverse colon,       ascending colon and cecum. The polyps were 1 to 2 mm in size. These       polyps were removed with a jumbo cold forceps. Resection and retrieval       were complete. Estimated blood loss was minimal.      Normal mucosa was found in the entire colon. Biopsies for histology were       taken with a cold forceps from the right colon and left colon for       evaluation of microscopic colitis. Estimated blood loss was minimal.      The exam was otherwise without abnormality on direct and retroflexion       views. Impression:            - Perianal skin tags found on perianal exam.                        - Non-bleeding  external and internal hemorrhoids.                        - Five 3 to 7 mm polyps in the descending colon, in                         the transverse colon and in the cecum, removed with a                         cold snare. Resected and retrieved.                        - Five 1 to 2 mm polyps in the sigmoid colon, in the                         transverse colon, in the ascending colon and in the                         cecum, removed with a jumbo cold forceps. Resected and                         retrieved.                        - Normal mucosa in the entire examined colon. Biopsied.                        - The examination was otherwise normal on direct and                         retroflexion views. Recommendation:        - Patient has a contact number available  for                         emergencies. The signs and symptoms of potential                         delayed complications were discussed with the patient.                         Return to normal activities tomorrow. Written                         discharge instructions were provided to the patient.                        - Discharge patient to home.                        - Resume previous diet.                        - Continue present medications.                        - No aspirin, ibuprofen, naproxen, or other                         non-steroidal anti-inflammatory drugs for 5 days after                         polyp removal.                        - Resume Eliquis (apixaban) at prior dose in 2 days.                         Refer to managing physician for further adjustment of                         therapy.                        - Repeat colonoscopy for surveillance based on                         pathology results.                        - Return to GI office as previously scheduled.                        - The findings and recommendations were discussed with                         the patient. Procedure  Code(s):     --- Professional ---                        [phone], Colonoscopy, flexible; with removal of                         tumor(s), polyp(s), or other lesion(s) by snare  technique                        [id number], 59, Colonoscopy, flexible; with biopsy, single                         or multiple Diagnosis Code(s):     --- Professional ---                        D12.4, Benign neoplasm of descending colon                        D12.5, Benign neoplasm of sigmoid colon                        D12.3, Benign neoplasm of transverse colon (hepatic                         flexure or splenic flexure)                        D12.2, Benign neoplasm of ascending colon                        D12.0, Benign neoplasm of cecum                        K64.8, Other hemorrhoids                        K64.4, Residual hemorrhoidal skin tags                        D50.9, Iron deficiency anemia, unspecified CPT copyright 2022 American Medical Association. All rights reserved. The codes documented in this report are preliminary and upon coder review may  be revised to meet current compliance requirements. Attending Participation:      I personally performed the entire procedure. Volney American, DO Annamaria Helling DO, DO 03/02/2022 8:56:50 AM This report has been signed electronically. Number of Addenda: 0 Note Initiated On: 03/02/2022 7:22 AM Scope Withdrawal Time: 0 hours 29 minutes 3 seconds  Total Procedure Duration: 0 hours 37 minutes 9 seconds  Estimated Blood Loss:  Estimated blood loss was minimal.      [facility]

## 2022-03-02 NOTE — Op Note (Signed)
[facility] Gastroenterology Patient Name: Renee [name] Procedure Date: 03/02/2022 7:24 AM MRN: [id number] Account #: [id number] Date of Birth: 03-04-1955 Admit Type: Outpatient Age: 67 Room: [facility] ENDO ROOM 1 Gender: Female Note Status: Finalized Instrument Name: Upper Endoscope [id number] Procedure:             Upper GI endoscopy Indications:           Iron deficiency anemia Providers:             Rueben Bash, DO Referring MD:          Ramonita Lab, MD (Referring MD) Medicines:             Monitored Anesthesia Care Complications:         No immediate complications. Estimated blood loss:                         Minimal. Procedure:             Pre-Anesthesia Assessment:                        - Prior to the procedure, a History and Physical was                         performed, and patient medications and allergies were                         reviewed. The patient is competent. The risks and                         benefits of the procedure and the sedation options and                         risks were discussed with the patient. All questions                         were answered and informed consent was obtained.                         Patient identification and proposed procedure were                         verified by the physician, the nurse, the anesthetist                         and the technician in the endoscopy suite. Mental                         Status Examination: alert and oriented. Airway                         Examination: normal oropharyngeal airway and neck                         mobility. Respiratory Examination: clear to                         auscultation. CV Examination: RRR, no murmurs, no S3  or S4. Prophylactic Antibiotics: The patient does not                         require prophylactic antibiotics. Prior                         Anticoagulants: The patient has taken Eliquis                          (apixaban), last dose was 4 days prior to procedure.                         ASA Grade Assessment: III - A patient with severe                         systemic disease. After reviewing the risks and                         benefits, the patient was deemed in satisfactory                         condition to undergo the procedure. The anesthesia                         plan was to use monitored anesthesia care (MAC).                         Immediately prior to administration of medications,                         the patient was re-assessed for adequacy to receive                         sedatives. The heart rate, respiratory rate, oxygen                         saturations, blood pressure, adequacy of pulmonary                         ventilation, and response to care were monitored                         throughout the procedure. The physical status of the                         patient was re-assessed after the procedure.                        After obtaining informed consent, the endoscope was                         passed under direct vision. Throughout the procedure,                         the patient's blood pressure, pulse, and oxygen                         saturations were monitored continuously. The Endoscope  was introduced through the mouth, and advanced to the                         anastomosis site of gastric bypass. The upper GI                         endoscopy was accomplished without difficulty. The                         patient tolerated the procedure well. Findings:      Surgical anatomy. There are 3 orifices. The native pylorus is present       which feeds into small bowel where the ampulla was visualized. There is       a surgical anastamosis from the gastric mucosa to the small bowel.       Within this, the small bowel has two orifices each feeding to what seems       like continuing lumen. Estimated blood loss: none.      Normal  mucosa was found in the jejunum. Biopsies were taken with a cold       forceps for histology. Estimated blood loss was minimal.      Normal mucosa was found in the duodenal bulb, in the first portion of       the duodenum and in the second portion of the duodenum. Biopsies for       histology were taken with a cold forceps for evaluation of celiac       disease. Estimated blood loss was minimal.      A few 4 to 9 mm sessile polyps with no bleeding and no stigmata of       recent bleeding were found at the anastomosis. Biopsies were taken with       a cold forceps for histology. Estimated blood loss was minimal.      Localized mild inflammation characterized by erythema was found in the       gastric body. Biopsies were taken with a cold forceps for Helicobacter       pylori testing. Estimated blood loss was minimal.      The Z-line was regular. Estimated blood loss: none.      Esophagogastric landmarks were identified: the gastroesophageal junction       was found at 36 cm from the incisors.      Abnormal motility was noted in the esophagus. The cricopharyngeus was       normal. There is spasticity of the esophageal body. The distal       esophagus/lower esophageal sphincter is open. Tertiary peristaltic waves       are noted. Estimated blood loss: none.      The exam of the esophagus was otherwise normal.      Evidence of a previous surgical anastomosis was found in the       anastomosis. This was characterized by healthy appearing mucosa.       Estimated blood loss: none. Impression:            - Normal mucosa was found in the jejunum. Biopsied.                        - Normal mucosa was found in the duodenal bulb, in the  first portion of the duodenum and in the second                         portion of the duodenum. Biopsied.                        - A few gastric polyps. Biopsied.                        - Gastritis. Biopsied.                        - Z-line  regular.                        - Esophagogastric landmarks identified.                        - Abnormal esophageal motility, suspicious for                         presbyesophagus.                        - A previous surgical anastomosis was found,                         characterized by healthy appearing mucosa. Recommendation:        - Patient has a contact number available for                         emergencies. The signs and symptoms of potential                         delayed complications were discussed with the patient.                         Return to normal activities tomorrow. Written                         discharge instructions were provided to the patient.                        - Discharge patient to home.                        - Resume previous diet.                        - Continue present medications.                        - No aspirin, ibuprofen, naproxen, or other                         non-steroidal anti-inflammatory drugs for 5 days.                        - Resume Eliquis (apixaban) at prior dose in 2 days.                         Refer to  managing physician for further adjustment of                         therapy.                        - Await pathology results.                        - Recommend Upper GI study with small bowel follow                         through to delineate anatomy.                        - Return to GI clinic as previously scheduled.                        - The findings and recommendations were discussed with                         the patient. Procedure Code(s):     --- Professional ---                        [phone], Esophagogastroduodenoscopy, flexible,                         transoral; with biopsy, single or multiple Diagnosis Code(s):     --- Professional ---                        K31.7, Polyp of stomach and duodenum                        K29.70, Gastritis, unspecified, without bleeding                        K22.4,  Dyskinesia of esophagus                        Z98.0, Intestinal bypass and anastomosis status                        D50.9, Iron deficiency anemia, unspecified CPT copyright 2022 American Medical Association. All rights reserved. The codes documented in this report are preliminary and upon coder review may  be revised to meet current compliance requirements. Attending Participation:      I personally performed the entire procedure. Volney American, DO Annamaria Helling DO, DO 03/02/2022 8:51:44 AM This report has been signed electronically. Number of Addenda: 0 Note Initiated On: 03/02/2022 7:24 AM Estimated Blood Loss:  Estimated blood loss was minimal.      [facility]

## 2022-03-02 NOTE — Anesthesia Postprocedure Evaluation (Signed)
Anesthesia Post Note  Patient: Renee [name]  Procedure(s) Performed: COLONOSCOPY ESOPHAGOGASTRODUODENOSCOPY (EGD)  Patient location during evaluation: PACU Anesthesia Type: General Level of consciousness: awake and awake and alert Pain management: pain level controlled Vital Signs Assessment: post-procedure vital signs reviewed and stable Respiratory status: spontaneous breathing and nonlabored ventilation Cardiovascular status: stable Anesthetic complications: no  No notable events documented.   Last Vitals:  Vitals:   03/02/22 0900 03/02/22 0910  BP: (!) 149/83 (!) 163/74  Pulse: 60 63  Resp: (!) 23 17  Temp:    SpO2: 99% 98%    Last Pain:  Vitals:   03/02/22 0900  TempSrc:   PainSc: 6                  VAN STAVEREN,Rai Severns
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
## 2022-03-02 NOTE — Progress Notes (Signed)
Patient awakened from her EGD & Colonoscopy with Dr Virgina Jock with complaints of abdominal cramping, rated as an 8.  Abdomen soft, non-distended.  Patient encouraged to stay on her left side and pass gas if needed.  Did not want anything to drink due to being cold, denies nausea.  During the postoperative period, her abdominal cramping/pain decreased to level 5 and her abdomen remained soft, non-distended and she never experienced nausea.  Dr Virgina Jock discussed procedural findings and assessed her abdominal discomfort.  The patient felt ready for discharge and stated that as she dressed and moved around, the pain/cramping was decreasing. She was discharged to home with instructions to notify ENDO Unit or Dr Virgina Jock with increasing pain, fever, nausea/vomiting or other complaints or concerns.  Patient was discharged to home.
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
## 2022-03-04 NOTE — Progress Notes (Signed)
Non-identified Voicemail.  No Message Left.

## 2022-03-05 ENCOUNTER — Encounter: Payer: Self-pay | Admitting: Gastroenterology

## 2022-03-06 LAB — SURGICAL PATHOLOGY

## 2022-03-12 ENCOUNTER — Other Ambulatory Visit: Payer: Self-pay | Admitting: Nurse Practitioner

## 2022-03-12 DIAGNOSIS — R198 Other specified symptoms and signs involving the digestive system and abdomen: Secondary | ICD-10-CM

## 2022-03-19 NOTE — Progress Notes (Signed)
Remote pacemaker transmission.   

## 2022-03-21 ENCOUNTER — Ambulatory Visit
Admission: RE | Admit: 2022-03-21 | Discharge: 2022-03-21 | Disposition: A | Discharge status: Home / Self Care | Payer: Managed Care, Other (non HMO) | Source: Ambulatory Visit | Attending: Nurse Practitioner | Admitting: Nurse Practitioner

## 2022-03-21 DIAGNOSIS — R198 Other specified symptoms and signs involving the digestive system and abdomen: Secondary | ICD-10-CM

## 2022-05-03 ENCOUNTER — Other Ambulatory Visit: Payer: Self-pay | Admitting: Internal Medicine

## 2022-05-03 DIAGNOSIS — N6320 Unspecified lump in the left breast, unspecified quadrant: Secondary | ICD-10-CM

## 2022-05-07 ENCOUNTER — Ambulatory Visit (INDEPENDENT_AMBULATORY_CARE_PROVIDER_SITE_OTHER): Payer: Managed Care, Other (non HMO)

## 2022-05-07 DIAGNOSIS — I495 Sick sinus syndrome: Secondary | ICD-10-CM | POA: Diagnosis not present

## 2022-05-08 LAB — CUP PACEART REMOTE DEVICE CHECK
Battery Remaining Longevity: 70 mo
Battery Remaining Percentage: 58 %
Battery Voltage: 2.99 V
Brady Statistic RA Percent Paced: 98 %
Date Time Interrogation Session: 20240415035103
Implantable Lead Connection Status: 753985
Implantable Lead Connection Status: 753985
Implantable Lead Implant Date: 20181018
Implantable Lead Implant Date: 20181018
Implantable Lead Location: 753859
Implantable Lead Location: 753860
Implantable Pulse Generator Implant Date: 20181018
Lead Channel Impedance Value: 400 Ohm
Lead Channel Pacing Threshold Amplitude: 0.5 V
Lead Channel Pacing Threshold Pulse Width: 0.5 ms
Lead Channel Sensing Intrinsic Amplitude: 5 mV
Lead Channel Setting Pacing Amplitude: 2 V
Pulse Gen Model: 2272
Pulse Gen Serial Number: 8957639

## 2022-05-28 ENCOUNTER — Other Ambulatory Visit: Payer: Self-pay | Admitting: Physician Assistant

## 2022-05-30 ENCOUNTER — Ambulatory Visit
Admission: RE | Admit: 2022-05-30 | Discharge: 2022-05-30 | Disposition: A | Discharge status: Home / Self Care | Payer: Medicare HMO | Source: Ambulatory Visit | Attending: Internal Medicine | Admitting: Internal Medicine

## 2022-05-30 DIAGNOSIS — N6320 Unspecified lump in the left breast, unspecified quadrant: Secondary | ICD-10-CM | POA: Insufficient documentation

## 2022-05-30 DIAGNOSIS — N6322 Unspecified lump in the left breast, upper inner quadrant: Secondary | ICD-10-CM | POA: Diagnosis not present
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
## 2022-06-11 ENCOUNTER — Ambulatory Visit: Payer: Medicare HMO | Attending: Physician Assistant | Admitting: Physician Assistant

## 2022-06-11 ENCOUNTER — Encounter: Payer: Self-pay | Admitting: Physician Assistant

## 2022-06-11 VITALS — BP 130/80 | HR 61 | Ht 70.0 in | Wt 252.6 lb

## 2022-06-11 DIAGNOSIS — I10 Essential (primary) hypertension: Secondary | ICD-10-CM | POA: Diagnosis not present

## 2022-06-11 DIAGNOSIS — Z950 Presence of cardiac pacemaker: Secondary | ICD-10-CM

## 2022-06-11 DIAGNOSIS — I480 Paroxysmal atrial fibrillation: Secondary | ICD-10-CM

## 2022-06-11 DIAGNOSIS — Z79899 Other long term (current) drug therapy: Secondary | ICD-10-CM

## 2022-06-11 DIAGNOSIS — I428 Other cardiomyopathies: Secondary | ICD-10-CM

## 2022-06-11 MED ORDER — METOPROLOL SUCCINATE ER 50 MG PO TB24: 50.0000 mg | ORAL_TABLET | Freq: Two times a day (BID) | ORAL | 3 refills | Status: AC

## 2022-06-11 MED ORDER — LOSARTAN POTASSIUM 50 MG PO TABS: 50.0000 mg | ORAL_TABLET | Freq: Every day | ORAL | 3 refills | Status: DC

## 2022-06-11 NOTE — Progress Notes (Signed)
Remote pacemaker transmission.   

## 2022-06-11 NOTE — Patient Instructions (Signed)
Medication Instructions:   START TAKING:  METOPROLOL SUCCINATE 50 MG TWICE A DAY   START TAKING:  LOSARTAN 50 MG ONCE A DAY    *If you need a refill on your cardiac medications before your next appointment, please call your pharmacy*   Lab Work:  BMET AND CBC TODAY    If you have labs (blood work) drawn today and your tests are completely normal, you will receive your results only by: MyChart Message (if you have MyChart) OR A paper copy in the mail If you have any lab test that is abnormal or we need to change your treatment, we will call you to review the results.   Testing/Procedures: NONE ORDERED  TODAY    Follow-Up: At [facility], you and your health needs are our priority.  As part of our continuing mission to provide you with exceptional heart care, we have created designated Provider Care Teams.  These Care Teams include your primary Cardiologist (physician) and Advanced Practice Providers (APPs -  Physician Assistants and Nurse Practitioners) who all work together to provide you with the care you need, when you need it.  We recommend signing up for the patient portal called "MyChart".  Sign up information is provided on this After Visit Summary.  MyChart is used to connect with patients for Virtual Visits (Telemedicine).  Patients are able to view lab/test results, encounter notes, upcoming appointments, etc.  Non-urgent messages can be sent to your provider as well.   To learn more about what you can do with MyChart, go to [email or web address].    Your next appointment:   6 month(s)  Provider:   You may see Dr. Elberta Fortis     Other Instructions

## 2022-06-12 LAB — CBC
Hematocrit: 39.5 % (ref 34.0–46.6)
Hemoglobin: 12.8 g/dL (ref 11.1–15.9)
MCH: 28.1 pg (ref 26.6–33.0)
MCHC: 32.4 g/dL (ref 31.5–35.7)
MCV: 87 fL (ref 79–97)
Platelets: 240 10*3/uL (ref 150–450)
RBC: 4.55 x10E6/uL (ref 3.77–5.28)
RDW: 13.2 % (ref 11.7–15.4)
WBC: 7.6 10*3/uL (ref 3.4–10.8)

## 2022-06-12 LAB — BASIC METABOLIC PANEL
BUN/Creatinine Ratio: 17 (ref 12–28)
BUN: 13 mg/dL (ref 8–27)
CO2: 26 mmol/L (ref 20–29)
Calcium: 9.8 mg/dL (ref 8.7–10.3)
Chloride: 103 mmol/L (ref 96–106)
Creatinine, Ser: 0.76 mg/dL (ref 0.57–1.00)
Glucose: 103 mg/dL — ABNORMAL HIGH (ref 70–99)
Potassium: 4.9 mmol/L (ref 3.5–5.2)
Sodium: 140 mmol/L (ref 134–144)
eGFR: 87 mL/min/{1.73_m2} (ref >59)

## 2022-07-05 ENCOUNTER — Telehealth: Payer: Self-pay | Admitting: Physician Assistant

## 2022-07-05 NOTE — Telephone Encounter (Signed)
Pt c/o medication issue:  1. Name of Medication: metoprolol succinate (TOPROL-XL) 50 MG 24 hr tablet   2. How are you currently taking this medication (dosage and times per day)?  Take 1 tablet (50 mg total) by mouth in the morning and at bedtime.   3. Are you having a reaction (difficulty breathing--STAT)? no  4. What is your medication issue? Patient states this medication was recently increased. Patient states she fell on Monday, she states her heart has been working a little hard, she feels like she can't catch her breath.  States her HR went up to 100 the other day.  When she fell she couldn't tell if she was just falling or passing out.  She states she is having dizzy spells.  She didn't take the metoprolol last night.
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
## 2022-07-05 NOTE — Telephone Encounter (Signed)
Called the patient, explained Francis Dowse, PA-C  recommendation:   Agree, with visual changes and syncope, she needs ER evaluation   Pt stated her husband has to work tonight, if she does go to the ED would like for her husband to be with her. Pt stated she will try to go to the ED .

## 2022-07-17 ENCOUNTER — Other Ambulatory Visit: Payer: Self-pay | Admitting: Family Medicine

## 2022-07-17 ENCOUNTER — Emergency Department (HOSPITAL_COMMUNITY)
Admission: EM | Admit: 2022-07-17 | Discharge: 2022-07-18 | Disposition: A | Discharge status: Home / Self Care | Payer: Medicare HMO | Attending: Emergency Medicine | Admitting: Emergency Medicine

## 2022-07-17 ENCOUNTER — Emergency Department: Payer: Medicare HMO

## 2022-07-17 ENCOUNTER — Ambulatory Visit
Admission: RE | Admit: 2022-07-17 | Discharge: 2022-07-17 | Disposition: A | Discharge status: Home / Self Care | Payer: Medicare HMO | Source: Ambulatory Visit | Attending: Family Medicine | Admitting: Family Medicine

## 2022-07-17 ENCOUNTER — Other Ambulatory Visit: Payer: Self-pay

## 2022-07-17 ENCOUNTER — Encounter: Payer: Self-pay | Admitting: Emergency Medicine

## 2022-07-17 ENCOUNTER — Emergency Department
Admission: EM | Admit: 2022-07-17 | Discharge: 2022-07-17 | Discharge status: Against Medical Advice | Payer: Medicare HMO | Attending: Emergency Medicine | Admitting: Emergency Medicine

## 2022-07-17 ENCOUNTER — Emergency Department (HOSPITAL_COMMUNITY): Payer: Medicare HMO

## 2022-07-17 DIAGNOSIS — R55 Syncope and collapse: Secondary | ICD-10-CM | POA: Diagnosis not present

## 2022-07-17 DIAGNOSIS — W19XXXA Unspecified fall, initial encounter: Secondary | ICD-10-CM | POA: Insufficient documentation

## 2022-07-17 DIAGNOSIS — H538 Other visual disturbances: Secondary | ICD-10-CM | POA: Insufficient documentation

## 2022-07-17 DIAGNOSIS — Z7982 Long term (current) use of aspirin: Secondary | ICD-10-CM | POA: Diagnosis not present

## 2022-07-17 DIAGNOSIS — H81392 Other peripheral vertigo, left ear: Secondary | ICD-10-CM | POA: Diagnosis not present

## 2022-07-17 DIAGNOSIS — Z79899 Other long term (current) drug therapy: Secondary | ICD-10-CM | POA: Diagnosis not present

## 2022-07-17 DIAGNOSIS — Z950 Presence of cardiac pacemaker: Secondary | ICD-10-CM | POA: Insufficient documentation

## 2022-07-17 DIAGNOSIS — R079 Chest pain, unspecified: Secondary | ICD-10-CM | POA: Insufficient documentation

## 2022-07-17 DIAGNOSIS — I480 Paroxysmal atrial fibrillation: Secondary | ICD-10-CM | POA: Insufficient documentation

## 2022-07-17 DIAGNOSIS — Z7901 Long term (current) use of anticoagulants: Secondary | ICD-10-CM | POA: Diagnosis not present

## 2022-07-17 DIAGNOSIS — R519 Headache, unspecified: Secondary | ICD-10-CM | POA: Insufficient documentation

## 2022-07-17 DIAGNOSIS — Z5321 Procedure and treatment not carried out due to patient leaving prior to being seen by health care provider: Secondary | ICD-10-CM | POA: Insufficient documentation

## 2022-07-17 DIAGNOSIS — R42 Dizziness and giddiness: Secondary | ICD-10-CM | POA: Insufficient documentation

## 2022-07-17 DIAGNOSIS — Z8673 Personal history of transient ischemic attack (TIA), and cerebral infarction without residual deficits: Secondary | ICD-10-CM | POA: Diagnosis not present

## 2022-07-17 DIAGNOSIS — R1031 Right lower quadrant pain: Secondary | ICD-10-CM | POA: Insufficient documentation

## 2022-07-17 LAB — I-STAT CHEM 8, ED
BUN: 16 mg/dL (ref 8–23)
Calcium, Ion: 1.22 mmol/L (ref 1.15–1.40)
Chloride: 102 mmol/L (ref 98–111)
Creatinine, Ser: 1.2 mg/dL — ABNORMAL HIGH (ref 0.44–1.00)
Glucose, Bld: 97 mg/dL (ref 70–99)
HCT: 36 % (ref 36.0–46.0)
Hemoglobin: 12.2 g/dL (ref 12.0–15.0)
Potassium: 3.8 mmol/L (ref 3.5–5.1)
Sodium: 138 mmol/L (ref 135–145)
TCO2: 27 mmol/L (ref 22–32)

## 2022-07-17 LAB — PROTIME-INR
INR: 1.1 (ref 0.8–1.2)
Prothrombin Time: 14.6 seconds (ref 11.4–15.2)

## 2022-07-17 LAB — BASIC METABOLIC PANEL
Anion gap: 11 (ref 5–15)
BUN: 16 mg/dL (ref 8–23)
CO2: 21 mmol/L — ABNORMAL LOW (ref 22–32)
Calcium: 9.5 mg/dL (ref 8.9–10.3)
Chloride: 101 mmol/L (ref 98–111)
Creatinine, Ser: 0.93 mg/dL (ref 0.44–1.00)
GFR, Estimated: >60 mL/min (ref >60)
Glucose, Bld: 128 mg/dL — ABNORMAL HIGH (ref 70–99)
Potassium: 3.2 mmol/L — ABNORMAL LOW (ref 3.5–5.1)
Sodium: 133 mmol/L — ABNORMAL LOW (ref 135–145)

## 2022-07-17 LAB — COMPREHENSIVE METABOLIC PANEL
ALT: 21 U/L (ref 0–44)
AST: 25 U/L (ref 15–41)
Albumin: 4 g/dL (ref 3.5–5.0)
Alkaline Phosphatase: 77 U/L (ref 38–126)
Anion gap: 10 (ref 5–15)
BUN: 15 mg/dL (ref 8–23)
CO2: 26 mmol/L (ref 22–32)
Calcium: 9.7 mg/dL (ref 8.9–10.3)
Chloride: 102 mmol/L (ref 98–111)
Creatinine, Ser: 1.25 mg/dL — ABNORMAL HIGH (ref 0.44–1.00)
GFR, Estimated: 48 mL/min — ABNORMAL LOW (ref >60)
Glucose, Bld: 100 mg/dL — ABNORMAL HIGH (ref 70–99)
Potassium: 3.4 mmol/L — ABNORMAL LOW (ref 3.5–5.1)
Sodium: 138 mmol/L (ref 135–145)
Total Bilirubin: 0.8 mg/dL (ref 0.3–1.2)
Total Protein: 7.8 g/dL (ref 6.5–8.1)

## 2022-07-17 LAB — CBC
HCT: 39 % (ref 36.0–46.0)
Hemoglobin: 13.1 g/dL (ref 12.0–15.0)
MCH: 28.6 pg (ref 26.0–34.0)
MCHC: 33.6 g/dL (ref 30.0–36.0)
MCV: 85.2 fL (ref 80.0–100.0)
Platelets: 237 10*3/uL (ref 150–400)
RBC: 4.58 MIL/uL (ref 3.87–5.11)
RDW: 13.5 % (ref 11.5–15.5)
WBC: 7.1 10*3/uL (ref 4.0–10.5)
nRBC: 0 % (ref 0.0–0.2)

## 2022-07-17 LAB — CBC WITH DIFFERENTIAL/PLATELET
Abs Immature Granulocytes: 0.02 10*3/uL (ref 0.00–0.07)
Basophils Absolute: 0 10*3/uL (ref 0.0–0.1)
Basophils Relative: 0 %
Eosinophils Absolute: 0 10*3/uL (ref 0.0–0.5)
Eosinophils Relative: 0 %
HCT: 39.8 % (ref 36.0–46.0)
Hemoglobin: 12.9 g/dL (ref 12.0–15.0)
Immature Granulocytes: 0 %
Lymphocytes Relative: 19 %
Lymphs Abs: 1.4 10*3/uL (ref 0.7–4.0)
MCH: 27.7 pg (ref 26.0–34.0)
MCHC: 32.4 g/dL (ref 30.0–36.0)
MCV: 85.4 fL (ref 80.0–100.0)
Monocytes Absolute: 0.6 10*3/uL (ref 0.1–1.0)
Monocytes Relative: 8 %
Neutro Abs: 5.3 10*3/uL (ref 1.7–7.7)
Neutrophils Relative %: 73 %
Platelets: 239 10*3/uL (ref 150–400)
RBC: 4.66 MIL/uL (ref 3.87–5.11)
RDW: 13.5 % (ref 11.5–15.5)
WBC: 7.3 10*3/uL (ref 4.0–10.5)
nRBC: 0 % (ref 0.0–0.2)

## 2022-07-17 LAB — TROPONIN I (HIGH SENSITIVITY)
Troponin I (High Sensitivity): 7 ng/L (ref <18)
Troponin I (High Sensitivity): 8 ng/L (ref <18)
Troponin I (High Sensitivity): 9 ng/L (ref <18)

## 2022-07-17 MED ORDER — IOHEXOL 350 MG/ML SOLN
60.0000 mL | Freq: Once | INTRAVENOUS | Status: AC | PRN
  Administered 2022-07-17: 60 mL via INTRAVENOUS

## 2022-07-17 NOTE — ED Notes (Signed)
Pt husband came to the front desk and stated that patient wanted to leave and go to [facility] because all of her doctors are there. RN explained that patient would have to start the visit process all over when she got the [facility]. Pt husband verbalized understanding. Pt was wheeled outside by her husband. Pt was in NAD.

## 2022-07-17 NOTE — Discharge Instructions (Signed)
Contact a health care provider if: You continue to have episodes of near fainting. Get help right away if: You faint. You have any of these symptoms that may indicate trouble with your heart: Fast or irregular heartbeats (palpitations). Unusual pain in your chest, abdomen, or back. Shortness of breath. You have a seizure. You have a severe headache. You are confused. You have vision problems. You have severe weakness or trouble walking. You are bleeding from your mouth or rectum, or have black or tarry stool. These symptoms may represent a serious problem that is an emergency. Do not wait to see if your symptoms will go away. Get medical help right away. Call your local emergency services (911 in the [place] Do not drive yourself to the hospital. 

## 2022-07-17 NOTE — ED Triage Notes (Signed)
Pt with CP that started this afternoon. Near syncopal episode in the car as well.  Radiates to back. Has a pacemaker. She had EKG and labs at [facility] at 1600 today and left prior to getting roomed to come here as she was afraid she was having a heart attack.

## 2022-07-17 NOTE — ED Provider Notes (Signed)
Received signout from previous provider, please see her note for complete H&P.  This is a 67 year old female initially complaining of near syncope.  She has sick sinus syndrome status post pacemaker.  She is currently on Eliquis.  Her dizziness is intermittent.  -Labs ordered, independently viewed and interpreted by me.  Labs remarkable for normal delta trop, doubt ACS.  Electrolytes panel are reassuring, mild AKI.   -The patient was maintained on a cardiac monitor.  I personally viewed and interpreted the cardiac monitored which showed an underlying rhythm of: sinus bradycardia  -Imaging independently viewed and interpreted by me and I agree with radiologist's interpretation.  Result remarkable for abd/pelvis CT without acute changes -This patient presents to the ED for concern of near syncope, this involves an extensive number of treatment options, and is a complaint that carries with it a high risk of complications and morbidity.  The differential diagnosis includes cardiac arrhythmia, dehydration, hypovolemia, hypoglycemia, electrolytes derangement, stroke, MI -Co morbidities that complicate the patient evaluation includes paroxysmal afib, cva, CAD -Treatment includes none -Reevaluation of the patient after these medicines showed that the patient improved -PCP office notes or outside notes reviewed -Discussion with specialist pace maker interrogation showing no concerning event -Escalation to admission/observation considered: patients feels much better, is comfortable with discharge, and will follow up with PCP -Prescription medication considered, patient comfortable with home medication -Social Determinant of Health considered which includes tobacco use  BP 115/77   Pulse (!) 59   Temp 98.2 F (36.8 C) (Oral)   Resp (!) 21   LMP  (LMP Unknown)   SpO2 98%   Results for orders placed or performed during the hospital encounter of 07/17/22  CBC WITH DIFFERENTIAL  Result Value Ref Range    WBC 7.3 4.0 - 10.5 K/uL   RBC 4.66 3.87 - 5.11 MIL/uL   Hemoglobin 12.9 12.0 - 15.0 g/dL   HCT 57.8 46.9 - 62.9 %   MCV 85.4 80.0 - 100.0 fL   MCH 27.7 26.0 - 34.0 pg   MCHC 32.4 30.0 - 36.0 g/dL   RDW 52.8 41.3 - 24.4 %   Platelets 239 150 - 400 K/uL   nRBC 0.0 0.0 - 0.2 %   Neutrophils Relative % 73 %   Neutro Abs 5.3 1.7 - 7.7 K/uL   Lymphocytes Relative 19 %   Lymphs Abs 1.4 0.7 - 4.0 K/uL   Monocytes Relative 8 %   Monocytes Absolute 0.6 0.1 - 1.0 K/uL   Eosinophils Relative 0 %   Eosinophils Absolute 0.0 0.0 - 0.5 K/uL   Basophils Relative 0 %   Basophils Absolute 0.0 0.0 - 0.1 K/uL   Immature Granulocytes 0 %   Abs Immature Granulocytes 0.02 0.00 - 0.07 K/uL  Comprehensive metabolic panel  Result Value Ref Range   Sodium 138 135 - 145 mmol/L   Potassium 3.4 (L) 3.5 - 5.1 mmol/L   Chloride 102 98 - 111 mmol/L   CO2 26 22 - 32 mmol/L   Glucose, Bld 100 (H) 70 - 99 mg/dL   BUN 15 8 - 23 mg/dL   Creatinine, Ser 0.10 (H) 0.44 - 1.00 mg/dL   Calcium 9.7 8.9 - 27.2 mg/dL   Total Protein 7.8 6.5 - 8.1 g/dL   Albumin 4.0 3.5 - 5.0 g/dL   AST 25 15 - 41 U/L   ALT 21 0 - 44 U/L   Alkaline Phosphatase 77 38 - 126 U/L   Total Bilirubin 0.8 0.3 -  1.2 mg/dL   GFR, Estimated 48 (L) >60 mL/min   Anion gap 10 5 - 15  I-Stat Chem 8, ED  Result Value Ref Range   Sodium 138 135 - 145 mmol/L   Potassium 3.8 3.5 - 5.1 mmol/L   Chloride 102 98 - 111 mmol/L   BUN 16 8 - 23 mg/dL   Creatinine, Ser 6.96 (H) 0.44 - 1.00 mg/dL   Glucose, Bld 97 70 - 99 mg/dL   Calcium, Ion 2.95 2.84 - 1.40 mmol/L   TCO2 27 22 - 32 mmol/L   Hemoglobin 12.2 12.0 - 15.0 g/dL   HCT 13.2 44.0 - 10.2 %  Troponin I (High Sensitivity)  Result Value Ref Range   Troponin I (High Sensitivity) 8 <18 ng/L  Troponin I (High Sensitivity)  Result Value Ref Range   Troponin I (High Sensitivity) 9 <18 ng/L   CT ABDOMEN PELVIS W CONTRAST  Result Date: 07/17/2022 CLINICAL DATA:  Right lower quadrant pain. EXAM:  CT ABDOMEN AND PELVIS WITH CONTRAST TECHNIQUE: Multidetector CT imaging of the abdomen and pelvis was performed using the standard protocol following bolus administration of intravenous contrast. RADIATION DOSE REDUCTION: This exam was performed according to the departmental dose-optimization program which includes automated exposure control, adjustment of the mA and/or kV according to patient size and/or use of iterative reconstruction technique. CONTRAST:  60mL OMNIPAQUE IOHEXOL 350 MG/ML SOLN COMPARISON:  November 08, 2021 FINDINGS: Lower chest: No acute abnormality. Hepatobiliary: A 12 mm diameter cyst is seen within the left lobe of the liver. Status post cholecystectomy. No biliary dilatation. Pancreas: Unremarkable. No pancreatic ductal dilatation or surrounding inflammatory changes. Spleen: Normal in size without focal abnormality. Adrenals/Urinary Tract: Adrenal glands are unremarkable. Kidneys are normal in size, without renal calculi or hydronephrosis. Small simple cysts are seen within the left kidney. Bladder is unremarkable. Stomach/Bowel: Stomach is within normal limits. The appendix is surgically absent. No evidence of bowel wall thickening, distention, or inflammatory changes. Vascular/Lymphatic: Aortic atherosclerosis. No enlarged abdominal or pelvic lymph nodes. Reproductive: Status post hysterectomy. No adnexal masses. Other: No abdominal wall hernia or abnormality. No abdominopelvic ascites. Musculoskeletal: No acute or significant osseous findings. IMPRESSION: 1. Evidence of prior cholecystectomy, appendectomy and hysterectomy. 2. No acute or active process within the abdomen or pelvis. 3. Aortic atherosclerosis. Aortic Atherosclerosis (ICD10-I70.0). Electronically Signed   By: Aram Candela M.D.   On: 07/17/2022 22:16   DG Chest 2 View  Result Date: 07/17/2022 CLINICAL DATA:  Chest pain EXAM: CHEST - 2 VIEW COMPARISON:  Chest x-ray dated September 15, 2020 FINDINGS: The heart size and  mediastinal contours are within normal limits. Left chest wall dual lead pacer with unchanged lead position. Both lungs are clear. The visualized skeletal structures are unremarkable. IMPRESSION: No active cardiopulmonary disease. Electronically Signed   By: Allegra Lai M.D.   On: 07/17/2022 17:01   CT HEAD WO CONTRAST ( )  Result Date: 07/17/2022 CLINICAL DATA:  Dizziness.  Blurred vision. EXAM: CT HEAD WITHOUT CONTRAST TECHNIQUE: Contiguous axial images were obtained from the base of the skull through the vertex without intravenous contrast. RADIATION DOSE REDUCTION: This exam was performed according to the departmental dose-optimization program which includes automated exposure control, adjustment of the mA and/or kV according to patient size and/or use of iterative reconstruction technique. COMPARISON:  CT head April 06, 2015. FINDINGS: Brain: No evidence of acute infarction, hemorrhage, hydrocephalus, extra-axial collection or mass lesion/mass effect. Vascular: No hyperdense vessel. Skull: Normal. Negative for fracture or focal lesion. Sinuses/Orbits:  No acute finding. IMPRESSION: No evidence of acute intracranial abnormality. Electronically Signed   By: Feliberto Harts M.D.   On: 07/17/2022 14:41       Fayrene Helper, PA-C 07/18/22 0222    Nira Conn, MD 07/18/22 0800

## 2022-07-17 NOTE — ED Triage Notes (Signed)
Patient to ED via POV for centralized chest pain that started this afternoon. Patient states pain radiates into back and SOB. Hx of afib, MI, and stroke. Had recent changes in meds- metoprolol and losartan. Has pacemaker.

## 2022-07-17 NOTE — ED Provider Notes (Incomplete)
Delta trop and Glass blower/designer.

## 2022-07-17 NOTE — ED Provider Notes (Signed)
[name] EMERGENCY DEPARTMENT AT [facility] Provider Note   CSN: [id number] Arrival date & time: 07/17/22  1809     History {Add pertinent medical, surgical, social history, OB history to HPI:1} Chief Complaint  Patient presents with  . Chest Pain    Renee [name] is a 67 y.o. female.  Who presents emergency department for chief complaint of near syncope.  She has an extensive past medical history that includes sick sinus syndrome, MI, stroke, paroxysmal atrial fibrillation.  She is status post pacemaker placement.  She has an extensive family history of cardiac disease.  She states that her brother had an aortic dissection and she had a sister who died of massive MI at the age of 41.  Has a history of smoking.  Report that she has been having multiple issues going on lately but she was in her car today after being seen earlier by her primary care doctor for pain in her right lower quadrant and dizziness.  He had a chest x-ray and a head CT done earlier today both of which were negative.  She reports that while she was sitting in her car she suddenly had severe pressure in her chest and then felt "like the breath and life or just going right out of my chest."  She began having tunnel vision and noted that her head was bobbing up and down and felt like she was going to pass out.  This lasted for a minute or so and then began to improve.  She continues to feel lightheaded.  She denies melena or hematochezia.  She have a negative call back from her doctor from earlier today who told her to call 911 and come to the emergency department.  She also reports that on the 10th of this month she was escorting her brother to [facility] for a medical appointment and had loss of consciousness while waiting at the valet.  She states that she had no prodrome and that she was standing at 1 minute and the next minute was lying on the concrete surrounded by bunch of individuals.  She has been having episodes  of intermittent paroxysmal atrial fibrillation which she is aware of.  She continues to be compliant with her Eliquis.  She has had episodes of intermittent dizziness worse when she turns her head to the left and feeling like she is going to fall to the left but this comes and goes.  She has also been having pain in her right lower quadrant for the past several days.  She has not had any imaging.   Chest Pain      Home Medications Prior to Admission medications   Medication Sig Start Date End Date Taking? Authorizing Provider  acetaminophen (TYLENOL) 500 MG tablet Take 1,000 mg by mouth every 6 (six) hours as needed (pain).    [provider]  aspirin-acetaminophen-caffeine (EXCEDRIN MIGRAINE) [phone] MG tablet Take 1 tablet by mouth every 6 (six) hours as needed for headache.    [provider]  Aspirin-Acetaminophen-Caffeine (GOODYS EXTRA STRENGTH PO) Take 1 packet by mouth daily as needed (headache).    [provider]  atorvastatin (LIPITOR) 40 MG tablet Take 1 tablet (40 mg total) by mouth daily. Please keep scheduled appointment for future refills. Thank you. 05/29/22   Sheilah Pigeon, PA-C  Chlorphen-Phenyleph-ASA (ALKA-SELTZER PLUS COLD) 2-7.8-325 MG TBEF Take 1 Dose by mouth daily as needed (cold symptoms).    [provider]  chlorthalidone (HYGROTON) 25  MG tablet Take 1 tablet (25 mg total) by mouth every other day. 06/21/21 07/31/23  Sheilah Pigeon, PA-C  ELIQUIS 5 MG TABS tablet TAKE 1 TABLET(5 MG) BY MOUTH TWICE DAILY 08/01/20   Allred, Fayrene Fearing, MD  ferrous sulfate 325 (65 FE) MG tablet Take 325 mg by mouth at bedtime.    [provider]  hydrOXYzine (VISTARIL) 50 MG capsule Take 50 mg by mouth 2 (two) times daily as needed for anxiety. 09/01/19   [provider]  levothyroxine (SYNTHROID) 100 MCG tablet Take 100 mcg by mouth daily. 12/08/20   [provider]  Loratadine 10 MG CAPS Take 10 mg by mouth daily.     [provider]  losartan (COZAAR) 50 MG tablet Take 1 tablet (50 mg total) by mouth daily. 06/11/22   Sheilah Pigeon, PA-C  Magnesium 250 MG TABS Take 1 tablet (250 mg total) by mouth 2 (two) times daily. 06/27/16   Newman Nip, NP  metoprolol succinate (TOPROL-XL) 50 MG 24 hr tablet Take 1 tablet (50 mg total) by mouth in the morning and at bedtime. 06/11/22 04/05/25  Sheilah Pigeon, PA-C  Multiple Vitamins-Minerals (MULTIVITAMIN WITH MINERALS) tablet Take 1 tablet by mouth at bedtime.    [provider]  omeprazole (PRILOSEC) 40 MG capsule Take 40 mg by mouth 2 (two) times daily.  08/17/13   [provider]  promethazine (PHENERGAN) 12.5 MG tablet Take 1 tablet by mouth every 6 (six) hours as needed for nausea or vomiting.  01/30/17   [provider]  sertraline (ZOLOFT) 100 MG tablet Take 150 mg by mouth daily. 02/12/20   [provider]  spironolactone (ALDACTONE) 25 MG tablet TAKE 1 TABLET BY MOUTH ONCE A DAY 02/09/22   Sheilah Pigeon, PA-C  traZODone (DESYREL) 100 MG tablet Take 100 mg by mouth at bedtime. 12/08/20   [provider]  valACYclovir (VALTREX) 500 MG tablet Take 500 mg by mouth 2 (two) times daily. 10/05/20   [provider]      Allergies    Nsaids; Tolmetin; 2,4-d dimethylamine; Ace inhibitors; Other; Telmisartan; Codeine; and Prednisone    Review of Systems   Review of Systems  Cardiovascular:  Positive for chest pain.    Physical Exam Updated Vital Signs BP (!) 140/52 (BP Location: Left Arm)   Pulse 60   Temp 98.4 F (36.9 C) (Oral)   Resp 20   LMP  (LMP Unknown)   SpO2 96%  Physical Exam Vitals and nursing note reviewed.  Constitutional:      General: She is not in acute distress.    Appearance: She is well-developed. She is not diaphoretic.  HENT:     Head: Normocephalic and atraumatic.     Right Ear: External ear normal.     Left Ear: External ear normal.     Nose: Nose normal.      Mouth/Throat:     Mouth: Mucous membranes are moist.  Eyes:     General: No scleral icterus.    Conjunctiva/sclera: Conjunctivae normal.  Cardiovascular:     Rate and Rhythm: Normal rate and regular rhythm.     Heart sounds: Normal heart sounds. No murmur heard.    No friction rub. No gallop.  Pulmonary:     Effort: Pulmonary effort is normal. No respiratory distress.     Breath sounds: Normal breath sounds.  Abdominal:     General: Bowel sounds are normal. There is no distension.  Palpations: Abdomen is soft. There is no mass.     Tenderness: There is abdominal tenderness in the right lower quadrant. There is no guarding.  Musculoskeletal:     Cervical back: Normal range of motion.  Skin:    General: Skin is warm and dry.  Neurological:     Mental Status: She is alert and oriented to person, place, and time.  Psychiatric:        Behavior: Behavior normal.    ED Results / Procedures / Treatments   Labs (all labs ordered are listed, but only abnormal results are displayed) Labs Reviewed - No data to display  EKG None  Radiology DG Chest 2 View  Result Date: 07/17/2022 CLINICAL DATA:  Chest pain EXAM: CHEST - 2 VIEW COMPARISON:  Chest x-ray dated September 15, 2020 FINDINGS: The heart size and mediastinal contours are within normal limits. Left chest wall dual lead pacer with unchanged lead position. Both lungs are clear. The visualized skeletal structures are unremarkable. IMPRESSION: No active cardiopulmonary disease. Electronically Signed   By: Allegra Lai M.D.   On: 07/17/2022 17:01   CT HEAD WO CONTRAST ( )  Result Date: 07/17/2022 CLINICAL DATA:  Dizziness.  Blurred vision. EXAM: CT HEAD WITHOUT CONTRAST TECHNIQUE: Contiguous axial images were obtained from the base of the skull through the vertex without intravenous contrast. RADIATION DOSE REDUCTION: This exam was performed according to the departmental dose-optimization program which includes automated exposure  control, adjustment of the mA and/or kV according to patient size and/or use of iterative reconstruction technique. COMPARISON:  CT head April 06, 2015. FINDINGS: Brain: No evidence of acute infarction, hemorrhage, hydrocephalus, extra-axial collection or mass lesion/mass effect. Vascular: No hyperdense vessel. Skull: Normal. Negative for fracture or focal lesion. Sinuses/Orbits: No acute finding. IMPRESSION: No evidence of acute intracranial abnormality. Electronically Signed   By: Feliberto Harts M.D.   On: 07/17/2022 14:41    Procedures Procedures  {Document cardiac monitor, telemetry assessment procedure when appropriate:1}  Medications Ordered in ED Medications - No data to display  ED Course/ Medical Decision Making/ A&P   {   Click here for ABCD2, HEART and other calculatorsREFRESH Note before signing :1}                          Medical Decision Making Amount and/or Complexity of Data Reviewed Labs: ordered. Radiology: ordered. ECG/medicine tests: ordered.   ***  {Document critical care time when appropriate:1} {Document review of labs and clinical decision tools ie heart score, Chads2Vasc2 etc:1}  {Document your independent review of radiology images, and any outside records:1} {Document your discussion with family members, caretakers, and with consultants:1} {Document social determinants of health affecting pt's care:1} {Document your decision making why or why not admission, treatments were needed:1} Final Clinical Impression(s) / ED Diagnoses Final diagnoses:  None    Rx / DC Orders ED Discharge Orders     None

## 2022-08-06 ENCOUNTER — Ambulatory Visit (INDEPENDENT_AMBULATORY_CARE_PROVIDER_SITE_OTHER): Payer: Medicare HMO

## 2022-08-06 DIAGNOSIS — I495 Sick sinus syndrome: Secondary | ICD-10-CM | POA: Diagnosis not present

## 2022-08-06 LAB — CUP PACEART REMOTE DEVICE CHECK
Battery Remaining Longevity: 67 mo
Battery Remaining Percentage: 56 %
Battery Voltage: 2.99 V
Brady Statistic RA Percent Paced: 98 %
Date Time Interrogation Session: 20240715043208
Implantable Lead Connection Status: 753985
Implantable Lead Connection Status: 753985
Implantable Lead Implant Date: 20181018
Implantable Lead Implant Date: 20181018
Implantable Lead Location: 753859
Implantable Lead Location: 753860
Implantable Pulse Generator Implant Date: 20181018
Lead Channel Impedance Value: 430 Ohm
Lead Channel Pacing Threshold Amplitude: 0.5 V
Lead Channel Pacing Threshold Pulse Width: 0.5 ms
Lead Channel Sensing Intrinsic Amplitude: 5 mV
Lead Channel Setting Pacing Amplitude: 2 V
Pulse Gen Model: 2272
Pulse Gen Serial Number: 8957639

## 2022-08-20 NOTE — Progress Notes (Signed)
Remote pacemaker transmission.   

## 2022-08-27 ENCOUNTER — Other Ambulatory Visit: Payer: Self-pay | Admitting: Physician Assistant

## 2022-11-05 ENCOUNTER — Ambulatory Visit (INDEPENDENT_AMBULATORY_CARE_PROVIDER_SITE_OTHER): Payer: Medicare HMO

## 2022-11-05 DIAGNOSIS — I495 Sick sinus syndrome: Secondary | ICD-10-CM | POA: Diagnosis not present

## 2022-11-06 LAB — CUP PACEART REMOTE DEVICE CHECK
Battery Remaining Longevity: 65 mo
Battery Remaining Percentage: 54 %
Battery Voltage: 2.99 V
Brady Statistic RA Percent Paced: 98 %
Date Time Interrogation Session: 20241015015018
Implantable Lead Connection Status: 753985
Implantable Lead Connection Status: 753985
Implantable Lead Implant Date: 20181018
Implantable Lead Implant Date: 20181018
Implantable Lead Location: 753859
Implantable Lead Location: 753860
Implantable Pulse Generator Implant Date: 20181018
Lead Channel Impedance Value: 410 Ohm
Lead Channel Pacing Threshold Amplitude: 0.5 V
Lead Channel Pacing Threshold Pulse Width: 0.5 ms
Lead Channel Sensing Intrinsic Amplitude: 5 mV
Lead Channel Setting Pacing Amplitude: 2 V
Pulse Gen Model: 2272
Pulse Gen Serial Number: 8957639

## 2022-11-20 NOTE — Progress Notes (Signed)
Remote pacemaker transmission.   

## 2022-12-10 ENCOUNTER — Encounter: Payer: Self-pay | Admitting: Cardiology

## 2022-12-10 ENCOUNTER — Ambulatory Visit: Payer: Medicare HMO | Attending: Cardiology | Admitting: Cardiology

## 2022-12-10 VITALS — BP 136/76 | HR 60 | Ht 70.0 in | Wt 253.8 lb

## 2022-12-10 DIAGNOSIS — I480 Paroxysmal atrial fibrillation: Secondary | ICD-10-CM | POA: Diagnosis not present

## 2022-12-10 DIAGNOSIS — D6869 Other thrombophilia: Secondary | ICD-10-CM | POA: Diagnosis not present

## 2022-12-10 DIAGNOSIS — I428 Other cardiomyopathies: Secondary | ICD-10-CM

## 2022-12-10 DIAGNOSIS — I495 Sick sinus syndrome: Secondary | ICD-10-CM | POA: Diagnosis not present

## 2022-12-10 LAB — CUP PACEART INCLINIC DEVICE CHECK
Battery Remaining Longevity: 62 mo
Battery Voltage: 2.99 V
Brady Statistic RA Percent Paced: 100 %
Brady Statistic RV Percent Paced: 0 %
Date Time Interrogation Session: 20241118121516
Implantable Lead Connection Status: 753985
Implantable Lead Connection Status: 753985
Implantable Lead Implant Date: 20181018
Implantable Lead Implant Date: 20181018
Implantable Lead Location: 753859
Implantable Lead Location: 753860
Implantable Pulse Generator Implant Date: 20181018
Lead Channel Pacing Threshold Amplitude: 0.625 V
Lead Channel Pacing Threshold Pulse Width: 0.5 ms
Lead Channel Sensing Intrinsic Amplitude: 12 mV
Lead Channel Sensing Intrinsic Amplitude: 5 mV
Lead Channel Setting Pacing Amplitude: 2 V
Pulse Gen Model: 2272
Pulse Gen Serial Number: 8957639

## 2022-12-10 NOTE — Patient Instructions (Signed)
 Medication Instructions:  Your physician recommends that you continue on your current medications as directed. Please refer to the Current Medication list given to you today.  *If you need a refill on your cardiac medications before your next appointment, please call your pharmacy*   Lab Work: None ordered   Testing/Procedures: None ordered   Follow-Up: At [facility], you and your health needs are our priority.  As part of our continuing mission to provide you with exceptional heart care, we have created designated Provider Care Teams.  These Care Teams include your primary Cardiologist (physician) and Advanced Practice Providers (APPs -  Physician Assistants and Nurse Practitioners) who all work together to provide you with the care you need, when you need it.  Your next appointment:   1 year(s)  The format for your next appointment:   In Person  Provider:   You will see one of the following Advanced Practice Providers on your designated Care Team:   Francis Dowse, [place] "Mardelle Matte" [place], [place] Canary Brim, NP   Thank you for choosing [facility]!!   Dory Horn, RN [phone]

## 2022-12-10 NOTE — Progress Notes (Signed)
  Electrophysiology Office Note:   Date:  12/10/2022  ID:  Renee [name], DOB 01-Aug-1955, MRN [id number]  Primary Cardiologist: None Electrophysiologist: Hillis Range, MD (Inactive)      History of Present Illness:   Renee [name] is a 67 y.o. female with h/o hypertension, hyperlipidemia, TIA, atrial fibrillation, symptomatic bradycardia seen today for routine electrophysiology followup.   Since last being seen in our clinic the patient reports doing overall well.  She has no chest pain or shortness of breath.  She is able to do all her daily activities without restriction..  she denies chest pain, palpitations, dyspnea, PND, orthopnea, nausea, vomiting, dizziness, syncope, edema, weight gain, or early satiety.   Review of systems complete and found to be negative unless listed in HPI.      EP Information / Studies Reviewed:    EKG is ordered today. Personal review as below.  EKG Interpretation Date/Time:  Monday December 10 2022 11:39:33 EST Ventricular Rate:  60 PR Interval:  226 QRS Duration:  72 QT Interval:  410 QTC Calculation: 410 R Axis:   29  Text Interpretation: Atrial-paced rhythm with prolonged AV conduction When compared with ECG of 17-Jul-2022 19:26, No significant change since last tracing Confirmed by Hayly Litsey ([id number]) on 12/10/2022 11:46:46 AM   PPM Interrogation-  reviewed in detail today,  See PACEART report.  Device History: Abbott Dual Chamber PPM implanted 11/08/2016 for Symptomatic bradycardia  Risk Assessment/Calculations:    CHA2DS2-VASc Score = 6   This indicates a 9.7% annual risk of stroke. The patient's score is based upon: CHF History: 0 HTN History: 1 Diabetes History: 0 Stroke History: 2 Vascular Disease History: 1 Age Score: 1 Gender Score: 1             Physical Exam:   VS:  BP 136/76 (BP Location: Left Arm, Patient Position: Sitting, Cuff Size: Large)   Pulse 60   Ht 5\' 10"  (1.778 m)   Wt 253 lb 12.8 oz (115.1 kg)    LMP  (LMP Unknown)   SpO2 98%   BMI 36.42 kg/m    Wt Readings from Last 3 Encounters:  12/10/22 253 lb 12.8 oz (115.1 kg)  06/11/22 252 lb 9.6 oz (114.6 kg)  03/02/22 251 lb 8.4 oz (114.1 kg)     GEN: Well nourished, well developed in no acute distress NECK: No JVD; No carotid bruits CARDIAC: Regular rate and rhythm, no murmurs, rubs, gallops RESPIRATORY:  Clear to auscultation without rales, wheezing or rhonchi  ABDOMEN: Soft, non-tender, non-distended EXTREMITIES:  No edema; No deformity   ASSESSMENT AND PLAN:    Symptomatic bradycardia s/p Abbott PPM  Normal PPM function See Pace Art report No changes today Ice pacing AAI due to high burden of PMT.  2.  Paroxysmal atrial fibrillation: Currently on metoprolol.  Minimal episodes.  She is feeling well.  Aitanna Haubner continue with current management.  3.  Secondary hypercoagulable state: Currently on Eliquis for atrial fibrillation  4.  Stress-induced cardiomyopathy: Ejection fraction has recovered.  Disposition:   Follow up with EP APP in 12 months  Signed, Jafar Poffenberger Jorja Loa, MD

## 2023-02-04 ENCOUNTER — Ambulatory Visit (INDEPENDENT_AMBULATORY_CARE_PROVIDER_SITE_OTHER): Payer: Medicare HMO

## 2023-02-04 DIAGNOSIS — I495 Sick sinus syndrome: Secondary | ICD-10-CM

## 2023-02-04 LAB — CUP PACEART REMOTE DEVICE CHECK
Battery Remaining Longevity: 62 mo
Battery Remaining Percentage: 52 %
Battery Voltage: 2.99 V
Brady Statistic RA Percent Paced: 97 %
Date Time Interrogation Session: 20250113045959
Implantable Lead Connection Status: 753985
Implantable Lead Connection Status: 753985
Implantable Lead Implant Date: 20181018
Implantable Lead Implant Date: 20181018
Implantable Lead Location: 753859
Implantable Lead Location: 753860
Implantable Pulse Generator Implant Date: 20181018
Lead Channel Impedance Value: 410 Ohm
Lead Channel Pacing Threshold Amplitude: 0.5 V
Lead Channel Pacing Threshold Pulse Width: 0.5 ms
Lead Channel Sensing Intrinsic Amplitude: 4.8 mV
Lead Channel Setting Pacing Amplitude: 2 V
Pulse Gen Model: 2272
Pulse Gen Serial Number: 8957639

## 2023-03-08 ENCOUNTER — Other Ambulatory Visit: Payer: Self-pay | Admitting: Internal Medicine

## 2023-03-08 DIAGNOSIS — Z780 Asymptomatic menopausal state: Secondary | ICD-10-CM

## 2023-03-18 NOTE — Progress Notes (Signed)
 Remote pacemaker transmission.

## 2023-03-28 ENCOUNTER — Ambulatory Visit
Admission: RE | Admit: 2023-03-28 | Discharge: 2023-03-28 | Disposition: A | Discharge status: Home / Self Care | Source: Ambulatory Visit | Attending: Internal Medicine | Admitting: Internal Medicine

## 2023-03-28 DIAGNOSIS — Z780 Asymptomatic menopausal state: Secondary | ICD-10-CM | POA: Diagnosis present

## 2023-04-30 ENCOUNTER — Other Ambulatory Visit: Payer: Medicare HMO

## 2023-05-06 ENCOUNTER — Ambulatory Visit: Payer: Medicare HMO | Attending: Cardiology

## 2023-05-06 DIAGNOSIS — I495 Sick sinus syndrome: Secondary | ICD-10-CM | POA: Diagnosis not present

## 2023-05-06 DIAGNOSIS — I480 Paroxysmal atrial fibrillation: Secondary | ICD-10-CM

## 2023-05-06 LAB — CUP PACEART REMOTE DEVICE CHECK
Battery Remaining Longevity: 60 mo
Battery Remaining Percentage: 50 %
Battery Voltage: 2.99 V
Brady Statistic RA Percent Paced: 98 %
Date Time Interrogation Session: 20250414024039
Implantable Lead Connection Status: 753985
Implantable Lead Connection Status: 753985
Implantable Lead Implant Date: 20181018
Implantable Lead Implant Date: 20181018
Implantable Lead Location: 753859
Implantable Lead Location: 753860
Implantable Pulse Generator Implant Date: 20181018
Lead Channel Impedance Value: 410 Ohm
Lead Channel Pacing Threshold Amplitude: 0.5 V
Lead Channel Pacing Threshold Pulse Width: 0.5 ms
Lead Channel Sensing Intrinsic Amplitude: 5 mV
Lead Channel Setting Pacing Amplitude: 2 V
Pulse Gen Model: 2272
Pulse Gen Serial Number: 8957639

## 2023-05-14 ENCOUNTER — Other Ambulatory Visit: Payer: Self-pay | Admitting: Obstetrics and Gynecology

## 2023-05-14 DIAGNOSIS — Z1231 Encounter for screening mammogram for malignant neoplasm of breast: Secondary | ICD-10-CM

## 2023-05-28 ENCOUNTER — Other Ambulatory Visit: Payer: Self-pay | Admitting: Physician Assistant

## 2023-06-04 ENCOUNTER — Ambulatory Visit
Admission: RE | Admit: 2023-06-04 | Discharge: 2023-06-04 | Disposition: A | Discharge status: Home / Self Care | Source: Ambulatory Visit | Attending: Obstetrics and Gynecology | Admitting: Obstetrics and Gynecology

## 2023-06-04 DIAGNOSIS — Z1231 Encounter for screening mammogram for malignant neoplasm of breast: Secondary | ICD-10-CM | POA: Insufficient documentation

## 2023-06-20 NOTE — Progress Notes (Signed)
 Remote pacemaker transmission.
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
## 2023-06-21 ENCOUNTER — Ambulatory Visit: Attending: Physician Assistant | Admitting: Physician Assistant

## 2023-06-21 ENCOUNTER — Encounter: Payer: Self-pay | Admitting: Physician Assistant

## 2023-06-21 VITALS — BP 112/76 | HR 65 | Ht 70.0 in | Wt 252.0 lb

## 2023-06-21 DIAGNOSIS — R5383 Other fatigue: Secondary | ICD-10-CM

## 2023-06-21 DIAGNOSIS — Z950 Presence of cardiac pacemaker: Secondary | ICD-10-CM

## 2023-06-21 DIAGNOSIS — Z79899 Other long term (current) drug therapy: Secondary | ICD-10-CM

## 2023-06-21 DIAGNOSIS — I420 Dilated cardiomyopathy: Secondary | ICD-10-CM

## 2023-06-21 DIAGNOSIS — I480 Paroxysmal atrial fibrillation: Secondary | ICD-10-CM | POA: Diagnosis not present

## 2023-06-21 DIAGNOSIS — R0789 Other chest pain: Secondary | ICD-10-CM | POA: Diagnosis not present

## 2023-06-21 DIAGNOSIS — D6869 Other thrombophilia: Secondary | ICD-10-CM

## 2023-06-21 LAB — CUP PACEART INCLINIC DEVICE CHECK
Battery Remaining Longevity: 55 mo
Battery Voltage: 2.98 V
Brady Statistic RA Percent Paced: 98 %
Brady Statistic RV Percent Paced: 0 %
Date Time Interrogation Session: 20250530172842
Implantable Lead Connection Status: 753985
Implantable Lead Connection Status: 753985
Implantable Lead Implant Date: 20181018
Implantable Lead Implant Date: 20181018
Implantable Lead Location: 753859
Implantable Lead Location: 753860
Implantable Pulse Generator Implant Date: 20181018
Lead Channel Impedance Value: 412.5 Ohm
Lead Channel Impedance Value: 462.5 Ohm
Lead Channel Pacing Threshold Amplitude: 0.5 V
Lead Channel Pacing Threshold Amplitude: 0.5 V
Lead Channel Pacing Threshold Amplitude: 0.625 V
Lead Channel Pacing Threshold Pulse Width: 0.5 ms
Lead Channel Pacing Threshold Pulse Width: 0.5 ms
Lead Channel Pacing Threshold Pulse Width: 0.5 ms
Lead Channel Sensing Intrinsic Amplitude: 12 mV
Lead Channel Sensing Intrinsic Amplitude: 5 mV
Lead Channel Setting Pacing Amplitude: 2 V
Pulse Gen Model: 2272
Pulse Gen Serial Number: 8957639

## 2023-06-21 NOTE — Patient Instructions (Signed)
  Medication Instructions:   Your physician recommends that you continue on your current medications as directed. Please refer to the Current Medication list given to you today.   *If you need a refill on your cardiac medications before your next appointment, please call your pharmacy*    Lab Work:   PLEASE GO DOWN STAIRS  [facility]  FIRST FLOOR   ( GET OFF ELEVATORS WALK TOWARDS WAITING AREA LAB LOCATED BY PHARMACY):  BMET AND CBC TODAY     If you have labs (blood work) drawn today and your tests are completely normal, you will receive your results only by: MyChart Message (if you have MyChart) OR A paper copy in the mail If you have any lab test that is abnormal or we need to change your treatment, we will call you to review the results.    Testing/Procedures: Your physician has requested that you have an echocardiogram. Echocardiography is a painless test that uses sound waves to create images of your heart. It provides your doctor with information about the size and shape of your heart and how well your heart's chambers and valves are working. This procedure takes approximately one hour. There are no restrictions for this procedure. Please do NOT wear cologne, perfume, aftershave, or lotions (deodorant is allowed). Please arrive 15 minutes prior to your appointment time.  Please note: We ask at that you not bring children with you during ultrasound (echo/ vascular) testing. Due to room size and safety concerns, children are not allowed in the ultrasound rooms during exams. Our front office staff cannot provide observation of children in our lobby area while testing is being conducted. An adult accompanying a patient to their appointment will only be allowed in the ultrasound room at the discretion of the ultrasound technician under special circumstances. We apologize for any inconvenience.   Follow-Up: At [facility], you and your health needs are our priority.  As part of  our continuing mission to provide you with exceptional heart care, our providers are all part of one team.  This team includes your primary Cardiologist (physician) and Advanced Practice Providers or APPs (Physician Assistants and Nurse Practitioners) who all work together to provide you with the care you need, when you need it.  Your next appointment:    4 month(s) ( CONTACT  CASSIE HALL/ ANGELINE HAMMER FOR EP SCHEDULING ISSUES )    Provider:  Mertha Abrahams, PA-C   We recommend signing up for the patient portal called "MyChart".  Sign up information is provided on this After Visit Summary.  MyChart is used to connect with patients for Virtual Visits (Telemedicine).  Patients are able to view lab/test results, encounter notes, upcoming appointments, etc.  Non-urgent messages can be sent to your provider as well.   To learn more about what you can do with MyChart, go to [email or web address].     Other Instructions

## 2023-06-22 LAB — BASIC METABOLIC PANEL WITH GFR
BUN/Creatinine Ratio: 13 (ref 12–28)
BUN: 13 mg/dL (ref 8–27)
CO2: 24 mmol/L (ref 20–29)
Calcium: 9.3 mg/dL (ref 8.7–10.3)
Chloride: 100 mmol/L (ref 96–106)
Creatinine, Ser: 0.98 mg/dL (ref 0.57–1.00)
Glucose: 99 mg/dL (ref 70–99)
Potassium: 3.8 mmol/L (ref 3.5–5.2)
Sodium: 140 mmol/L (ref 134–144)
eGFR: 64 mL/min/{1.73_m2} (ref >59)

## 2023-06-22 LAB — CBC
Hematocrit: 40.4 % (ref 34.0–46.6)
Hemoglobin: 13.2 g/dL (ref 11.1–15.9)
MCH: 28.8 pg (ref 26.6–33.0)
MCHC: 32.7 g/dL (ref 31.5–35.7)
MCV: 88 fL (ref 79–97)
Platelets: 261 10*3/uL (ref 150–450)
RBC: 4.58 x10E6/uL (ref 3.77–5.28)
RDW: 13.1 % (ref 11.7–15.4)
WBC: 8.8 10*3/uL (ref 3.4–10.8)

## 2023-06-23 ENCOUNTER — Ambulatory Visit: Payer: Self-pay | Admitting: Cardiology

## 2023-07-02 ENCOUNTER — Other Ambulatory Visit: Payer: Self-pay | Admitting: Physician Assistant

## 2023-08-05 ENCOUNTER — Ambulatory Visit: Payer: Medicare HMO

## 2023-08-05 DIAGNOSIS — I495 Sick sinus syndrome: Secondary | ICD-10-CM | POA: Diagnosis not present

## 2023-08-06 ENCOUNTER — Ambulatory Visit: Payer: Self-pay | Admitting: Cardiology

## 2023-08-06 LAB — CUP PACEART REMOTE DEVICE CHECK
Battery Remaining Longevity: 57 mo
Battery Remaining Percentage: 48 %
Battery Voltage: 2.98 V
Brady Statistic RA Percent Paced: 98 %
Date Time Interrogation Session: 20250714112853
Implantable Lead Connection Status: 753985
Implantable Lead Connection Status: 753985
Implantable Lead Implant Date: 20181018
Implantable Lead Implant Date: 20181018
Implantable Lead Location: 753859
Implantable Lead Location: 753860
Implantable Pulse Generator Implant Date: 20181018
Lead Channel Impedance Value: 400 Ohm
Lead Channel Pacing Threshold Amplitude: 0.5 V
Lead Channel Pacing Threshold Pulse Width: 0.5 ms
Lead Channel Sensing Intrinsic Amplitude: 5 mV
Lead Channel Setting Pacing Amplitude: 2 V
Pulse Gen Model: 2272
Pulse Gen Serial Number: 8957639

## 2023-08-08 ENCOUNTER — Ambulatory Visit (HOSPITAL_COMMUNITY)
Admission: RE | Admit: 2023-08-08 | Discharge: 2023-08-08 | Disposition: A | Discharge status: Home / Self Care | Source: Ambulatory Visit | Attending: Cardiology | Admitting: Cardiology

## 2023-08-08 DIAGNOSIS — R0789 Other chest pain: Secondary | ICD-10-CM | POA: Diagnosis present

## 2023-08-08 LAB — ECHOCARDIOGRAM COMPLETE
AR max vel: 2.89 cm2
AV Area VTI: 2.79 cm2
AV Area mean vel: 2.72 cm2
AV Mean grad: 6 mmHg
AV Peak grad: 11.7 mmHg
Ao pk vel: 1.71 m/s
Area-P 1/2: 3.13 cm2
S' Lateral: 3.1 cm

## 2023-10-04 ENCOUNTER — Ambulatory Visit: Attending: Physician Assistant | Admitting: Physician Assistant

## 2023-10-04 ENCOUNTER — Encounter: Payer: Self-pay | Admitting: Physician Assistant

## 2023-10-04 VITALS — BP 172/104 | HR 63 | Ht 69.5 in | Wt 249.6 lb

## 2023-10-04 DIAGNOSIS — I10 Essential (primary) hypertension: Secondary | ICD-10-CM

## 2023-10-04 DIAGNOSIS — I428 Other cardiomyopathies: Secondary | ICD-10-CM | POA: Diagnosis not present

## 2023-10-04 DIAGNOSIS — Z950 Presence of cardiac pacemaker: Secondary | ICD-10-CM | POA: Diagnosis not present

## 2023-10-04 DIAGNOSIS — R001 Bradycardia, unspecified: Secondary | ICD-10-CM

## 2023-10-04 DIAGNOSIS — I420 Dilated cardiomyopathy: Secondary | ICD-10-CM | POA: Diagnosis not present

## 2023-10-04 DIAGNOSIS — I480 Paroxysmal atrial fibrillation: Secondary | ICD-10-CM

## 2023-10-04 DIAGNOSIS — R0789 Other chest pain: Secondary | ICD-10-CM | POA: Diagnosis not present

## 2023-10-04 DIAGNOSIS — D6869 Other thrombophilia: Secondary | ICD-10-CM

## 2023-10-04 LAB — CUP PACEART INCLINIC DEVICE CHECK
Battery Remaining Longevity: 52 mo
Battery Voltage: 2.98 V
Brady Statistic RA Percent Paced: 97 %
Brady Statistic RV Percent Paced: 0 %
Date Time Interrogation Session: 20250912171010
Implantable Lead Connection Status: 753985
Implantable Lead Connection Status: 753985
Implantable Lead Implant Date: 20181018
Implantable Lead Implant Date: 20181018
Implantable Lead Location: 753859
Implantable Lead Location: 753860
Implantable Pulse Generator Implant Date: 20181018
Lead Channel Impedance Value: 400 Ohm
Lead Channel Pacing Threshold Amplitude: 0.625 V
Lead Channel Pacing Threshold Pulse Width: 0.5 ms
Lead Channel Sensing Intrinsic Amplitude: 12 mV
Lead Channel Sensing Intrinsic Amplitude: 5 mV
Lead Channel Setting Pacing Amplitude: 2 V
Pulse Gen Model: 2272
Pulse Gen Serial Number: 8957639
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
## 2023-10-04 NOTE — Patient Instructions (Signed)
 Medication Instructions:   Your physician recommends that you continue on your current medications as directed. Please refer to the Current Medication list given to you today.   *If you need a refill on your cardiac medications before your next appointment, please call your pharmacy*    Lab Work: NONE ORDERED  TODAY     If you have labs (blood work) drawn today and your tests are completely normal, you will receive your results only by: MyChart Message (if you have MyChart) OR A paper copy in the mail If you have any lab test that is abnormal or we need to change your treatment, we will call you to review the results.    Testing/Procedures: NONE ORDERED  TODAY     Follow-Up: At [facility], you and your health needs are our priority.  As part of our continuing mission to provide you with exceptional heart care, our providers are all part of one team.  This team includes your primary Cardiologist (physician) and Advanced Practice Providers or APPs (Physician Assistants and Nurse Practitioners) who all work together to provide you with the care you need, when you need it.  Your next appointment:    6 month(s)   Provider:    You may see  Dr Inocencio  or one of the following Advanced Practice Providers on your designated Care Team:   Charlies Arthur, [place]    We recommend signing up for the patient portal called MyChart.  Sign up information is provided on this After Visit Summary.  MyChart is used to connect with patients for Virtual Visits (Telemedicine).  Patients are able to view lab/test results, encounter notes, upcoming appointments, etc.  Non-urgent messages can be sent to your provider as well.   To learn more about what you can do with MyChart, go to [email or web address].   Other Instructions

## 2023-10-08 ENCOUNTER — Ambulatory Visit: Payer: Self-pay | Admitting: Cardiology

## 2023-10-31 NOTE — Progress Notes (Signed)
 Remote PPM Transmission

## 2023-11-04 ENCOUNTER — Ambulatory Visit: Payer: Medicare HMO

## 2023-11-04 DIAGNOSIS — I428 Other cardiomyopathies: Secondary | ICD-10-CM | POA: Diagnosis not present

## 2023-11-04 LAB — CUP PACEART REMOTE DEVICE CHECK
Battery Remaining Longevity: 55 mo
Battery Remaining Percentage: 45 %
Battery Voltage: 2.98 V
Brady Statistic RA Percent Paced: 98 %
Date Time Interrogation Session: 20251013020014
Implantable Lead Connection Status: 753985
Implantable Lead Connection Status: 753985
Implantable Lead Implant Date: 20181018
Implantable Lead Implant Date: 20181018
Implantable Lead Location: 753859
Implantable Lead Location: 753860
Implantable Pulse Generator Implant Date: 20181018
Lead Channel Impedance Value: 410 Ohm
Lead Channel Pacing Threshold Amplitude: 0.5 V
Lead Channel Pacing Threshold Pulse Width: 0.5 ms
Lead Channel Sensing Intrinsic Amplitude: 5 mV
Lead Channel Setting Pacing Amplitude: 2 V
Pulse Gen Model: 2272
Pulse Gen Serial Number: 8957639

## 2023-11-05 NOTE — Progress Notes (Signed)
 Remote PPM Transmission

## 2023-11-15 ENCOUNTER — Ambulatory Visit: Payer: Self-pay | Admitting: Cardiology

## 2024-01-31 ENCOUNTER — Other Ambulatory Visit: Payer: Self-pay | Admitting: Physician Assistant

## 2024-01-31 ENCOUNTER — Telehealth: Payer: Self-pay | Admitting: Physician Assistant

## 2024-01-31 MED ORDER — ATORVASTATIN CALCIUM 40 MG PO TABS: 40.0000 mg | ORAL_TABLET | Freq: Every day | ORAL | 2 refills | Status: AC

## 2024-01-31 NOTE — Telephone Encounter (Signed)
" °*  STAT* If patient is at the pharmacy, call can be transferred to refill team.   1. Which medications need to be refilled? (please list name of each medication and dose if known) atorvastatin  (LIPITOR) 40 MG tablet    2. Would you like to learn more about the convenience, safety, & potential cost savings by using the [facility]? No    3. Are you open to using the [facility] (Type [facility]. ). No   4. Which pharmacy/location (including street and city if local pharmacy) is medication to be sent to?[facility] - [place], [place] - [address]    5. Do they need a 30 day or 90 day supply? 90  Pt is currently out of medication   "

## 2024-02-03 ENCOUNTER — Ambulatory Visit: Payer: Medicare HMO

## 2024-02-03 DIAGNOSIS — I428 Other cardiomyopathies: Secondary | ICD-10-CM | POA: Diagnosis not present

## 2024-02-04 ENCOUNTER — Ambulatory Visit: Payer: Self-pay | Admitting: Cardiology

## 2024-02-04 LAB — CUP PACEART REMOTE DEVICE CHECK
Battery Remaining Longevity: 53 mo
Battery Remaining Percentage: 43 %
Battery Voltage: 2.98 V
Brady Statistic RA Percent Paced: 96 %
Date Time Interrogation Session: 20260112020020
Implantable Lead Connection Status: 753985
Implantable Lead Connection Status: 753985
Implantable Lead Implant Date: 20181018
Implantable Lead Implant Date: 20181018
Implantable Lead Location: 753859
Implantable Lead Location: 753860
Implantable Pulse Generator Implant Date: 20181018
Lead Channel Impedance Value: 430 Ohm
Lead Channel Pacing Threshold Amplitude: 0.5 V
Lead Channel Pacing Threshold Pulse Width: 0.5 ms
Lead Channel Sensing Intrinsic Amplitude: 5 mV
Lead Channel Setting Pacing Amplitude: 2 V
Pulse Gen Model: 2272
Pulse Gen Serial Number: 8957639

## 2024-02-05 NOTE — Progress Notes (Signed)
 Remote PPM Transmission
